# Patient Record
Sex: Female | Born: 1970 | Race: White | Hispanic: No | Marital: Single | Smoking: Current every day smoker
Health system: Southern US, Community
[De-identification: ages and names within clinical notes are randomized; demographics above are authoritative.]

## PROBLEM LIST (undated history)

## (undated) DIAGNOSIS — G459 Transient cerebral ischemic attack, unspecified: Secondary | ICD-10-CM

## (undated) DIAGNOSIS — F319 Bipolar disorder, unspecified: Secondary | ICD-10-CM

## (undated) DIAGNOSIS — I8290 Acute embolism and thrombosis of unspecified vein: Secondary | ICD-10-CM

## (undated) DIAGNOSIS — I82C19 Acute embolism and thrombosis of unspecified internal jugular vein: Secondary | ICD-10-CM

## (undated) HISTORY — PX: OTHER SURGICAL HISTORY: SHX169

## (undated) HISTORY — DX: Acute embolism and thrombosis of unspecified internal jugular vein: I82.C19

---

## 2005-12-30 ENCOUNTER — Emergency Department: Payer: Self-pay | Admitting: Emergency Medicine

## 2007-01-25 ENCOUNTER — Emergency Department: Payer: Self-pay | Admitting: Emergency Medicine

## 2008-09-12 ENCOUNTER — Emergency Department: Payer: Self-pay | Admitting: Emergency Medicine

## 2009-02-04 ENCOUNTER — Emergency Department: Payer: Self-pay | Admitting: Unknown Physician Specialty

## 2009-11-22 ENCOUNTER — Ambulatory Visit: Payer: Self-pay | Admitting: Obstetrics and Gynecology

## 2009-12-07 ENCOUNTER — Ambulatory Visit: Payer: Self-pay | Admitting: Obstetrics and Gynecology

## 2009-12-07 ENCOUNTER — Encounter (INDEPENDENT_AMBULATORY_CARE_PROVIDER_SITE_OTHER): Payer: Self-pay | Admitting: *Deleted

## 2009-12-07 LAB — CONVERTED CEMR LAB
Hemoglobin: 12 g/dL (ref 12.0–15.0)
MCHC: 32.9 g/dL (ref 30.0–36.0)
MCV: 95.3 fL (ref 78.0–100.0)
RBC: 3.83 M/uL — ABNORMAL LOW (ref 3.87–5.11)

## 2009-12-13 ENCOUNTER — Ambulatory Visit (HOSPITAL_COMMUNITY)
Admission: RE | Admit: 2009-12-13 | Discharge: 2009-12-13 | Payer: Self-pay | Source: Home / Self Care | Admitting: Family Medicine

## 2009-12-27 ENCOUNTER — Ambulatory Visit: Payer: Self-pay | Admitting: Obstetrics and Gynecology

## 2010-03-14 ENCOUNTER — Ambulatory Visit: Payer: Self-pay

## 2012-05-20 ENCOUNTER — Emergency Department: Payer: Self-pay | Admitting: Unknown Physician Specialty

## 2012-07-29 ENCOUNTER — Ambulatory Visit: Payer: Self-pay

## 2012-08-05 ENCOUNTER — Ambulatory Visit: Payer: Self-pay

## 2012-08-05 ENCOUNTER — Ambulatory Visit: Payer: Self-pay | Admitting: Family Medicine

## 2012-10-05 ENCOUNTER — Emergency Department: Payer: Self-pay | Admitting: Emergency Medicine

## 2013-07-28 ENCOUNTER — Emergency Department: Payer: Self-pay | Admitting: Emergency Medicine

## 2013-07-28 LAB — CBC
HCT: 36.8 % (ref 35.0–47.0)
HGB: 12.3 g/dL (ref 12.0–16.0)
MCH: 31.8 pg (ref 26.0–34.0)
MCHC: 33.3 g/dL (ref 32.0–36.0)
MCV: 95 fL (ref 80–100)
PLATELETS: 263 10*3/uL (ref 150–440)
RBC: 3.86 10*6/uL (ref 3.80–5.20)
RDW: 14.2 % (ref 11.5–14.5)
WBC: 5.8 10*3/uL (ref 3.6–11.0)

## 2013-07-28 LAB — BASIC METABOLIC PANEL
ANION GAP: 5 — AB (ref 7–16)
BUN: 15 mg/dL (ref 7–18)
CALCIUM: 8.9 mg/dL (ref 8.5–10.1)
CO2: 25 mmol/L (ref 21–32)
Chloride: 107 mmol/L (ref 98–107)
Creatinine: 0.91 mg/dL (ref 0.60–1.30)
GLUCOSE: 96 mg/dL (ref 65–99)
OSMOLALITY: 275 (ref 275–301)
POTASSIUM: 3.8 mmol/L (ref 3.5–5.1)
Sodium: 137 mmol/L (ref 136–145)

## 2013-07-28 LAB — TROPONIN I

## 2014-09-06 ENCOUNTER — Emergency Department
Admission: EM | Admit: 2014-09-06 | Discharge: 2014-09-06 | Disposition: A | Discharge status: Home / Self Care | Payer: Self-pay | Attending: Emergency Medicine | Admitting: Emergency Medicine

## 2014-09-06 DIAGNOSIS — F1994 Other psychoactive substance use, unspecified with psychoactive substance-induced mood disorder: Secondary | ICD-10-CM

## 2014-09-06 DIAGNOSIS — F329 Major depressive disorder, single episode, unspecified: Secondary | ICD-10-CM | POA: Insufficient documentation

## 2014-09-06 DIAGNOSIS — F1410 Cocaine abuse, uncomplicated: Secondary | ICD-10-CM | POA: Insufficient documentation

## 2014-09-06 DIAGNOSIS — Y9289 Other specified places as the place of occurrence of the external cause: Secondary | ICD-10-CM | POA: Insufficient documentation

## 2014-09-06 DIAGNOSIS — Z720 Tobacco use: Secondary | ICD-10-CM | POA: Insufficient documentation

## 2014-09-06 DIAGNOSIS — Z7289 Other problems related to lifestyle: Secondary | ICD-10-CM

## 2014-09-06 DIAGNOSIS — F1010 Alcohol abuse, uncomplicated: Secondary | ICD-10-CM

## 2014-09-06 DIAGNOSIS — Y9389 Activity, other specified: Secondary | ICD-10-CM | POA: Insufficient documentation

## 2014-09-06 DIAGNOSIS — F10920 Alcohol use, unspecified with intoxication, uncomplicated: Secondary | ICD-10-CM

## 2014-09-06 DIAGNOSIS — Z3202 Encounter for pregnancy test, result negative: Secondary | ICD-10-CM | POA: Insufficient documentation

## 2014-09-06 DIAGNOSIS — F10120 Alcohol abuse with intoxication, uncomplicated: Secondary | ICD-10-CM | POA: Insufficient documentation

## 2014-09-06 DIAGNOSIS — F341 Dysthymic disorder: Secondary | ICD-10-CM

## 2014-09-06 DIAGNOSIS — Y998 Other external cause status: Secondary | ICD-10-CM | POA: Insufficient documentation

## 2014-09-06 DIAGNOSIS — S61512A Laceration without foreign body of left wrist, initial encounter: Secondary | ICD-10-CM | POA: Insufficient documentation

## 2014-09-06 DIAGNOSIS — F32A Depression, unspecified: Secondary | ICD-10-CM

## 2014-09-06 HISTORY — DX: Bipolar disorder, unspecified: F31.9

## 2014-09-06 LAB — CBC
HEMATOCRIT: 40.1 % (ref 35.0–47.0)
Hemoglobin: 13.4 g/dL (ref 12.0–16.0)
MCH: 31.5 pg (ref 26.0–34.0)
MCHC: 33.4 g/dL (ref 32.0–36.0)
MCV: 94.3 fL (ref 80.0–100.0)
Platelets: 210 10*3/uL (ref 150–440)
RBC: 4.25 MIL/uL (ref 3.80–5.20)
RDW: 13.9 % (ref 11.5–14.5)
WBC: 6.1 10*3/uL (ref 3.6–11.0)

## 2014-09-06 LAB — COMPREHENSIVE METABOLIC PANEL
ALBUMIN: 4.3 g/dL (ref 3.5–5.0)
ALT: 17 U/L (ref 14–54)
ANION GAP: 9 (ref 5–15)
AST: 23 U/L (ref 15–41)
Alkaline Phosphatase: 54 U/L (ref 38–126)
BUN: 10 mg/dL (ref 6–20)
CALCIUM: 8.9 mg/dL (ref 8.9–10.3)
CO2: 24 mmol/L (ref 22–32)
CREATININE: 0.82 mg/dL (ref 0.44–1.00)
Chloride: 106 mmol/L (ref 101–111)
GLUCOSE: 90 mg/dL (ref 65–99)
POTASSIUM: 3.5 mmol/L (ref 3.5–5.1)
Sodium: 139 mmol/L (ref 135–145)
Total Bilirubin: 0.2 mg/dL — ABNORMAL LOW (ref 0.3–1.2)
Total Protein: 7.5 g/dL (ref 6.5–8.1)

## 2014-09-06 LAB — URINE DRUG SCREEN, QUALITATIVE (ARMC ONLY)
Amphetamines, Ur Screen: NOT DETECTED
Barbiturates, Ur Screen: NOT DETECTED
Benzodiazepine, Ur Scrn: NOT DETECTED
Cannabinoid 50 Ng, Ur ~~LOC~~: NOT DETECTED
Cocaine Metabolite,Ur ~~LOC~~: POSITIVE — AB
MDMA (Ecstasy)Ur Screen: NOT DETECTED
METHADONE SCREEN, URINE: NOT DETECTED
Opiate, Ur Screen: NOT DETECTED
PHENCYCLIDINE (PCP) UR S: NOT DETECTED
TRICYCLIC, UR SCREEN: NOT DETECTED

## 2014-09-06 LAB — ETHANOL: Alcohol, Ethyl (B): 213 mg/dL — ABNORMAL HIGH (ref <5)

## 2014-09-06 LAB — ACETAMINOPHEN LEVEL

## 2014-09-06 LAB — POCT PREGNANCY, URINE: Preg Test, Ur: NEGATIVE

## 2014-09-06 LAB — SALICYLATE LEVEL: Salicylate Lvl: <4 mg/dL (ref 2.8–30.0)

## 2014-09-06 NOTE — ED Notes (Signed)
Patient given instructions about following up with [facility] and given info sheet with hours and phone numbers as well as crisis phone numbers.  Patient verbalized that she has no intention to hurt herself at this time or in the near future.  Patient reports willingness to follow-up and receive counseling regarding depression and alcohol abuse.

## 2014-09-06 NOTE — ED Provider Notes (Signed)
[facility] Emergency Department Provider Note  ____________________________________________  Time seen: Approximately 138 AM  I have reviewed the triage vital signs and the nursing notes.   HISTORY  Chief Complaint Alcohol Intoxication  The patient is noncooperative with the history.  HPI Nicole Macdonald is a 44 y.o. female who was brought in by EMS for possible suicide attempt. The patient was found with a razor blade and multiple superficial lacerations to her left wrist. The patient was drinking alcohol this evening. She was found by her boyfriend who was given a bring her in but the patient ran into the bathroom and the police was called. The patient will not speak and is not cooperative at this time.   Past Medical History  Diagnosis Date  . Bipolar 1 disorder   . Manic depression     There are no active problems to display for this patient.   Past Surgical History  Procedure Laterality Date  . Denies      Current Outpatient Rx  Name  Route  Sig  Dispense  Refill  . clonazePAM (KLONOPIN) 2 MG tablet   Oral   Take 2 mg by mouth QID.         Marland Kitchen FLUoxetine (PROZAC) 20 MG capsule   Oral   Take 20 mg by mouth daily.           Allergies Review of patient's allergies indicates no known allergies.  No family history on file.  Social History History  Substance Use Topics  . Smoking status: Current Every Day Smoker  . Smokeless tobacco: Not on file  . Alcohol Use: Yes    Review of Systems Skin: Lacerations to left wrist   Patient not cooperative and refuses to answer questions at this time..  ____________________________________________   PHYSICAL EXAM:  VITAL SIGNS: ED Triage Vitals  Enc Vitals Group     BP 09/06/14 0150 109/85 mmHg     Pulse Rate 09/06/14 0150 88     Resp 09/06/14 0150 20     Temp 09/06/14 0150 97.3 F (36.3 C)     Temp Source 09/06/14 0150 Oral     SpO2 09/06/14 0150 99 %     Weight 09/06/14  0150 115 lb (52.164 kg)     Height 09/06/14 0150  (1.6 m)     Head Cir --      Peak Flow --      Pain Score --      Pain Loc --      Pain Edu? --      Excl. in GC? --    Constitutional: Alert . Well appearing and in no acute distress. Eyes: Conjunctivae are normal. PERRL. EOMI. Head: Atraumatic. Nose: No congestion/rhinnorhea. Mouth/Throat: Mucous membranes are moist.  Oropharynx non-erythematous. Cardiovascular: Normal rate, regular rhythm. Grossly normal heart sounds.  Good peripheral circulation. Respiratory: Normal respiratory effort.  No retractions. Lungs CTAB. Gastrointestinal: Soft and nontender. No distention. Positive bowel sounds Musculoskeletal: No lower extremity tenderness nor edema.   Neurologic:  No gross focal neurologic deficits are appreciated.  Skin:  Multiple Superficial lacerations to left wrist with some mild bleeding Psychiatric: Patient with downcast eyes not speaking and refusing to participate in history and physical exam.  ____________________________________________   LABS (all labs ordered are listed, but only abnormal results are displayed)  Labs Reviewed  URINE DRUG SCREEN, QUALITATIVE ([facility] ONLY) - Abnormal; Notable for the following:    Cocaine Metabolite,Ur Heard POSITIVE (*)  All other components within normal limits  COMPREHENSIVE METABOLIC PANEL  ETHANOL  SALICYLATE LEVEL  ACETAMINOPHEN LEVEL  CBC  POC URINE PREG, ED  POCT PREGNANCY, URINE   ____________________________________________  EKG  None ____________________________________________  RADIOLOGY  None ____________________________________________   PROCEDURES  Procedure(s) performed: None  Critical Care performed: No  ____________________________________________   INITIAL IMPRESSION / ASSESSMENT AND PLAN / ED COURSE  Pertinent labs & imaging results that were available during my care of the patient were reviewed by me and considered in my medical decision  making (see chart for details).  This is a 44 year old female who was brought in by ambulance after being found with a razor in her hand by her boyfriend. The patient will not answer questions as to whether she is suicidal but she was brought in under involuntary commitment by EMS and the police.  The patient will be evaluated by psych. ____________________________________________   FINAL CLINICAL IMPRESSION(S) / ED DIAGNOSES  Final diagnoses:  None      Rebecka Apley, MD 09/06/14 [phone]

## 2014-09-06 NOTE — BH Assessment (Signed)
Assessment Note  Nicole Macdonald is an 44 y.o. female.  "I had a breakdown". She reports, "I am on Prozac, they gave a different brand. I'm not supposed to drink with my medication."  "I had about 5 beers. I also had a fight with my best friend and I had a bad reaction. The medication makes me mean". She denied being depressed at this time, though she states she and her friend are having an argument.  She denied having auditory or visual hallucinations.  She denied suicidal and homicidal ideation or intent.  She reports drinking 5-6 beers about 2 times a week.  She is reported as slicing her wrists and had cuts on her wrists, but patient declined to talk about the cuts.   Axis I: Bipolar, mixed Axis II: Deferred Axis III:  Past Medical History  Diagnosis Date  . Bipolar 1 disorder   . Manic depression    Axis IV: problems related to legal system/crime and problems with primary support group Axis V: 11-20 some danger of hurting self or others possible OR occasionally fails to maintain minimal personal hygiene OR gross impairment in communication  Past Medical History:  Past Medical History  Diagnosis Date  . Bipolar 1 disorder   . Manic depression     Past Surgical History  Procedure Laterality Date  . Denies      Family History: No family history on file.  Social History:  reports that she has been smoking.  She does not have any smokeless tobacco history on file. She reports that she drinks alcohol. Her drug history is not on file.  Additional Social History:  Alcohol / Drug Use History of alcohol / drug use?: Yes Negative Consequences of Use:  (None reported) Withdrawal Symptoms:  (None reported) Substance #1 Name of Substance 1: Alcohol 1 - Age of First Use: 12 1 - Amount (size/oz): 5-6 beers  1 - Frequency:  2 times a week 1 - Last Use / Amount: 09/05/2014  CIWA: CIWA-Ar BP: 109/85 mmHg Pulse Rate: 88 COWS:    Allergies: No Known Allergies  Home Medications:   (Not in a hospital admission)  OB/GYN Status:  No LMP recorded.  General Assessment Data Location of Assessment: [facility] ED TTS Assessment: In system Is this a Tele or Face-to-Face Assessment?: Face-to-Face Is this an Initial Assessment or a Re-assessment for this encounter?: Initial Assessment Marital status: Single Maiden name: Ulbricht Is patient pregnant?: No Pregnancy Status: No Living Arrangements: Non-relatives/Friends (fiance) Can pt return to current living arrangement?: Yes Is patient capable of signing voluntary admission?: Yes Referral Source: MD Insurance type: Self pay  Medical Screening Exam [facility] ONLY) Medical Exam completed: Yes  Crisis Care Plan Living Arrangements: Non-relatives/Friends (fiance) Name of Psychiatrist: None Name of Therapist: None  Education Status Is patient currently in school?: No Current Grade: N/a Highest grade of school patient has completed:  (some college) Name of school: [employer] person: n/a  Risk to self with the past 6 months Suicidal Ideation: No Has patient been a risk to self within the past 6 months prior to admission? : Yes Suicidal Intent: Yes-Currently Present (Denies suicidal ideation) Has patient had any suicidal intent within the past 6 months prior to admission? : Yes Is patient at risk for suicide?: Yes Suicidal Plan?: Yes-Currently Present Has patient had any suicidal plan within the past 6 months prior to admission? : Yes Specify Current Suicidal Plan: cutting wrists Access to Means: Yes Specify Access to Suicidal Means:  Access to sharp objects What has been your use of drugs/alcohol within the last 12 months?: 5-6 beers twice a week Previous Attempts/Gestures: No (denied) How many times?: 0 Other Self Harm Risks: None reported Triggers for Past Attempts: Unknown Intentional Self Injurious Behavior: Cutting Family Suicide History: No Recent stressful life event(s): Other (Comment) (Medication  changes) Persecutory voices/beliefs?: No Depression: No Depression Symptoms:  (None reported) Substance abuse history and/or treatment for substance abuse?: No Suicide prevention information given to non-admitted patients: Not applicable (Not at this time-to be seen by psychiatrist)  Risk to Others within the past 6 months Homicidal Ideation: No Does patient have any lifetime risk of violence toward others beyond the six months prior to admission? : No Thoughts of Harm to Others: No Current Homicidal Intent: No Current Homicidal Plan: No Access to Homicidal Means: No Identified Victim: None reported History of harm to others?: No Assessment of Violence: On admission Violent Behavior Description: None reported Does patient have access to weapons?: No Criminal Charges Pending?: No Does patient have a court date: No Is patient on probation?: Yes (DUI)  Psychosis Hallucinations: None noted Delusions: None noted  Mental Status Report Appearance/Hygiene: Disheveled, In scrubs Eye Contact: Poor Motor Activity: Unremarkable Speech: Tangential Level of Consciousness: Alert Mood: Euthymic Affect: Flat Anxiety Level: None Thought Processes: Flight of Ideas Judgement: Unable to Assess Orientation: Person, Place, Situation Obsessive Compulsive Thoughts/Behaviors: None  Cognitive Functioning Concentration: Fair Appetite: Good  ADLScreening ([facility] Assessment Services) Patient's cognitive ability adequate to safely complete daily activities?: Yes Patient able to express need for assistance with ADLs?: Yes Independently performs ADLs?: Yes (appropriate for developmental age)  Prior Inpatient Therapy Prior Inpatient Therapy: No  Prior Outpatient Therapy Prior Outpatient Therapy: No Does patient have an ACCT team?: No Does patient have Intensive In-House Services?  : No Does patient have [facility] services? : No Does patient have P4CC services?: No  ADL Screening (condition at  time of admission) Patient's cognitive ability adequate to safely complete daily activities?: Yes Patient able to express need for assistance with ADLs?: Yes Independently performs ADLs?: Yes (appropriate for developmental age)       Abuse/Neglect Assessment (Assessment to be complete while patient is alone) Physical Abuse: Denies Verbal Abuse: Denies Sexual Abuse: Denies Exploitation of patient/patient's resources: Denies Self-Neglect: Denies Values / Beliefs Cultural Requests During Hospitalization: None Spiritual Requests During Hospitalization: None   Advance Directives (For Healthcare) Does patient have an advance directive?: No Would patient like information on creating an advanced directive?: No - patient declined information    Additional Information CIRT Risk: No Does patient have medical clearance?: Yes     Disposition:  Disposition Initial Assessment Completed for this Encounter: Yes Disposition of Patient: Referred to (To be seen by the psychiatrist)  On Site Evaluation by:   Reviewed with Physician:    Justice Deeds 09/06/2014 5:43 AM

## 2014-09-06 NOTE — ED Notes (Signed)

## 2014-09-06 NOTE — ED Notes (Signed)

## 2014-09-06 NOTE — ED Notes (Signed)

## 2014-09-06 NOTE — ED Notes (Signed)
BEHAVIORAL HEALTH ROUNDING Patient sleeping: YES Patient alert and oriented:Sleeping Behavior appropriate: Sleeping Describe behavior: No inappropriate or unacceptable behaviors noted at this time.  Nutrition and fluids offered: Sleeping Toileting and hygiene offered: Sleeping Sitter present: Behavioral tech rounding every 15 minutes on patient to ensure safety.  Law enforcement present: yes Patent examiner agency: [employer]

## 2014-09-06 NOTE — Progress Notes (Signed)
LCSW met with patient to find out why and how she arrived in our ER and if she is suicidal. 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 She reported she did drink alcohol and do some cocaine but stressed this is not an everyday thing and does not feel this is problematic for her. She does feel that her medications ( prozac and clonapin) need to be reviewed. She stated she has a lot of racing thoughts recently ( like brain changing channels all the time). Patient is seen by Dr Robina Ade in [place] and her next appointment is in 2 weeks. She reports she takes her medication as prescribed. She has 2 grown children who live away from home and she lives with her exhusband/ boyfriend of 25 years. They reconnected and  Has been together as a couple again for 5 years and she feels supported and loved. She reported she is not suicidal or homicidal, her boyfriend panicked when she locked herself in bathroom then passed out. He called police and she came here

## 2014-09-06 NOTE — ED Provider Notes (Addendum)
-----------------------------------------   8:06 AM on 09/06/2014 -----------------------------------------   Blood pressure 101/70, pulse 88, temperature 98.3 F (36.8 C), temperature source Oral, resp. rate 20, height  (1.6 m), weight 115 lb (52.164 kg), SpO2 98 %.  The patient had no acute events since last update.  Calm and cooperative at this time.  Disposition is pending per Psychiatry/Behavioral Medicine team recommendations.     Arnaldo Natal, MD 09/06/14 [phone] Patient cleared seen and cleared by psychiatry discharge with follow-up with her regular psychiatrist  Arnaldo Natal, MD 09/06/14 1145

## 2014-09-06 NOTE — Consult Note (Signed)
[facility] Consult   Reason for Consult:  Consult for 44 year old woman with a history of chronic depression and alcohol abuse who came in after cutting her wrists Referring Physician:  Cinda Quest Patient Identification: Nicole Macdonald MRN:  [id number] Principal Diagnosis: Substance induced mood disorder Diagnosis:   Patient Active Problem List   Diagnosis Date Noted  . Alcohol abuse [F10.10] 09/06/2014  . Substance induced mood disorder [F19.94] 09/06/2014    Total Time spent with patient: 1 hour  Subjective:   Nicole Macdonald is a 44 y.o. female patient admitted with "I had too much to drink" also "that new brand of medicine doesn't work".  HPI:  Information from the patient and the chart. Patient was brought in after cutting her wrist while intoxicated. Patient denies that she was seriously trying to kill her self. She said that she had too much to drink yesterday which was unusual for her.she also had an argument with her close friend and was feeling very stressed out. She estimates that she drank about 12 beers yesterday. Usually doesn't drink that much. The patient also states that she thinks that her current Prozac is "not working". She insists that she has been switched to a different brand of Prozac and that it doesn't feel like it's treating her depression as well as it used to. Her mood has been a little bit more labile. She's been sleeping poorly and had poor appetite. No hallucinations no psychotic symptoms. She admits also that she used cocaine 1 time yesterday.  Past psychiatric history: Patient reports 1 prior suicide attempt as a teenager and none since then. No history of inpatient psychiatric treatment. She isn't patient of Dr.Kaur in [place] and sees her regularly. Previously had felt like Prozac and Klonopin were helping but recently they seem to be less helpful than they were. She does take clonazepam 2 mg 4 times a day also though she says that she  takes less of it when she is drinking.  Substance abuse history: Intermittent alcohol abuse. She minimizes the effect of it. Doesn't really see it as being as much of active problem. No history of DTs or seizures.  Medical history: No significant ongoing medical problems  Social history: Patient lives with her fianc. Recently has had some trouble maintaining her work. Has felt more stressed out and had more fights with people who were close to her.  Family history: Positive for mood disorder and substance abuse HPI Elements:   Quality:  anxiety depression and mood lability. Severity:  severe enough to cut her wrists superficially. Timing:  when intoxicated yesterday. Duration:  transient and resolving. Context:  alcohol abuse.  Past Medical History:  Past Medical History  Diagnosis Date  . Bipolar 1 disorder   . Manic depression     Past Surgical History  Procedure Laterality Date  . Denies     Family History: No family history on file. Social History:  History  Alcohol Use  . Yes     History  Drug Use Not on file    History   Social History  . Marital Status: Single    Spouse Name: N/A  . Number of Children: N/A  . Years of Education: N/A   Social History Main Topics  . Smoking status: Current Every Day Smoker  . Smokeless tobacco: Not on file  . Alcohol Use: Yes  . Drug Use: Not on file  . Sexual Activity: Not on file   Other Topics Concern  .  None   Social History Narrative  . None   Additional Social History:    History of alcohol / drug use?: Yes Negative Consequences of Use:  (None reported) Withdrawal Symptoms:  (None reported) Name of Substance 1: Alcohol 1 - Age of First Use: 12 1 - Amount (size/oz): 5-6 beers  1 - Frequency:  2 times a week 1 - Last Use / Amount: 09/05/2014                   Allergies:  No Known Allergies  Labs:  Results for orders placed or performed during the hospital encounter of 09/06/14 (from the past 48  hour(s))  Comprehensive metabolic panel     Status: Abnormal   Collection Time: 09/06/14  2:04 AM  Result Value Ref Range   Sodium 139 135 - 145 mmol/L   Potassium 3.5 3.5 - 5.1 mmol/L   Chloride 106 101 - 111 mmol/L   CO2 24 22 - 32 mmol/L   Glucose, Bld 90 65 - 99 mg/dL   BUN 10 6 - 20 mg/dL   Creatinine, Ser 0.82 0.44 - 1.00 mg/dL   Calcium 8.9 8.9 - 10.3 mg/dL   Total Protein 7.5 6.5 - 8.1 g/dL   Albumin 4.3 3.5 - 5.0 g/dL   AST 23 15 - 41 U/L   ALT 17 14 - 54 U/L   Alkaline Phosphatase 54 38 - 126 U/L   Total Bilirubin 0.2 (L) 0.3 - 1.2 mg/dL   GFR calc non Af Amer >60 >60 mL/min   GFR calc Af Amer >60 >60 mL/min    Comment: (NOTE) The eGFR has been calculated using the CKD EPI equation. This calculation has not been validated in all clinical situations. eGFR's persistently <60 mL/min signify possible Chronic Kidney Disease.    Anion gap 9 5 - 15  Ethanol (ETOH)     Status: Abnormal   Collection Time: 09/06/14  2:04 AM  Result Value Ref Range   Alcohol, Ethyl (B) 213 (H) <5 mg/dL    Comment:        LOWEST DETECTABLE LIMIT FOR SERUM ALCOHOL IS 5 mg/dL FOR MEDICAL PURPOSES ONLY   Salicylate level     Status: None   Collection Time: 09/06/14  2:04 AM  Result Value Ref Range   Salicylate Lvl <6.3 2.8 - 30.0 mg/dL  Acetaminophen level     Status: Abnormal   Collection Time: 09/06/14  2:04 AM  Result Value Ref Range   Acetaminophen (Tylenol), Serum <10 (L) 10 - 30 ug/mL    Comment:        THERAPEUTIC CONCENTRATIONS VARY SIGNIFICANTLY. A RANGE OF 10-30 ug/mL MAY BE AN EFFECTIVE CONCENTRATION FOR MANY PATIENTS. HOWEVER, SOME ARE BEST TREATED AT CONCENTRATIONS OUTSIDE THIS RANGE. ACETAMINOPHEN CONCENTRATIONS >150 ug/mL AT 4 HOURS AFTER INGESTION AND >50 ug/mL AT 12 HOURS AFTER INGESTION ARE OFTEN ASSOCIATED WITH TOXIC REACTIONS.   CBC     Status: None   Collection Time: 09/06/14  2:04 AM  Result Value Ref Range   WBC 6.1 3.6 - 11.0 K/uL   RBC 4.25 3.80 -  5.20 MIL/uL   Hemoglobin 13.4 12.0 - 16.0 g/dL   HCT 40.1 35.0 - 47.0 %   MCV 94.3 80.0 - 100.0 fL   MCH 31.5 26.0 - 34.0 pg   MCHC 33.4 32.0 - 36.0 g/dL   RDW 13.9 11.5 - 14.5 %   Platelets 210 150 - 440 K/uL  Urine Drug Screen, Qualitative ([place] only)  Status: Abnormal   Collection Time: 09/06/14  2:52 AM  Result Value Ref Range   Tricyclic, Ur Screen NONE DETECTED NONE DETECTED   Amphetamines, Ur Screen NONE DETECTED NONE DETECTED   MDMA (Ecstasy)Ur Screen NONE DETECTED NONE DETECTED   Cocaine Metabolite,Ur [place] POSITIVE (A) NONE DETECTED   Opiate, Ur Screen NONE DETECTED NONE DETECTED   Phencyclidine (PCP) Ur S NONE DETECTED NONE DETECTED   Cannabinoid 50 Ng, Ur [place] NONE DETECTED NONE DETECTED   Barbiturates, Ur Screen NONE DETECTED NONE DETECTED   Benzodiazepine, Ur Scrn NONE DETECTED NONE DETECTED   Methadone Scn, Ur NONE DETECTED NONE DETECTED    Comment: (NOTE) 034  Tricyclics, urine               Cutoff 1000 ng/mL 200  Amphetamines, urine             Cutoff 1000 ng/mL 300  MDMA (Ecstasy), urine           Cutoff 500 ng/mL 400  Cocaine Metabolite, urine       Cutoff 300 ng/mL 500  Opiate, urine                   Cutoff 300 ng/mL 600  Phencyclidine (PCP), urine      Cutoff 25 ng/mL 700  Cannabinoid, urine              Cutoff 50 ng/mL 800  Barbiturates, urine             Cutoff 200 ng/mL 900  Benzodiazepine, urine           Cutoff 200 ng/mL 1000 Methadone, urine                Cutoff 300 ng/mL 1100 1200 The urine drug screen provides only a preliminary, unconfirmed 1300 analytical test result and should not be used for non-medical 1400 purposes. Clinical consideration and professional judgment should 1500 be applied to any positive drug screen result due to possible 1600 interfering substances. A more specific alternate chemical method 1700 must be used in order to obtain a confirmed analytical result.  1800 Gas chromato graphy / mass spectrometry (GC/MS) is the  preferred 1900 confirmatory method.   Pregnancy, urine POC     Status: None   Collection Time: 09/06/14  2:56 AM  Result Value Ref Range   Preg Test, Ur NEGATIVE NEGATIVE    Comment:        THE SENSITIVITY OF THIS METHODOLOGY IS >24 mIU/mL     Vitals: Blood pressure 113/73, pulse 88, temperature 98.3 F (36.8 C), temperature source Oral, resp. rate 20, height _0  (1.6 m), weight 52.164 kg (115 lb), SpO2 98 %.  Risk to Self: Suicidal Ideation: No Suicidal Intent: Yes-Currently Present (Denies suicidal ideation) Is patient at risk for suicide?: Yes Suicidal Plan?: Yes-Currently Present Specify Current Suicidal Plan: cutting wrists Access to Means: Yes Specify Access to Suicidal Means: Access to sharp objects What has been your use of drugs/alcohol within the last 12 months?: 5-6 beers twice a week How many times?: 0 Other Self Harm Risks: None reported Triggers for Past Attempts: Unknown Intentional Self Injurious Behavior: Cutting Risk to Others: Homicidal Ideation: No Thoughts of Harm to Others: No Current Homicidal Intent: No Current Homicidal Plan: No Access to Homicidal Means: No Identified Victim: None reported History of harm to others?: No Assessment of Violence: On admission Violent Behavior Description: None reported Does patient have access to weapons?: No Criminal Charges Pending?: No  Does patient have a court date: No Prior Inpatient Therapy: Prior Inpatient Therapy: No Prior Outpatient Therapy: Prior Outpatient Therapy: No Does patient have an ACCT team?: No Does patient have Intensive In-House Services?  : No Does patient have [facility] services? : No Does patient have [facility] services?: No  No current facility-administered medications for this encounter.   Current Outpatient Prescriptions  Medication Sig Dispense Refill  . clonazePAM (KLONOPIN) 2 MG tablet Take 2 mg by mouth QID.    Marland Kitchen FLUoxetine (PROZAC) 20 MG capsule Take 20 mg by mouth daily.       Musculoskeletal: Strength & Muscle Tone: within normal limits Gait & Station: normal Patient leans: N/A  Psychiatric Specialty Exam: Physical Exam  Constitutional: She appears well-developed and well-nourished.  HENT:  Head: Normocephalic and atraumatic.  Eyes: Conjunctivae are normal. Pupils are equal, round, and reactive to light.  Neck: Normal range of motion.  Cardiovascular: Normal heart sounds.   Respiratory: Effort normal.  GI: Soft.  Musculoskeletal: Normal range of motion.  Neurological: She is alert.  Skin: Skin is warm and dry.     Psychiatric: Her speech is normal and behavior is normal. Judgment and thought content normal. Her mood appears anxious. Cognition and memory are normal.    Review of Systems  Constitutional: Negative.   HENT: Negative.   Eyes: Negative.   Respiratory: Negative.   Cardiovascular: Negative.   Gastrointestinal: Negative.   Musculoskeletal: Negative.   Skin: Negative.   Neurological: Negative.   Psychiatric/Behavioral: Positive for depression, memory loss and substance abuse. Negative for suicidal ideas and hallucinations. The patient is nervous/anxious and has insomnia.     Blood pressure 113/73, pulse 88, temperature 98.3 F (36.8 C), temperature source Oral, resp. rate 20, height _0  (1.6 m), weight 52.164 kg (115 lb), SpO2 98 %.Body mass index is 20.38 kg/(m^2).  General Appearance: Disheveled  Eye Contact::  Good  Speech:  Normal Rate  Volume:  Normal  Mood:  Anxious  Affect:  Congruent  Thought Process:  Coherent  Orientation:  Full (Time, Place, and Person)  Thought Content:  Negative  Suicidal Thoughts:  No  Homicidal Thoughts:  No  Memory:  Immediate;   Good Recent;   Fair Remote;   Fair  Judgement:  Fair  Insight:  Fair  Psychomotor Activity:  Decreased  Concentration:  Fair  Recall:  [employer] of Knowledge:Fair  Language: Good  Akathisia:  No  Handed:  Right  AIMS (if indicated):     Assets:   Communication Skills Desire for Improvement Housing Physical Health Social Support  ADL's:  Intact  Cognition: WNL  Sleep:      Medical Decision Making: Review of Psycho-Social Stressors (1), Established Problem, Worsening (2), Review or order medicine tests (1), Review of Medication Regimen & Side Effects (2) and Review of New Medication or Change in Dosage (2)  Treatment Plan Summary: Plan patient has now sobered up and vital signs are stabilized. No tremors. No sign of oncoming seizures. She is denying any suicidal ideation and appears to be lucid and calm. He gives a history of chronic depression with some recent worsening of stress. She is able to report multiple positive things in her life. Her risk cutting does not seem to have been intended to kill her self. I don't think the patient requires inpatient hospitalization. Plan to discontinue the involuntary commitment. She can be discharged from the emergency room. She is to follow-up with her outpatient psychiatrist. No change to medicine.  Counseling about alcohol and cocaine abuse completed.  Plan:  Patient does not meet criteria for psychiatric inpatient admission. Supportive therapy provided about ongoing stressors. Discussed crisis plan, support from social network, calling 911, coming to the Emergency Department, and calling Suicide Hotline. Disposition: no new prescriptions. Patient can be discharged. Follow-up with her outpatient psychiatrist and continue current medicine and discontinue abuse of alcohol and cocaine.  Antionne Enrique 09/06/2014 9:43 PM

## 2014-09-06 NOTE — ED Notes (Signed)
BEHAVIORAL HEALTH ROUNDING Patient sleeping: Yes.   Patient alert and oriented: yes Behavior appropriate: Yes.  ; If no, describe:  Nutrition and fluids offered: yes Toileting and hygiene offered: Yes  Sitter present: yes Law enforcement present: Yes ODS Engineer, materials

## 2014-09-06 NOTE — ED Notes (Signed)
BEHAVIORAL HEALTH ROUNDING Patient sleeping: No. Patient alert and oriented: yes Behavior appropriate: Yes.  ; If no, describe:  Nutrition and fluids offered: Yes  Toileting and hygiene offered: Yes  Sitter present: yes Law enforcement present: Yes ODS security officer 

## 2014-09-06 NOTE — ED Notes (Signed)
BEHAVIORAL HEALTH ROUNDING Patient sleeping: Yes.   Patient alert and oriented: yes Behavior appropriate: Yes.  ; If no, describe:  Nutrition and fluids offered: Yes  Toileting and hygiene offered: Yes  Sitter present: yes Law enforcement present: Yes ODS security officer 

## 2014-09-06 NOTE — ED Notes (Addendum)
Pt presents to ED d/t ETOH intoxication. Pt was also noted to have several superficial lacerations to the underside of the left wrist (bleeding stopped/controlled at this time). Pt is A&O, tearful, and refusing to answer questions at this time. Pt has been dressed out in maroon scrubs.

## 2014-09-06 NOTE — ED Notes (Signed)

## 2014-10-03 ENCOUNTER — Encounter: Payer: Self-pay | Admitting: Emergency Medicine

## 2014-10-03 ENCOUNTER — Emergency Department
Admission: EM | Admit: 2014-10-03 | Discharge: 2014-10-03 | Disposition: A | Discharge status: Home / Self Care | Payer: Self-pay | Attending: Emergency Medicine | Admitting: Emergency Medicine

## 2014-10-03 DIAGNOSIS — Z79899 Other long term (current) drug therapy: Secondary | ICD-10-CM | POA: Insufficient documentation

## 2014-10-03 DIAGNOSIS — H61001 Unspecified perichondritis of right external ear: Secondary | ICD-10-CM

## 2014-10-03 DIAGNOSIS — Z720 Tobacco use: Secondary | ICD-10-CM | POA: Insufficient documentation

## 2014-10-03 MED ORDER — LIDOCAINE HCL (PF) 1 % IJ SOLN
5.0000 mL | Freq: Once | INTRAMUSCULAR | Status: DC
  Filled 2014-10-03: qty 5

## 2014-10-03 MED ORDER — CIPROFLOXACIN HCL 500 MG PO TABS: 500.0000 mg | ORAL_TABLET | Freq: Two times a day (BID) | ORAL | Status: AC

## 2014-10-03 MED ORDER — LIDOCAINE HCL (PF) 1 % IJ SOLN
INTRAMUSCULAR | Status: AC
  Filled 2014-10-03: qty 5

## 2014-10-03 MED ORDER — CIPROFLOXACIN HCL 500 MG PO TABS
500.0000 mg | ORAL_TABLET | Freq: Once | ORAL | Status: AC
  Administered 2014-10-03: 500 mg via ORAL
  Filled 2014-10-03: qty 1

## 2014-10-03 NOTE — ED Notes (Signed)
States she noticed pain and swelling to right ear a few days ago  Was able to expel some dluid from ear   And then swelling returned with increased pain behind ear and jaw line

## 2014-10-03 NOTE — ED Notes (Signed)
C/o pain and swelling with abcess to right ear x 1 week, also having pain to right side of face, denies any drainage

## 2014-10-03 NOTE — ED Provider Notes (Signed)
[facility] Emergency Department Provider Note ____________________________________________  Time seen: 1120  I have reviewed the triage vital signs and the nursing notes.  HISTORY  Chief Complaint  Facial Swelling and Otalgia  HPI Nicole Macdonald is a 44 y.o. female reports to the ED for evaluation of swelling to the right earlobe for the last week. She describes initially there was a small papule to the inside of the pinna, which seemed to be getting larger. Three days ago she took it upon herself to stick a sewing needle in the top of the pinna to relieve some of the pressure. She didadmit to a moderate amount of bloody/purulent discharge. Within a day the ear began to recover lack and swelling began. She is here today with swelling, firmness, and suspected abscess to the right earlobe.   Past Medical History  Diagnosis Date  . Bipolar 1 disorder   . Manic depression     Patient Active Problem List   Diagnosis Date Noted  . Alcohol abuse 09/06/2014  . Substance induced mood disorder 09/06/2014  . Dysthymia 09/06/2014    Past Surgical History  Procedure Laterality Date  . Denies      Current Outpatient Rx  Name  Route  Sig  Dispense  Refill  . ciprofloxacin (CIPRO) 500 MG tablet   Oral   Take 1 tablet (500 mg total) by mouth 2 (two) times daily.   20 tablet   0   . clonazePAM (KLONOPIN) 2 MG tablet   Oral   Take 2 mg by mouth QID.         Marland Kitchen FLUoxetine (PROZAC) 20 MG capsule   Oral   Take 20 mg by mouth daily.          Allergies Review of patient's allergies indicates no known allergies.  No family history on file.  Social History Social History  Substance Use Topics  . Smoking status: Current Every Day Smoker    Types: Cigarettes  . Smokeless tobacco: None  . Alcohol Use: Yes   Review of Systems  Constitutional: Negative for fever. Eyes: Negative for visual changes. ENT: Negative for sore throat. Right ear pinna with  swelling as above Cardiovascular: Negative for chest pain. Respiratory: Negative for shortness of breath. Gastrointestinal: Negative for abdominal pain, vomiting and diarrhea. Genitourinary: Negative for dysuria. Musculoskeletal: Negative for back pain. Skin: Negative for rash. Neurological: Negative for headaches, focal weakness or numbness. ____________________________________________  PHYSICAL EXAM:  VITAL SIGNS: ED Triage Vitals  Enc Vitals Group     BP 10/03/14 1041 121/79 mmHg     Pulse Rate 10/03/14 1041 85     Resp 10/03/14 1041 18     Temp 10/03/14 1041 98.7 F (37.1 C)     Temp Source 10/03/14 1041 Oral     SpO2 10/03/14 1041 98 %     Weight 10/03/14 1041 140 lb (63.504 kg)     Height 10/03/14 1041 5\' 3"  (1.6 m)     Head Cir --      Peak Flow --      Pain Score 10/03/14 1041 10     Pain Loc --      Pain Edu? --      Excl. in GC? --    Constitutional: Alert and oriented. Well appearing and in no distress. Eyes: Conjunctivae are normal. PERRL. Normal extraocular movements. Ears: Right pinna with focal cystic deformity proximally. TMs clear.    Head: Normocephalic and atraumatic.   Nose: No  congestion/rhinnorhea.   Mouth/Throat: Mucous membranes are moist.   Neck: Supple. No thyromegaly. Hematological/Lymphatic/Immunilogical: No cervical or auricular lymphadenopathy. Cardiovascular: Normal rate, regular rhythm.  Respiratory: Normal respiratory effort. No wheezes/rales/rhonchi. Gastrointestinal: Soft and nontender. No distention. Musculoskeletal: Nontender with normal range of motion in all extremities.  Neurologic:  Normal gait without ataxia. Normal speech and language. No gross focal neurologic deficits are appreciated. Skin:  Skin is warm, dry and intact. No rash noted. Psychiatric: Mood and affect are normal. Patient exhibits appropriate insight and judgment. ____________________________________________  NEEDLE ASPIRATION  Performed by:  Lissa Hoard Consent: Verbal consent obtained. Risks and benefits: risks, benefits and alternatives were discussed Type: cyst/abscess  Body area: right pinna  Anesthesia: local infiltration  Aspiration was made with a 18G needle  Local anesthetic: lidocaine 1% w/o epinephrine  Anesthetic total: 3 ml  Complexity: simple  Drainage: serosanguinous   Drainage amount: 6 ml  Ear dressed with 2x2 gauze and nose clip for pressure  Patient advised to remove after 1 hour  Patient tolerance: Patient tolerated the procedure well with no immediate complications. ____________________________________________  INITIAL IMPRESSION / ASSESSMENT AND PLAN / ED COURSE  Right ear with acute perichondritis formation due to local self-inflicted trauma. Prescription for Cipro #20 provided. Follow-up with Dr. Jenne Campus for further care. ____________________________________________  FINAL CLINICAL IMPRESSION(S) / ED DIAGNOSES  Final diagnoses:  Perichondritis of auricle, right     Lissa Hoard, PA-C 10/03/14 1538  Sharman Cheek, MD 10/04/14 [phone]

## 2014-10-03 NOTE — Discharge Instructions (Signed)
You are being treated for an infection and inflammation of the pinna (earlobe). Take the antibiotic as directed, until completely gone. Keep the wound clean, dry, and covered with antibiotic ointment. Return as needed for wound check.

## 2014-10-17 ENCOUNTER — Encounter: Payer: Self-pay | Admitting: Intensive Care

## 2014-10-17 ENCOUNTER — Emergency Department: Payer: Self-pay

## 2014-10-17 ENCOUNTER — Emergency Department
Admission: EM | Admit: 2014-10-17 | Discharge: 2014-10-17 | Disposition: A | Discharge status: Home / Self Care | Payer: Self-pay | Attending: Emergency Medicine | Admitting: Emergency Medicine

## 2014-10-17 DIAGNOSIS — R221 Localized swelling, mass and lump, neck: Secondary | ICD-10-CM | POA: Insufficient documentation

## 2014-10-17 DIAGNOSIS — Z79899 Other long term (current) drug therapy: Secondary | ICD-10-CM | POA: Insufficient documentation

## 2014-10-17 DIAGNOSIS — H61001 Unspecified perichondritis of right external ear: Secondary | ICD-10-CM | POA: Insufficient documentation

## 2014-10-17 DIAGNOSIS — Z720 Tobacco use: Secondary | ICD-10-CM | POA: Insufficient documentation

## 2014-10-17 LAB — CBC WITH DIFFERENTIAL/PLATELET
BASOS ABS: 0 10*3/uL (ref 0–0.1)
BASOS PCT: 1 %
Eosinophils Absolute: 0.2 10*3/uL (ref 0–0.7)
Eosinophils Relative: 3 %
HEMATOCRIT: 37.5 % (ref 35.0–47.0)
HEMOGLOBIN: 12.4 g/dL (ref 12.0–16.0)
Lymphocytes Relative: 30 %
Lymphs Abs: 1.4 10*3/uL (ref 1.0–3.6)
MCH: 31.5 pg (ref 26.0–34.0)
MCHC: 33 g/dL (ref 32.0–36.0)
MCV: 95.7 fL (ref 80.0–100.0)
MONO ABS: 0.3 10*3/uL (ref 0.2–0.9)
Monocytes Relative: 6 %
NEUTROS ABS: 2.7 10*3/uL (ref 1.4–6.5)
NEUTROS PCT: 60 %
Platelets: 192 10*3/uL (ref 150–440)
RBC: 3.92 MIL/uL (ref 3.80–5.20)
RDW: 13.6 % (ref 11.5–14.5)
WBC: 4.6 10*3/uL (ref 3.6–11.0)

## 2014-10-17 MED ORDER — LEVOFLOXACIN 750 MG PO TABS: 750.0000 mg | ORAL_TABLET | Freq: Every day | ORAL | Status: AC

## 2014-10-17 MED ORDER — LIDOCAINE-EPINEPHRINE (PF) 1 %-1:200000 IJ SOLN
30.0000 mL | Freq: Once | INTRAMUSCULAR | Status: AC
  Administered 2014-10-17: 30 mL
  Filled 2014-10-17: qty 30

## 2014-10-17 NOTE — ED Provider Notes (Signed)
[facility] Emergency Department Provider Note ____________________________________________  Time seen: Approximately 4:49 PM  I have reviewed the triage vital signs and the nursing notes.   HISTORY  Chief Complaint Facial Swelling   HPI Nicole Macdonald is a 44 y.o. female who presents to the emergency department for evaluation of right outer ear swelling and a nodule on the right side of the neck. She was here on 10/04/2014 for the same. At that time, she had recently poked a papule with a sewing needle which resulted in swelling and firmness. She denies other trauma to the ear. She finished the Cipro prescription. Two days ago the swelling began again. Today she noticed a firm nodule on the right side of the neck.  Past Medical History  Diagnosis Date  . Bipolar 1 disorder   . Manic depression     Patient Active Problem List   Diagnosis Date Noted  . Alcohol abuse 09/06/2014  . Substance induced mood disorder 09/06/2014  . Dysthymia 09/06/2014    Past Surgical History  Procedure Laterality Date  . Denies      Current Outpatient Rx  Name  Route  Sig  Dispense  Refill  . clonazePAM (KLONOPIN) 2 MG tablet   Oral   Take 2 mg by mouth QID.         Marland Kitchen FLUoxetine (PROZAC) 20 MG capsule   Oral   Take 20 mg by mouth daily.         Marland Kitchen levofloxacin (LEVAQUIN) 750 MG tablet   Oral   Take 1 tablet (750 mg total) by mouth daily.   7 tablet   0     Allergies Review of patient's allergies indicates no known allergies.  History reviewed. No pertinent family history.  Social History Social History  Substance Use Topics  . Smoking status: Current Every Day Smoker    Types: Cigarettes  . Smokeless tobacco: None  . Alcohol Use: Yes    Review of Systems   Constitutional: No fever/chills Eyes: No visual changes. ENT: No congestion or rhinorrhea Cardiovascular: Denies chest pain. Respiratory: Denies shortness of  breath. Gastrointestinal: No abdominal pain.  No nausea, no vomiting.  No diarrhea.  No constipation. Genitourinary: Negative for dysuria. Musculoskeletal: Negative for back pain. Skin: Right outer ear swelling and painful Neurological: Negative for headaches, focal weakness or numbness.  10-point ROS otherwise negative.  ____________________________________________   PHYSICAL EXAM:  VITAL SIGNS: ED Triage Vitals  Enc Vitals Group     BP 10/17/14 1630 93/64 mmHg     Pulse Rate 10/17/14 1630 60     Resp 10/17/14 1630 10     Temp 10/17/14 1630 98 F (36.7 C)     Temp Source 10/17/14 1630 Oral     SpO2 10/17/14 1630 98 %     Weight 10/17/14 1630 140 lb (63.504 kg)     Height 10/17/14 1630  (1.6 m)     Head Cir --      Peak Flow --      Pain Score 10/17/14 1631 10     Pain Loc --      Pain Edu? --      Excl. in GC? --     Constitutional: Alert and oriented. Well appearing and in no acute distress. Eyes: Conjunctivae are normal. PERRL. EOMI. Head: Atraumatic. Nose: No congestion/rhinnorhea. Mouth/Throat: Mucous membranes are moist.  Oropharynx non-erythematous. No oral lesions. Neck: No stridor. Cardiovascular: Normal rate, regular rhythm.  Good peripheral circulation.  Respiratory: Normal respiratory effort.  No retractions. Lungs CTAB. Gastrointestinal: Soft and nontender. No distention. No abdominal bruits.  Musculoskeletal: No lower extremity tenderness nor edema.  No joint effusions. Lymphatic: Firm nodule anterior cervical on right side. Neurologic:  Normal speech and language. No gross focal neurologic deficits are appreciated. Speech is normal. No gait instability. Skin:  Right auricle with large swollen, firm cystlike structure with fluctuance; TM clear Negative for petechiae.  Psychiatric: Mood and affect are normal. Speech and behavior are normal.  ____________________________________________   LABS (all labs ordered are listed, but only abnormal results  are displayed)  Labs Reviewed  CBC WITH DIFFERENTIAL/PLATELET   ____________________________________________  EKG   ____________________________________________  RADIOLOGY   ____________________________________________   PROCEDURES  Procedure(s) performed:  INCISION AND DRAINAGE Performed by: Kem Boroughs Consent: Verbal consent obtained. Risks and benefits: risks, benefits and alternatives were discussed Type: abscess  Body area: right auricle  Anesthesia: field block  Aspirated with 18 gauge needle.  Local anesthetic: lidocaine 1% with epinephrine  Anesthetic total: 6 ml  Drainage: serosanguaneous  Drainage amount: 5.68ml  Patient tolerance: Patient tolerated the procedure well with no immediate complications.    ____________________________________________   INITIAL IMPRESSION / ASSESSMENT AND PLAN / ED COURSE  Pertinent labs & imaging results that were available during my care of the patient were reviewed by me and considered in my medical decision making (see chart for details).  Patient was strongly advised to follow-up with Dr. Jenne Campus. She was advised to call first thing in the morning to schedule an appointment. She will take Levaquin for 7 days. She was advised to return to the emergency department for symptoms that change or worsen if she is unable schedule a plan with the specialist. ____________________________________________   FINAL CLINICAL IMPRESSION(S) / ED DIAGNOSES  Final diagnoses:  Nodule of neck  Perichondritis of auricle, right       Chinita Pester, FNP 10/17/14 1959  Minna Antis, MD 10/19/14 2056

## 2014-10-17 NOTE — Discharge Instructions (Signed)
Please call Dr. Mikey Bussing office tomorrow. It is very important that you see a ENT specialist. Take the antibiotic until finished. Return to the ER for symptoms that change or worsen if you are unable to schedule an appointment.

## 2014-10-17 NOTE — ED Notes (Signed)
Patient has hematoma on R upper ear X 1 week. Patient was seen here two weeks ago for same issue. Ear was drained and told to come back if swelling occurred again

## 2014-12-20 ENCOUNTER — Encounter: Payer: Self-pay | Admitting: Emergency Medicine

## 2014-12-20 ENCOUNTER — Emergency Department
Admission: EM | Admit: 2014-12-20 | Discharge: 2014-12-20 | Disposition: A | Discharge status: Home / Self Care | Payer: Self-pay | Attending: Student | Admitting: Student

## 2014-12-20 DIAGNOSIS — H61001 Unspecified perichondritis of right external ear: Secondary | ICD-10-CM | POA: Insufficient documentation

## 2014-12-20 DIAGNOSIS — Z79899 Other long term (current) drug therapy: Secondary | ICD-10-CM | POA: Insufficient documentation

## 2014-12-20 DIAGNOSIS — F17210 Nicotine dependence, cigarettes, uncomplicated: Secondary | ICD-10-CM | POA: Insufficient documentation

## 2014-12-20 MED ORDER — LIDOCAINE HCL (PF) 1 % IJ SOLN
5.0000 mL | Freq: Once | INTRAMUSCULAR | Status: DC
  Filled 2014-12-20: qty 5

## 2014-12-20 MED ORDER — TRAMADOL HCL 50 MG PO TABS: 50.0000 mg | ORAL_TABLET | Freq: Four times a day (QID) | ORAL | Status: AC | PRN

## 2014-12-20 MED ORDER — LEVOFLOXACIN 750 MG PO TABS: 750.0000 mg | ORAL_TABLET | Freq: Every day | ORAL | Status: AC

## 2014-12-20 NOTE — Discharge Instructions (Signed)
YOU MUST CALL ENT TODAY FOR A FOLLOW UP APPOINTMENT DO NOT ATTEMPT TO DRAIN THIS AT HOME APPLY WARM COMPRESSES TO THE AREA ALTERNATE MOTRIN AND ULTRAM AS NEEDED FOR PAIN

## 2014-12-20 NOTE — ED Provider Notes (Signed)
CSN: [id number]     Arrival date & time 12/20/14  1255 History   First MD Initiated Contact with Patient 12/20/14 1331     Chief Complaint  Patient presents with  . Otalgia    HPI Comments: 44 year old female presents today complaining of swelling and pain to her right ear. She has been seen here twice for the same and was diagnosed with perichondritis, advised to follow up with ENT, Dr. Jenne Campus. She has failed to do this both times. Last visits were 10/03/14 and 10/17/14. She attempted to drain the swelling on her own and it got worse. No fevers, sweats or chills. Pain is radiating into her jaw.  The history is provided by the patient.    Past Medical History  Diagnosis Date  . Bipolar 1 disorder (HCC)   . Manic depression (HCC)    Past Surgical History  Procedure Laterality Date  . Denies     No family history on file. Social History  Substance Use Topics  . Smoking status: Current Every Day Smoker    Types: Cigarettes  . Smokeless tobacco: None  . Alcohol Use: Yes   OB History    No data available     Review of Systems  Constitutional: Negative for fever and chills.  HENT: Positive for ear pain.   Skin: Positive for wound.  All other systems reviewed and are negative.     Allergies  Review of patient's allergies indicates no known allergies.  Home Medications   Prior to Admission medications   Medication Sig Start Date End Date Taking? Authorizing Provider  clonazePAM (KLONOPIN) 2 MG tablet Take 2 mg by mouth QID.    Historical Provider, MD  FLUoxetine (PROZAC) 20 MG capsule Take 20 mg by mouth daily.    Historical Provider, MD  levofloxacin (LEVAQUIN) 750 MG tablet Take 1 tablet (750 mg total) by mouth daily. 12/20/14 01/09/15  Luvenia Redden, PA-C  traMADol (ULTRAM) 50 MG tablet Take 1 tablet (50 mg total) by mouth every 6 (six) hours as needed. 12/20/14 12/20/15  Wilber Oliphant V, PA-C   BP 116/63 mmHg  Pulse 81  Temp(Src) 98.5 F (36.9 C) (Oral)  Resp 18   Ht  (1.6 m)  Wt 130 lb (58.968 kg)  BMI 23.03 kg/m2  SpO2 98% Physical Exam  Constitutional: Vital signs are normal. She appears well-developed and well-nourished. She is active.  Non-toxic appearance. She does not have a sickly appearance. She does not appear ill.  HENT:  Head: Normocephalic and atraumatic.  Ears:  Mouth/Throat: Uvula is midline, oropharynx is clear and moist and mucous membranes are normal.  Eyes: Conjunctivae and EOM are normal. Pupils are equal, round, and reactive to light.  Neck: Normal range of motion. Neck supple.  Lymphadenopathy:    She has no cervical adenopathy.  Neurological: She is alert.  Nursing note and vitals reviewed.   ED Course  .Marland KitchenIncision and Drainage Date/Time: 12/20/2014 2:19 PM Performed by: Luvenia Redden Authorized by: Luvenia Redden Consent: Verbal consent obtained. Risks and benefits: risks, benefits and alternatives were discussed Consent given by: patient Patient understanding: patient states understanding of the procedure being performed Patient consent: the patient's understanding of the procedure matches consent given Procedure consent: procedure consent matches procedure scheduled Relevant documents: relevant documents present and verified Patient identity confirmed: verbally with patient Time out: Immediately prior to procedure a "time out" was called to verify the correct patient, procedure, equipment, support staff and site/side marked as required.  Type: hematoma Body area: head Location details: right external ear Anesthesia: local infiltration Local anesthetic: lidocaine 1% without epinephrine Anesthetic total: 3 ml Patient sedated: no Needle gauge: 18 Complexity: simple Drainage: bloody Drainage amount: scant Wound treatment: wound left open Packing material: 1/4 in gauze Patient tolerance: Patient tolerated the procedure well with no immediate complications   (including critical care time) Labs  Review Labs Reviewed - No data to display  Imaging Review No results found. I have personally reviewed and evaluated these images and lab results as part of my medical decision-making.   EKG Interpretation None      MDM  Aspiration of hematomas versus infection was unsuccessful. Explained to patient it is imperative that she see otolaryntology, because this would likely require operative treatment that we cannot provide her in the emergency room. Also strongly discouraged her from performing I&Ds at home on herself. Levaquin 750mg  daily for 10 days. Referral provided to [facility] for further care. Follow up if acutely worsening prior to ENT appointment  Final diagnoses:  Perichondritis of auricle, right        Wilber OliphantEmma Weavil V, PA-C 12/20/14 1422  Gayla DossEryka A Gayle, MD 12/21/14 1322

## 2014-12-20 NOTE — ED Notes (Signed)
Developed pain and swelling to right ear   Swelling noted to ear lobe and radiates into neck

## 2014-12-20 NOTE — ED Notes (Signed)
AAOx3.  Skin warm and dry.  NAD 

## 2014-12-27 ENCOUNTER — Telehealth: Payer: Self-pay | Admitting: Emergency Medicine

## 2014-12-27 NOTE — ED Notes (Signed)
Patient called to ask about what medicine was put in ear during er visit.  She says she has a positive uds for cocaine and wanted to know if the ear numbing med would cause that.  i told her that i was not sure if the uds would be affected by the lidocaine--i asked if she had listed all meds for the people doing the uds.    She wrote down the lidocaine so she can inform them.

## 2015-01-09 ENCOUNTER — Ambulatory Visit: Payer: Self-pay

## 2015-03-22 ENCOUNTER — Emergency Department: Payer: Self-pay

## 2015-03-22 ENCOUNTER — Emergency Department
Admission: EM | Admit: 2015-03-22 | Discharge: 2015-03-22 | Disposition: A | Discharge status: Home / Self Care | Payer: Self-pay | Attending: Emergency Medicine | Admitting: Emergency Medicine

## 2015-03-22 ENCOUNTER — Encounter: Payer: Self-pay | Admitting: *Deleted

## 2015-03-22 DIAGNOSIS — R2231 Localized swelling, mass and lump, right upper limb: Secondary | ICD-10-CM | POA: Insufficient documentation

## 2015-03-22 DIAGNOSIS — R0602 Shortness of breath: Secondary | ICD-10-CM | POA: Insufficient documentation

## 2015-03-22 DIAGNOSIS — M7989 Other specified soft tissue disorders: Secondary | ICD-10-CM

## 2015-03-22 DIAGNOSIS — F17210 Nicotine dependence, cigarettes, uncomplicated: Secondary | ICD-10-CM | POA: Insufficient documentation

## 2015-03-22 DIAGNOSIS — Z79899 Other long term (current) drug therapy: Secondary | ICD-10-CM | POA: Insufficient documentation

## 2015-03-22 DIAGNOSIS — R079 Chest pain, unspecified: Secondary | ICD-10-CM

## 2015-03-22 LAB — CBC
HCT: 37.9 % (ref 35.0–47.0)
HEMOGLOBIN: 12.6 g/dL (ref 12.0–16.0)
MCH: 31.5 pg (ref 26.0–34.0)
MCHC: 33.3 g/dL (ref 32.0–36.0)
MCV: 94.4 fL (ref 80.0–100.0)
PLATELETS: 179 10*3/uL (ref 150–440)
RBC: 4.01 MIL/uL (ref 3.80–5.20)
RDW: 13.6 % (ref 11.5–14.5)
WBC: 4.2 10*3/uL (ref 3.6–11.0)

## 2015-03-22 LAB — FIBRIN DERIVATIVES D-DIMER (ARMC ONLY): FIBRIN DERIVATIVES D-DIMER (ARMC): 713 — AB (ref 0–499)

## 2015-03-22 LAB — BASIC METABOLIC PANEL
ANION GAP: 8 (ref 5–15)
BUN: 9 mg/dL (ref 6–20)
CALCIUM: 9.4 mg/dL (ref 8.9–10.3)
CO2: 27 mmol/L (ref 22–32)
CREATININE: 0.72 mg/dL (ref 0.44–1.00)
Chloride: 106 mmol/L (ref 101–111)
Glucose, Bld: 105 mg/dL — ABNORMAL HIGH (ref 65–99)
Potassium: 3.9 mmol/L (ref 3.5–5.1)
Sodium: 141 mmol/L (ref 135–145)

## 2015-03-22 LAB — TROPONIN I: Troponin I: <0.03 ng/mL (ref <0.031)

## 2015-03-22 MED ORDER — OXYCODONE-ACETAMINOPHEN 5-325 MG PO TABS
1.0000 | ORAL_TABLET | Freq: Once | ORAL | Status: AC
  Administered 2015-03-22: 1 via ORAL
  Filled 2015-03-22: qty 1

## 2015-03-22 MED ORDER — CARISOPRODOL 350 MG PO TABS: 350.0000 mg | ORAL_TABLET | Freq: Three times a day (TID) | ORAL | Status: DC | PRN

## 2015-03-22 NOTE — ED Notes (Signed)
States she has not felt well for the past few days, states chest pain and that it is hard to catch her breathe today, also states some right hand swelling, pt states 6/10 pain

## 2015-03-22 NOTE — ED Provider Notes (Addendum)
[facility] Emergency Department Provider Note  ____________________________________________  Time seen: Approximately 4:30 PM  I have reviewed the triage vital signs and the nursing notes.   HISTORY  Chief Complaint Chest Pain and Shortness of Breath    HPI Nicole Macdonald is a 45 y.o. female with a history of bipolar disorder and substance abuse who is presenting today with shortness of breath over the past 2 days as well as chest pain that started this morning. She says that she had a runny nose and difficulty breathing over the past 2 days which has since improved. She thinks it is related to allergies. However, she began having a 6 out of 10 chest pain early this morning which is nonradiating and central on the chest. She describes it as a squeezing pain that is constant. She says it is not worsened with deep breathing but there is soreness with movement. She says that she also has some swelling to her right hand especially to the index as well as middle fingers. She has pain to the dorsum of the index finger when she flexes it. She denies any injury. She is right-hand dominant. 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   Past Medical History  Diagnosis Date  . Bipolar 1 disorder (HCC)   . Manic depression (HCC)     Patient Active Problem List   Diagnosis Date Noted  . Alcohol abuse 09/06/2014  . Substance induced mood disorder (HCC) 09/06/2014  . Dysthymia 09/06/2014    Past Surgical History  Procedure Laterality Date  . Denies      Current Outpatient Rx  Name  Route  Sig  Dispense  Refill  . clonazePAM (KLONOPIN) 2 MG tablet   Oral   Take 2 mg by mouth 4 (four) times daily.          Marland Kitchen FLUoxetine (PROZAC) 20 MG capsule   Oral   Take 20 mg by mouth daily.         . traMADol (ULTRAM) 50 MG tablet   Oral   Take 1 tablet (50 mg total) by mouth every 6 (six) hours as  needed. Patient not taking: Reported on 03/22/2015   20 tablet   0     Allergies Review of patient's allergies indicates no known allergies.  History reviewed. No pertinent family history.  Social History Social History  Substance Use Topics  . Smoking status: Current Every Day Smoker    Types: Cigarettes  . Smokeless tobacco: None  . Alcohol Use: Yes    Review of Systems Constitutional: No fever/chills Eyes: No visual changes. ENT: No sore throat. Cardiovascular: As above Respiratory: As above. Gastrointestinal: No abdominal pain.   no vomiting.  No diarrhea.  No constipation. Genitourinary: Negative for dysuria. Musculoskeletal: Negative for back pain. Skin: Negative for rash. Neurological: Negative for headaches, focal weakness or numbness.  10-point ROS otherwise negative.  ____________________________________________   PHYSICAL EXAM:  VITAL SIGNS: ED Triage Vitals  Enc Vitals Group     BP 03/22/15 1342 112/69 mmHg     Pulse Rate 03/22/15 1342 77     Resp 03/22/15 1342 18     Temp 03/22/15 1342 98.1 F (36.7 C)     Temp Source 03/22/15 1342 Oral     SpO2 03/22/15 1342 98 %     Weight 03/22/15 1342 135 lb (61.236 kg)     Height 03/22/15 1342 5'  3" (1.6 m)     Head Cir --      Peak Flow --      Pain Score 03/22/15 1342 6     Pain Loc --      Pain Edu? --      Excl. in GC? --     Constitutional: Alert and oriented. Well appearing and in no acute distress. Eyes: Conjunctivae are normal. PERRL. EOMI. Head: Atraumatic. Nose: No congestion/rhinnorhea. Mouth/Throat: Mucous membranes are moist.  Oropharynx non-erythematous. Neck: No stridor.   Cardiovascular: Normal rate, regular rhythm. Grossly normal heart sounds.  Good peripheral circulation with equal and bilateral dorsalis pedis as well as radial pulses. Chest pain is reproducible palpation. Respiratory: Normal respiratory effort.  No retractions. Lungs CTAB. Gastrointestinal: Soft and nontender. No  distention. No abdominal bruits. No CVA tenderness. Musculoskeletal: No lower extremity tenderness nor edema.  No joint effusions. Right index finger with mild concentric swelling which is not tense. Similar exam to the right middle finger. Not pitting edema. No sausagelike digit. No tenderness along the flexor sheath. No deformity. Neurologic:  Normal speech and language. No gross focal neurologic deficits are appreciated. No gait instability. Skin:  Skin is warm, dry and intact. No rash noted. Psychiatric: Mood and affect are normal. Speech and behavior are normal.  ____________________________________________   LABS (all labs ordered are listed, but only abnormal results are displayed)  Labs Reviewed  BASIC METABOLIC PANEL - Abnormal; Notable for the following:    Glucose, Bld 105 (*)    All other components within normal limits  FIBRIN DERIVATIVES D-DIMER ([facility] ONLY) - Abnormal; Notable for the following:    Fibrin derivatives D-dimer [facility]) 713 (*)    All other components within normal limits  CBC  TROPONIN I  TROPONIN I   ____________________________________________  EKG  ED ECG REPORT I, Arelia Longest, the attending physician, personally viewed and interpreted this ECG.   Date: 03/22/2015  EKG Time: 1340  Rate: 80  Rhythm: normal sinus rhythm  Axis: Normal axis  Intervals:none  ST&T Change: No ST segment elevation or depression. No abnormal T-wave inversion.  ____________________________________________  RADIOLOGY  Normal chest x-ray. ____________________________________________   PROCEDURES   ____________________________________________   INITIAL IMPRESSION / ASSESSMENT AND PLAN / ED COURSE  Pertinent labs & imaging results that were available during my care of the patient were reviewed by me and considered in my medical decision making (see chart for details).  PERC negative but slightly higher suspicion for DVT in the right upper extremity  because of the swelling. We'll send d-dimer. Denies any family history of cardiac disease. Heart score of 3 which puts the patient into the low risk category.    ----------------------------------------- 7:24 PM on 03/22/2015 -----------------------------------------  Patient with slightly positive d-dimer. We'll ultrasound her RIGHT UPPER EXTREMITY. SAYS HER CHEST PAIN IS RELIEVED AFTER PERCOCET.  ----------------------------------------- 7:51 PM on 03/22/2015 -----------------------------------------  Patient updated about negative DVT ultrasound the right upper extremity. She says that her chest is feeling tight at this time but does not hurt to breathe. I offered her a CAT scan of the chest but she would like to try anti-inflammatory medications at home and see if the pain improves. She is not having any shortness of breath at this time. Her vital signs and the normal throughout her stay. She has no edema to the right forearm or arm. It is only to the fingers. She also has some signs of arthritis in the hand. I do have  a low suspicion at this time for DVT and PE. The chest pain was also reproducible palpation. It is possible this is all musculoskeletal related. She knows to return at any time if she would like a CAT scan. She is aware that we've not completely rule out a blood clot at this time. I spent a her and her accompanying party. She'll be discharged home. ____________________________________________   FINAL CLINICAL IMPRESSION(S) / ED DIAGNOSES  Final diagnoses:  Swelling of right upper extremity   chest pain.  Myrna Blazer, MD 03/22/15 1953  Pt PERC negative, but pursued the ddimer bc of the extremity/finger swelling.   Myrna Blazer, MD 03/22/15 [phone]

## 2016-04-26 ENCOUNTER — Emergency Department
Admission: EM | Admit: 2016-04-26 | Discharge: 2016-04-26 | Disposition: A | Discharge status: Home / Self Care | Payer: Self-pay | Attending: Emergency Medicine | Admitting: Emergency Medicine

## 2016-04-26 DIAGNOSIS — F17210 Nicotine dependence, cigarettes, uncomplicated: Secondary | ICD-10-CM | POA: Insufficient documentation

## 2016-04-26 DIAGNOSIS — J0101 Acute recurrent maxillary sinusitis: Secondary | ICD-10-CM | POA: Insufficient documentation

## 2016-04-26 DIAGNOSIS — Z79899 Other long term (current) drug therapy: Secondary | ICD-10-CM | POA: Insufficient documentation

## 2016-04-26 MED ORDER — BENZONATATE 100 MG PO CAPS: 100.0000 mg | ORAL_CAPSULE | Freq: Three times a day (TID) | ORAL | 0 refills | Status: DC | PRN

## 2016-04-26 MED ORDER — AZITHROMYCIN 250 MG PO TABS: ORAL_TABLET | ORAL | 0 refills | Status: DC

## 2016-04-26 MED ORDER — FLUTICASONE PROPIONATE 50 MCG/ACT NA SUSP: 2.0000 | Freq: Every day | NASAL | 0 refills | Status: DC

## 2016-04-26 NOTE — Discharge Instructions (Signed)
Begin taking Zithromax today along with Flonase nasal spray and Tessalon as needed for cough. You may take Tylenol as needed for sinus pain or headache. Increase fluids. Decrease smoking if possible. [facility] or Dr. Andee PolesVaught  if any continued problems with your sinuses.

## 2016-04-26 NOTE — ED Triage Notes (Addendum)
Pt states while she was at work she was having a sore throat and coughed up a few chunks of blood. Pt states she feels like her throat is sore and has had an increased cough over the past week. Denies any fever or chills.

## 2016-04-26 NOTE — ED Notes (Signed)
See triage note  States she has had a sore throat for a couple of days   But noticed some blood when she coughed this am   Afebrile on arrival

## 2016-04-26 NOTE — ED Provider Notes (Signed)
[facility] Emergency Department Provider Note   ____________________________________________   First MD Initiated Contact with Patient 04/26/16 1306     (approximate)  I have reviewed the triage vital signs and the nursing notes.   HISTORY  Chief Complaint Cough and Sore Throat    HPI Nicole Macdonald is a 46 y.o. female is here complaining of cough. Patient states that she feels that her throat is sore has gotten worse over the last 2 days. Patient has had a cough for over one week. Patient denies any fever or chills. She has had a history of sinus infections. Patient coughed while at work and saw some blood in the sputum and was told to go to the emergency room. She has not taken any over-the-counter medications for her cough. She denies any fever, chills, nausea or vomiting. Patient has been a smoker for over 30 years.   Past Medical History:  Diagnosis Date  . Bipolar 1 disorder (HCC)   . Manic depression (HCC)     Patient Active Problem List   Diagnosis Date Noted  . Alcohol abuse 09/06/2014  . Substance induced mood disorder (HCC) 09/06/2014  . Dysthymia 09/06/2014    Past Surgical History:  Procedure Laterality Date  . denies      Prior to Admission medications   Medication Sig Start Date End Date Taking? Authorizing Provider  amphetamine-dextroamphetamine (ADDERALL) 20 MG tablet Take 20 mg by mouth daily.   Yes Historical Provider, MD  azithromycin (ZITHROMAX Z-PAK) 250 MG tablet Take 2 tablets (500 mg) on  Day 1,  followed by 1 tablet (250 mg) once daily on Days 2 through 5. 04/26/16   Tommi Rumpshonda L Summers, PA-C  benzonatate (TESSALON PERLES) 100 MG capsule Take 1 capsule (100 mg total) by mouth 3 (three) times daily as needed for cough. 04/26/16 04/26/17  Tommi Rumpshonda L Summers, PA-C  clonazePAM (KLONOPIN) 2 MG tablet Take 2 mg by mouth 4 (four) times daily.     Historical Provider, MD  FLUoxetine (PROZAC) 20 MG capsule Take 20 mg by mouth  daily.    Historical Provider, MD  fluticasone (FLONASE) 50 MCG/ACT nasal spray Place 2 sprays into both nostrils daily. 04/26/16 04/26/17  Tommi Rumpshonda L Summers, PA-C    Allergies Patient has no known allergies.  No family history on file.  Social History Social History  Substance Use Topics  . Smoking status: Current Every Day Smoker    Types: Cigarettes  . Smokeless tobacco: Not on file  . Alcohol use Yes    Review of Systems Constitutional: No fever/chills Eyes: No visual changes. ENT: Positive sore throat. Cardiovascular: Denies chest pain. Respiratory: Denies shortness of breath. Positive cough. Gastrointestinal: No abdominal pain.  No nausea, no vomiting.   Musculoskeletal: Negative for back pain. Skin: Negative for rash. Neurological: Negative for headaches, focal weakness or numbness.  10-point ROS otherwise negative.  ____________________________________________   PHYSICAL EXAM:  VITAL SIGNS: ED Triage Vitals  Enc Vitals Group     BP 04/26/16 1205 (!) 141/71     Pulse Rate 04/26/16 1205 88     Resp 04/26/16 1205 18     Temp 04/26/16 1205 98.1 F (36.7 C)     Temp src --      SpO2 04/26/16 1205 98 %     Weight 04/26/16 1205 140 lb (63.5 kg)     Height 04/26/16 1205 5\' 3"  (1.6 m)     Head Circumference --      Peak  Flow --      Pain Score 04/26/16 1209 5     Pain Loc --      Pain Edu? --      Excl. in GC? --     Constitutional: Alert and oriented. Well appearing and in no acute distress. Eyes: Conjunctivae are normal. PERRL. EOMI. Head: Atraumatic. Nose: Minimal congestion/rhinnorhea.  EACs are clear bilaterally. TMs are dull but no erythema or injection is noted. Moderate tenderness on percussion of the left maxillary sinus. Mouth/Throat: Mucous membranes are moist.  Oropharynx non-erythematous. Neck: No stridor.   Hematological/Lymphatic/Immunilogical: No cervical lymphadenopathy. Cardiovascular: Normal rate, regular rhythm. Grossly normal heart  sounds.  Good peripheral circulation. Respiratory: Normal respiratory effort.  No retractions. Lungs CTAB. Gastrointestinal: Soft and nontender. No distention. Musculoskeletal: No lower extremity tenderness nor edema.  No joint effusions. Neurologic:  Normal speech and language. No gross focal neurologic deficits are appreciated. No gait instability. Skin:  Skin is warm, dry and intact. No rash noted. Psychiatric: Mood and affect are normal. Speech and behavior are normal.  ____________________________________________   LABS (all labs ordered are listed, but only abnormal results are displayed)  Labs Reviewed - No data to display  PROCEDURES  Procedure(s) performed: None  Procedures  Critical Care performed: No  ____________________________________________   INITIAL IMPRESSION / ASSESSMENT AND PLAN / ED COURSE  Pertinent labs & imaging results that were available during my care of the patient were reviewed by me and considered in my medical decision making (see chart for details).  Discussed patient pulling the history of smoking. Patient is to follow-up with [facility]  clinic or Dr. Andee Poles at [facility]NT if any continued problems. She is placed on Zithromax for the next 5 days along with Tessalon Perles as needed for cough and Flonase nasal spray 2 sprays each meters once a day. She also is encouraged to drink lots of fluids. She is encouraged to decrease smoking if possible as she is smoking 1 pack cigarettes per day. Patient is follow-up with she continues to have  hemoptysis      ____________________________________________   FINAL CLINICAL IMPRESSION(S) / ED DIAGNOSES  Final diagnoses:  Acute recurrent maxillary sinusitis      NEW MEDICATIONS STARTED DURING THIS VISIT:  Discharge Medication List as of 04/26/2016  1:19 PM    START taking these medications   Details  azithromycin (ZITHROMAX Z-PAK) 250 MG tablet Take 2 tablets (500 mg) on  Day 1,  followed by 1  tablet (250 mg) once daily on Days 2 through 5., Print    benzonatate (TESSALON PERLES) 100 MG capsule Take 1 capsule (100 mg total) by mouth 3 (three) times daily as needed for cough., Starting Fri 04/26/2016, Until Sat 04/26/2017, Print    fluticasone (FLONASE) 50 MCG/ACT nasal spray Place 2 sprays into both nostrils daily., Starting Fri 04/26/2016, Until Sat 04/26/2017, Print         Note:  This document was prepared using Dragon voice recognition software and may include unintentional dictation errors.    Tommi Rumps, PA-C 04/26/16 1353    Myrna Blazer, MD 04/26/16 [phone]

## 2017-02-14 ENCOUNTER — Emergency Department: Payer: Self-pay

## 2017-02-14 ENCOUNTER — Other Ambulatory Visit: Payer: Self-pay

## 2017-02-14 ENCOUNTER — Encounter: Payer: Self-pay | Admitting: Emergency Medicine

## 2017-02-14 ENCOUNTER — Emergency Department
Admission: EM | Admit: 2017-02-14 | Discharge: 2017-02-14 | Disposition: A | Discharge status: Home / Self Care | Payer: Self-pay | Attending: Emergency Medicine | Admitting: Emergency Medicine

## 2017-02-14 DIAGNOSIS — J209 Acute bronchitis, unspecified: Secondary | ICD-10-CM | POA: Insufficient documentation

## 2017-02-14 DIAGNOSIS — F17210 Nicotine dependence, cigarettes, uncomplicated: Secondary | ICD-10-CM | POA: Insufficient documentation

## 2017-02-14 DIAGNOSIS — R55 Syncope and collapse: Secondary | ICD-10-CM

## 2017-02-14 DIAGNOSIS — R1031 Right lower quadrant pain: Secondary | ICD-10-CM | POA: Insufficient documentation

## 2017-02-14 DIAGNOSIS — F1410 Cocaine abuse, uncomplicated: Secondary | ICD-10-CM | POA: Insufficient documentation

## 2017-02-14 DIAGNOSIS — J40 Bronchitis, not specified as acute or chronic: Secondary | ICD-10-CM

## 2017-02-14 DIAGNOSIS — Z79899 Other long term (current) drug therapy: Secondary | ICD-10-CM | POA: Insufficient documentation

## 2017-02-14 LAB — URINALYSIS, COMPLETE (UACMP) WITH MICROSCOPIC
Bacteria, UA: NONE SEEN
Bilirubin Urine: NEGATIVE
Glucose, UA: NEGATIVE mg/dL
Hgb urine dipstick: NEGATIVE
Ketones, ur: NEGATIVE mg/dL
Leukocytes, UA: NEGATIVE
Nitrite: NEGATIVE
PH: 7 (ref 5.0–8.0)
Protein, ur: NEGATIVE mg/dL
SPECIFIC GRAVITY, URINE: 1.021 (ref 1.005–1.030)
Squamous Epithelial / LPF: NONE SEEN
WBC, UA: NONE SEEN WBC/hpf (ref 0–5)

## 2017-02-14 LAB — POCT PREGNANCY, URINE: PREG TEST UR: NEGATIVE

## 2017-02-14 LAB — COMPREHENSIVE METABOLIC PANEL
ALBUMIN: 4.2 g/dL (ref 3.5–5.0)
ALK PHOS: 56 U/L (ref 38–126)
ALT: 13 U/L — AB (ref 14–54)
AST: 22 U/L (ref 15–41)
Anion gap: 8 (ref 5–15)
BUN: 16 mg/dL (ref 6–20)
CALCIUM: 9.1 mg/dL (ref 8.9–10.3)
CHLORIDE: 108 mmol/L (ref 101–111)
CO2: 24 mmol/L (ref 22–32)
CREATININE: 0.78 mg/dL (ref 0.44–1.00)
GFR calc Af Amer: >60 mL/min (ref >60)
GFR calc non Af Amer: >60 mL/min (ref >60)
GLUCOSE: 109 mg/dL — AB (ref 65–99)
Potassium: 4.1 mmol/L (ref 3.5–5.1)
SODIUM: 140 mmol/L (ref 135–145)
Total Bilirubin: 0.4 mg/dL (ref 0.3–1.2)
Total Protein: 7 g/dL (ref 6.5–8.1)

## 2017-02-14 LAB — ETHANOL: Alcohol, Ethyl (B): <10 mg/dL (ref <10)

## 2017-02-14 LAB — URINE DRUG SCREEN, QUALITATIVE (ARMC ONLY)
Amphetamines, Ur Screen: NOT DETECTED
Barbiturates, Ur Screen: NOT DETECTED
Benzodiazepine, Ur Scrn: NOT DETECTED
CANNABINOID 50 NG, UR ~~LOC~~: NOT DETECTED
COCAINE METABOLITE, UR ~~LOC~~: POSITIVE — AB
MDMA (ECSTASY) UR SCREEN: NOT DETECTED
Methadone Scn, Ur: NOT DETECTED
Opiate, Ur Screen: NOT DETECTED
Phencyclidine (PCP) Ur S: NOT DETECTED
TRICYCLIC, UR SCREEN: NOT DETECTED

## 2017-02-14 LAB — LIPASE, BLOOD: Lipase: 28 U/L (ref 11–51)

## 2017-02-14 LAB — CBC
HCT: 39.7 % (ref 35.0–47.0)
Hemoglobin: 13.2 g/dL (ref 12.0–16.0)
MCH: 31.3 pg (ref 26.0–34.0)
MCHC: 33.4 g/dL (ref 32.0–36.0)
MCV: 93.7 fL (ref 80.0–100.0)
PLATELETS: 198 10*3/uL (ref 150–440)
RBC: 4.24 MIL/uL (ref 3.80–5.20)
RDW: 13.9 % (ref 11.5–14.5)
WBC: 4.1 10*3/uL (ref 3.6–11.0)

## 2017-02-14 LAB — HCG, QUANTITATIVE, PREGNANCY: hCG, Beta Chain, Quant, S: 1 m[IU]/mL (ref <5)

## 2017-02-14 LAB — TROPONIN I

## 2017-02-14 MED ORDER — SODIUM CHLORIDE 0.9 % IV BOLUS (SEPSIS)
1000.0000 mL | Freq: Once | INTRAVENOUS | Status: AC
  Administered 2017-02-14: 1000 mL via INTRAVENOUS

## 2017-02-14 MED ORDER — ALBUTEROL SULFATE HFA 108 (90 BASE) MCG/ACT IN AERS: 2.0000 | INHALATION_SPRAY | Freq: Four times a day (QID) | RESPIRATORY_TRACT | 2 refills | Status: DC | PRN

## 2017-02-14 MED ORDER — PREDNISONE 20 MG PO TABS: 60.0000 mg | ORAL_TABLET | Freq: Every day | ORAL | 0 refills | Status: AC

## 2017-02-14 MED ORDER — IPRATROPIUM-ALBUTEROL 0.5-2.5 (3) MG/3ML IN SOLN
3.0000 mL | Freq: Once | RESPIRATORY_TRACT | Status: AC
  Administered 2017-02-14: 3 mL via RESPIRATORY_TRACT
  Filled 2017-02-14: qty 3

## 2017-02-14 NOTE — ED Triage Notes (Addendum)
Pt states she passed out this morning, pt states she had just woken up and getting ready for work.  Pt states she blacked out.  Pt states she didn't feel right when she got up this morning.  Pt states she had tightness in her chest this morning as well. Pt also reports abdominal pain in RUQ that started on Wednesday which she states is a stabbing pain and is intermittent. Pain is minimal at this time.

## 2017-02-14 NOTE — ED Provider Notes (Signed)
[facility] Emergency Department Provider Note  ____________________________________________  Time seen: Approximately 1:10 PM  I have reviewed the triage vital signs and the nursing notes.   HISTORY  Chief Complaint Loss of Consciousness   HPI Nicole Macdonald is a 47 y.o. female the history of bipolar disorder, alcohol abuse who presents for evaluation of syncopal episode. Patient reports that she woke up this morning not feeling well. She made her coffee and went outside of her apartment. When she was walking back inside she felt dizzy like she was going to pass out. She was able to brace herself but had a brief episode of LOC lasting a few seconds. She denies injuring herself. Patient reports 3 days of a productive cough and mild constant shortness of breath. She reports that she feels similar to prior episode of pneumonia she had in the past. No chest pain, no fever or chills. Patient also reports that she has had right-sided abdominal pain that she describes as sharp/stabbing, intermittent, for the last 2 days. Last had pain yesterday. No abdominal pain today. No nausea, vomiting, diarrhea, dysuria, hematuria. Patient denies any prior abdominal surgeries. No vaginal discharge or bleeding. Patient denies chest pain or palpitations.She denies drug use. She reports occasional alcohol use but none in the last 2 days.  Past Medical History:  Diagnosis Date  . Bipolar 1 disorder (HCC)   . Manic depression (HCC)     Patient Active Problem List   Diagnosis Date Noted  . Alcohol abuse 09/06/2014  . Substance induced mood disorder (HCC) 09/06/2014  . Dysthymia 09/06/2014    Past Surgical History:  Procedure Laterality Date  . denies      Prior to Admission medications   Medication Sig Start Date End Date Taking? Authorizing Provider  albuterol (PROVENTIL HFA;VENTOLIN HFA) 108 (90 Base) MCG/ACT inhaler Inhale 2 puffs into the lungs every 6 (six) hours as  needed for wheezing or shortness of breath. 02/14/17   Nita SickleVeronese, [place], MD  amphetamine-dextroamphetamine (ADDERALL) 20 MG tablet Take 20 mg by mouth daily.    [provider]  azithromycin (ZITHROMAX Z-PAK) 250 MG tablet Take 2 tablets (500 mg) on  Day 1,  followed by 1 tablet (250 mg) once daily on Days 2 through 5. Patient not taking: Reported on 02/14/2017 04/26/16   Tommi RumpsSummers, Rhonda L, PA-C  benzonatate (TESSALON PERLES) 100 MG capsule Take 1 capsule (100 mg total) by mouth 3 (three) times daily as needed for cough. Patient not taking: Reported on 02/14/2017 04/26/16 04/26/17  Tommi RumpsSummers, Rhonda L, PA-C  clonazePAM (KLONOPIN) 2 MG tablet Take 2 mg by mouth 4 (four) times daily.     [provider]  FLUoxetine (PROZAC) 20 MG capsule Take 20 mg by mouth daily.    [provider]  fluticasone (FLONASE) 50 MCG/ACT nasal spray Place 2 sprays into both nostrils daily. Patient not taking: Reported on 02/14/2017 04/26/16 04/26/17  Tommi RumpsSummers, Rhonda L, PA-C  predniSONE (DELTASONE) 20 MG tablet Take 3 tablets (60 mg total) by mouth daily for 4 days. 02/14/17 02/18/17  Nita SickleVeronese, [place], MD    Allergies Patient has no known allergies.  History reviewed. No pertinent family history.  Social History Social History   Tobacco Use  . Smoking status: Current Every Day Smoker    Packs/day: 0.50    Types: Cigarettes  . Smokeless tobacco: Never Used  Substance Use Topics  . Alcohol use: Yes  . Drug use: Not on file    Review of  Systems  Constitutional: Negative for fever. + syncope Eyes: Negative for visual changes. ENT: Negative for sore throat. Neck: No neck pain  Cardiovascular: Negative for chest pain. Respiratory: + shortness of breath and cough Gastrointestinal: + R sided abdominal pain. No vomiting or diarrhea. Genitourinary: Negative for dysuria. Musculoskeletal: Negative for back pain. Skin: Negative for rash. Neurological: Negative for headaches, weakness or  numbness. Psych: No SI or HI  ____________________________________________   PHYSICAL EXAM:  VITAL SIGNS: ED Triage Vitals  Enc Vitals Group     BP 02/14/17 1045 (!) 125/94     Pulse Rate 02/14/17 1045 100     Resp 02/14/17 1045 16     Temp 02/14/17 1045 98.1 F (36.7 C)     Temp Source 02/14/17 1045 Oral     SpO2 02/14/17 1045 100 %     Weight 02/14/17 1046 140 lb (63.5 kg)     Height 02/14/17 1046 5\' 3"  (1.6 m)     Head Circumference --      Peak Flow --      Pain Score 02/14/17 1050 5     Pain Loc --      Pain Edu? --      Excl. in GC? --     Constitutional: Alert and oriented. Well appearing and in no apparent distress. HEENT:      Head: Normocephalic and atraumatic.         Eyes: Conjunctivae are normal. Sclera is non-icteric.       Mouth/Throat: Mucous membranes are dry.       Neck: Supple with no signs of meningismus. No C-spine tenderness Cardiovascular: Regular rate and rhythm. No murmurs, gallops, or rubs. 2+ symmetrical distal pulses are present in all extremities. No JVD. Respiratory: Normal respiratory effort. Lungs are clear to auscultation bilaterally with diminished air movement. No wheezes, crackles, or rhonchi.  Gastrointestinal: Soft, non tender, and non distended with positive bowel sounds. No rebound or guarding. Musculoskeletal: Nontender with normal range of motion in all extremities. No edema, cyanosis, or erythema of extremities. Neurologic: Normal speech and language. Face is symmetric. Moving all extremities. No gross focal neurologic deficits are appreciated. Skin: Skin is warm, dry and intact. No rash noted. Psychiatric: Mood and affect are normal. Speech and behavior are normal.  ____________________________________________   LABS (all labs ordered are listed, but only abnormal results are displayed)  Labs Reviewed  URINALYSIS, COMPLETE (UACMP) WITH MICROSCOPIC - Abnormal; Notable for the following components:      Result Value   Color,  Urine YELLOW (*)    APPearance TURBID (*)    All other components within normal limits  COMPREHENSIVE METABOLIC PANEL - Abnormal; Notable for the following components:   Glucose, Bld 109 (*)    ALT 13 (*)    All other components within normal limits  URINE DRUG SCREEN, QUALITATIVE ([facility] ONLY) - Abnormal; Notable for the following components:   Cocaine Metabolite,Ur [place] POSITIVE (*)    All other components within normal limits  CBC  LIPASE, BLOOD  HCG, QUANTITATIVE, PREGNANCY  ETHANOL  TROPONIN I  POCT PREGNANCY, URINE   ____________________________________________  EKG  ED ECG REPORT I, Nita Sickle, the attending physician, personally viewed and interpreted this ECG.  Normal sinus rhythm, normal intervals, RAD, no STE or depressions, no evidence of HOCM, AV block, delta wave, ARVD, prolonged QTc, WPW, or Brugada.   ____________________________________________  RADIOLOGY  CXR: negative  ____________________________________________   PROCEDURES  Procedure(s) performed: None Procedures Critical Care performed:  None ____________________________________________   INITIAL IMPRESSION / ASSESSMENT AND PLAN / ED COURSE  47 y.o. female the history of bipolar disorder, alcohol abuse who presents for evaluation of several medical complaints.  # cough and SOB: Patient smokes 0.5ppd and has diminished air movement bilaterally. Will give duoneb. Check CXR to rule out PNA  # syncope: Patient looks dry on exam. Syncope preceded by lightheadedness, no injuries. Will check orthostatics, will give IVF. EKG and no evidence of ischemia or dysrhythmia. We'll check hCG to rule out pregnancy. We'll check labs to rule out dehydration.  # abdominal pain: Patient reports 2 days of intermittent right-sided abdominal pain which has now resolved. No pain at this time. Abdomen is soft with no tenderness throughout. We'll check CMP and lipase, pregnancy test, and urinalysis.     _________________________ 2:42 PM on 02/14/2017 -----------------------------------------  Chest x-ray no evidence of pneumonia, patient remains afebrile with normal white count. Most likely bronchitis for which she will be treated with steroids and albuterol. CMP, urinalysis, hCG, and lipase all within normal limits. Patient remains with no abdominal pain and no tenderness on exam. Orthostatic vital signs were negative. Patient reports feeling markedly improved after fluids. Her tox screen is positive for cocaine. Discussed return precautions with patient. I'm referring patient to [facility] clinic for outpatient follow up. Patient stable for discharge at this time and outpatient follow up.   As part of my medical decision making, I reviewed the following data within the electronic MEDICAL RECORD NUMBER Notes from prior ED visits and [place] Controlled Substance Database    Pertinent labs & imaging results that were available during my care of the patient were reviewed by me and considered in my medical decision making (see chart for details).    ____________________________________________   FINAL CLINICAL IMPRESSION(S) / ED DIAGNOSES  Final diagnoses:  Bronchitis  Syncope, unspecified syncope type  Cocaine abuse (HCC)      NEW MEDICATIONS STARTED DURING THIS VISIT:  ED Discharge Orders        Ordered    albuterol (PROVENTIL HFA;VENTOLIN HFA) 108 (90 Base) MCG/ACT inhaler  Every 6 hours PRN     02/14/17 1441    predniSONE (DELTASONE) 20 MG tablet  Daily     02/14/17 1441       Note:  This document was prepared using Dragon voice recognition software and may include unintentional dictation errors.    Nita Sickle, MD 02/14/17 [phone]

## 2017-02-14 NOTE — ED Notes (Signed)
Patient transported to X-ray 

## 2017-02-14 NOTE — ED Notes (Signed)
Pt alert and oriented X4, active, cooperative, pt in NAD. RR even and unlabored, color WNL.  Pt informed to return if any life threatening symptoms occur.  Discharge and followup instructions reviewed.  

## 2017-02-14 NOTE — ED Notes (Signed)
Pt awoke today and "did not feel right". Pt states she stood up and had syncopal episode. Witnessed episode, pt states she caught herself and did not fall. Pt states she felt similar episode while at work. Coworker brought her to ER. Pt has not had syncopal episodes in past.

## 2017-04-18 ENCOUNTER — Inpatient Hospital Stay
Admission: EM | Admit: 2017-04-18 | Discharge: 2017-04-20 | DRG: 482 | Disposition: A | Discharge status: Home Health | Payer: Self-pay | Attending: Internal Medicine | Admitting: Internal Medicine

## 2017-04-18 ENCOUNTER — Encounter: Payer: Self-pay | Admitting: *Deleted

## 2017-04-18 ENCOUNTER — Other Ambulatory Visit: Payer: Self-pay

## 2017-04-18 ENCOUNTER — Emergency Department: Payer: Self-pay

## 2017-04-18 DIAGNOSIS — E669 Obesity, unspecified: Secondary | ICD-10-CM | POA: Diagnosis present

## 2017-04-18 DIAGNOSIS — R45851 Suicidal ideations: Secondary | ICD-10-CM

## 2017-04-18 DIAGNOSIS — S72009A Fracture of unspecified part of neck of unspecified femur, initial encounter for closed fracture: Secondary | ICD-10-CM | POA: Diagnosis present

## 2017-04-18 DIAGNOSIS — S61411A Laceration without foreign body of right hand, initial encounter: Secondary | ICD-10-CM | POA: Diagnosis present

## 2017-04-18 DIAGNOSIS — S72002A Fracture of unspecified part of neck of left femur, initial encounter for closed fracture: Secondary | ICD-10-CM

## 2017-04-18 DIAGNOSIS — Z6826 Body mass index (BMI) 26.0-26.9, adult: Secondary | ICD-10-CM

## 2017-04-18 DIAGNOSIS — S72012A Unspecified intracapsular fracture of left femur, initial encounter for closed fracture: Principal | ICD-10-CM | POA: Diagnosis present

## 2017-04-18 DIAGNOSIS — Z8781 Personal history of (healed) traumatic fracture: Secondary | ICD-10-CM

## 2017-04-18 DIAGNOSIS — W010XXA Fall on same level from slipping, tripping and stumbling without subsequent striking against object, initial encounter: Secondary | ICD-10-CM | POA: Diagnosis present

## 2017-04-18 DIAGNOSIS — F319 Bipolar disorder, unspecified: Secondary | ICD-10-CM | POA: Diagnosis present

## 2017-04-18 DIAGNOSIS — F1410 Cocaine abuse, uncomplicated: Secondary | ICD-10-CM | POA: Diagnosis present

## 2017-04-18 DIAGNOSIS — F1210 Cannabis abuse, uncomplicated: Secondary | ICD-10-CM | POA: Diagnosis present

## 2017-04-18 DIAGNOSIS — Y92511 Restaurant or cafe as the place of occurrence of the external cause: Secondary | ICD-10-CM

## 2017-04-18 DIAGNOSIS — F17210 Nicotine dependence, cigarettes, uncomplicated: Secondary | ICD-10-CM | POA: Diagnosis present

## 2017-04-18 DIAGNOSIS — Y990 Civilian activity done for income or pay: Secondary | ICD-10-CM

## 2017-04-18 DIAGNOSIS — F1010 Alcohol abuse, uncomplicated: Secondary | ICD-10-CM | POA: Diagnosis present

## 2017-04-18 LAB — COMPREHENSIVE METABOLIC PANEL
ALT: 16 U/L (ref 14–54)
AST: 24 U/L (ref 15–41)
Albumin: 4 g/dL (ref 3.5–5.0)
Alkaline Phosphatase: 52 U/L (ref 38–126)
Anion gap: 7 (ref 5–15)
BILIRUBIN TOTAL: 0.4 mg/dL (ref 0.3–1.2)
BUN: 16 mg/dL (ref 6–20)
CHLORIDE: 107 mmol/L (ref 101–111)
CO2: 26 mmol/L (ref 22–32)
CREATININE: 0.78 mg/dL (ref 0.44–1.00)
Calcium: 8.9 mg/dL (ref 8.9–10.3)
Glucose, Bld: 84 mg/dL (ref 65–99)
Potassium: 3.5 mmol/L (ref 3.5–5.1)
Sodium: 140 mmol/L (ref 135–145)
TOTAL PROTEIN: 6.8 g/dL (ref 6.5–8.1)

## 2017-04-18 LAB — CBC WITH DIFFERENTIAL/PLATELET
BASOS ABS: 0.1 10*3/uL (ref 0–0.1)
BASOS PCT: 1 %
EOS PCT: 3 %
Eosinophils Absolute: 0.2 10*3/uL (ref 0–0.7)
HCT: 34.9 % — ABNORMAL LOW (ref 35.0–47.0)
Hemoglobin: 11.9 g/dL — ABNORMAL LOW (ref 12.0–16.0)
LYMPHS ABS: 1.4 10*3/uL (ref 1.0–3.6)
Lymphocytes Relative: 27 %
MCH: 31.4 pg (ref 26.0–34.0)
MCHC: 34 g/dL (ref 32.0–36.0)
MCV: 92.4 fL (ref 80.0–100.0)
Monocytes Absolute: 0.4 10*3/uL (ref 0.2–0.9)
Monocytes Relative: 8 %
NEUTROS PCT: 61 %
Neutro Abs: 3.2 10*3/uL (ref 1.4–6.5)
PLATELETS: 198 10*3/uL (ref 150–440)
RBC: 3.77 MIL/uL — AB (ref 3.80–5.20)
RDW: 13.6 % (ref 11.5–14.5)
WBC: 5.3 10*3/uL (ref 3.6–11.0)

## 2017-04-18 LAB — PROTIME-INR
INR: 0.82
PROTHROMBIN TIME: 11.2 s — AB (ref 11.4–15.2)

## 2017-04-18 LAB — APTT: APTT: 26 s (ref 24–36)

## 2017-04-18 MED ORDER — ONDANSETRON HCL 4 MG/2ML IJ SOLN
4.0000 mg | Freq: Once | INTRAMUSCULAR | Status: AC
  Administered 2017-04-18: 4 mg via INTRAVENOUS

## 2017-04-18 MED ORDER — ONDANSETRON HCL 4 MG/2ML IJ SOLN
INTRAMUSCULAR | Status: AC
  Administered 2017-04-18: 4 mg via INTRAVENOUS
  Filled 2017-04-18: qty 2

## 2017-04-18 MED ORDER — HYDROCODONE-ACETAMINOPHEN 5-325 MG PO TABS
1.0000 | ORAL_TABLET | Freq: Four times a day (QID) | ORAL | Status: DC | PRN
  Administered 2017-04-19: 2 via ORAL
  Filled 2017-04-18: qty 2

## 2017-04-18 MED ORDER — FLUOXETINE HCL 20 MG PO CAPS
20.0000 mg | ORAL_CAPSULE | Freq: Every day | ORAL | Status: DC
  Administered 2017-04-20: 20 mg via ORAL
  Filled 2017-04-18 (×2): qty 1

## 2017-04-18 MED ORDER — MORPHINE SULFATE (PF) 2 MG/ML IV SOLN: 0.5000 mg | INTRAVENOUS | Status: DC | PRN

## 2017-04-18 MED ORDER — LIDOCAINE-EPINEPHRINE-TETRACAINE (LET) SOLUTION
NASAL | Status: AC
  Administered 2017-04-18: 3 mL via TOPICAL
  Filled 2017-04-18: qty 3

## 2017-04-18 MED ORDER — LIDOCAINE-EPINEPHRINE-TETRACAINE (LET) SOLUTION
3.0000 mL | Freq: Once | NASAL | Status: AC
  Administered 2017-04-18: 3 mL via TOPICAL
  Filled 2017-04-18: qty 3

## 2017-04-18 MED ORDER — MORPHINE SULFATE (PF) 4 MG/ML IV SOLN
4.0000 mg | Freq: Once | INTRAVENOUS | Status: AC
  Administered 2017-04-18: 4 mg via INTRAVENOUS

## 2017-04-18 MED ORDER — AMPHETAMINE-DEXTROAMPHETAMINE 5 MG PO TABS
20.0000 mg | ORAL_TABLET | Freq: Every day | ORAL | Status: DC
  Administered 2017-04-20: 20 mg via ORAL
  Filled 2017-04-18: qty 4

## 2017-04-18 MED ORDER — CLONAZEPAM 0.5 MG PO TABS
2.0000 mg | ORAL_TABLET | Freq: Four times a day (QID) | ORAL | Status: DC
  Administered 2017-04-19 – 2017-04-20 (×4): 2 mg via ORAL
  Filled 2017-04-18 (×5): qty 4

## 2017-04-18 MED ORDER — ALBUTEROL SULFATE (2.5 MG/3ML) 0.083% IN NEBU: 2.5000 mg | INHALATION_SOLUTION | Freq: Four times a day (QID) | RESPIRATORY_TRACT | Status: DC | PRN

## 2017-04-18 MED ORDER — CEFAZOLIN SODIUM-DEXTROSE 2-4 GM/100ML-% IV SOLN
2.0000 g | INTRAVENOUS | Status: DC
  Filled 2017-04-18: qty 100

## 2017-04-18 MED ORDER — MORPHINE SULFATE (PF) 4 MG/ML IV SOLN
INTRAVENOUS | Status: AC
  Administered 2017-04-18: 4 mg via INTRAVENOUS
  Filled 2017-04-18: qty 1

## 2017-04-18 NOTE — ED Notes (Signed)
Noel RN, aware of bed assigned  

## 2017-04-18 NOTE — ED Triage Notes (Signed)
Pt says the floor at work was grease coated and she slipped and fell onto the left side. She was carrying a plate that broke. She hit head her head on the wall.  She c/o pain in the left hip area, no n/v or LOC, lac to the right hand (bleeding controlled). Took 400 ibuprofen PTA.

## 2017-04-18 NOTE — ED Notes (Signed)
Dr. Juliette AlcideMelinda in room, closed laceration to rt. Hand with surgical glue.

## 2017-04-18 NOTE — H&P (Signed)
[facility] [place] at [facility]   PATIENT NAME: Nicole Macdonald    MR#:  [id number]  DATE OF BIRTH:  05-Aug-1970  DATE OF ADMISSION:  04/18/2017  PRIMARY CARE PHYSICIAN: Patient, No Pcp Per   REQUESTING/REFERRING PHYSICIAN:   CHIEF COMPLAINT:   Chief Complaint  Patient presents with  . Fall    HISTORY OF PRESENT ILLNESS: Nicole Macdonald  is a 47 y.o. female with a known history of pulmonary disorder, tobacco, cocaine, marijuana and alcohol abuse. Patient was brought to emergency room status post mechanical fall at work.  Patient says she accidentally slipped and fell on her left side.  She complains of severe left hip pain with range of motion of left hip joint.  She took 400 mg Motrin right after she fell and this helped somewhat with her pain.  She denies loss of consciousness but she did bump her head during the fall.  No nausea, vomiting, no fever/chills. Blood test done in emergency room including CBC and BMP are essentially within normal limits.  The urine drug test screen is positive for cocaine, opiates, marijuana and benzodiazepines. Left hip x-ray, reviewed by myself, confirms left subcapital femoral neck fracture with impaction of the superolateral aspect of the femoral neck on femoral head.  Patient is otherwise, physically active and able to walk more than 5 blocks without any chest pain.  Telemetry shows normal sinus rhythm without any ST-T changes. Patient is admitted for further treatment.  PAST MEDICAL HISTORY:   Past Medical History:  Diagnosis Date  . Bipolar 1 disorder (HCC)   . Manic depression (HCC)     PAST SURGICAL HISTORY:  Past Surgical History:  Procedure Laterality Date  . denies      SOCIAL HISTORY:  Social History   Tobacco Use  . Smoking status: Current Every Day Smoker    Packs/day: 0.50    Types: Cigarettes  . Smokeless tobacco: Never Used  Substance Use Topics  . Alcohol use: Yes    FAMILY HISTORY: No  family history on file.  DRUG ALLERGIES: No Known Allergies  REVIEW OF SYSTEMS:   CONSTITUTIONAL: No fever, fatigue or weakness.  EYES: No blurred or double vision.  EARS, NOSE, AND THROAT: No tinnitus or ear pain.  RESPIRATORY: No cough, shortness of breath, wheezing or hemoptysis.  CARDIOVASCULAR: No chest pain, orthopnea, edema.  GASTROINTESTINAL: No nausea, vomiting, diarrhea or abdominal pain.  GENITOURINARY: No dysuria, hematuria.  ENDOCRINE: No polyuria, nocturia,  HEMATOLOGY: No bleeding SKIN: No rash or lesion. MUSCULOSKELETAL: Positive for left hip pain NEUROLOGIC: No focal weakness.  PSYCHIATRY: Positive history for bipolar disorder.  MEDICATIONS AT HOME:  Prior to Admission medications   Medication Sig Start Date End Date Taking? Authorizing Provider  albuterol (PROVENTIL HFA;VENTOLIN HFA) 108 (90 Base) MCG/ACT inhaler Inhale 2 puffs into the lungs every 6 (six) hours as needed for wheezing or shortness of breath. 02/14/17  Yes Veronese, [place], MD  amphetamine-dextroamphetamine (ADDERALL) 20 MG tablet Take 20 mg by mouth daily.   Yes [provider]  clonazePAM (KLONOPIN) 2 MG tablet Take 2 mg by mouth 4 (four) times daily.    Yes [provider]  FLUoxetine (PROZAC) 20 MG capsule Take 20 mg by mouth daily.   Yes [provider]  ibuprofen (ADVIL,MOTRIN) 600 MG tablet Take 600 mg by mouth. 04/07/17  Yes [provider]  Lidocaine 4 % PTCH Place onto the skin. 04/07/17  Yes [provider]  azithromycin (ZITHROMAX Z-PAK) 250  MG tablet Take 2 tablets (500 mg) on  Day 1,  followed by 1 tablet (250 mg) once daily on Days 2 through 5. Patient not taking: Reported on 02/14/2017 04/26/16   Tommi Rumps, PA-C  benzonatate (TESSALON PERLES) 100 MG capsule Take 1 capsule (100 mg total) by mouth 3 (three) times daily as needed for cough. Patient not taking: Reported on 02/14/2017 04/26/16 04/26/17  Tommi Rumps, PA-C  fluticasone  [facility]) 50 MCG/ACT nasal spray Place 2 sprays into both nostrils daily. Patient not taking: Reported on 02/14/2017 04/26/16 04/26/17  Tommi Rumps, PA-C      PHYSICAL EXAMINATION:   VITAL SIGNS: Blood pressure 117/83, pulse 98, temperature 98.3 F (36.8 C), temperature source Oral, resp. rate 17, height 5\' 3"  (1.6 m), weight 63.5 kg (140 lb), SpO2 93 %.  GENERAL:  47 y.o.-year-old patient lying in the bed, in moderate distress, secondary to left hip pain.  EYES: Pupils equal, round, reactive to light and accommodation. No scleral icterus. Extraocular muscles intact.  HEENT: Head atraumatic, normocephalic. Oropharynx and nasopharynx clear.  NECK:  Supple, no jugular venous distention. No thyroid enlargement, no tenderness.  LUNGS: Reduced breath sounds bilaterally, no wheezing, rales,rhonchi or crepitation. No use of accessory muscles of respiration.  CARDIOVASCULAR: S1, S2 normal. No S3/S4.  ABDOMEN: Soft, nontender, nondistended. Bowel sounds present. No organomegaly or mass.  EXTREMITIES: Left lower extremity is noted to be shortened and externally rotated.  NEUROLOGIC: No focal weakness.  Gait not checked, as patient is not able to ambulate.  PSYCHIATRIC: The patient is alert and oriented x 3.  SKIN: No obvious rash, lesion, or ulcer.   LABORATORY PANEL:   CBC Recent Labs  Lab 04/18/17 2253  WBC 5.3  HGB 11.9*  HCT 34.9*  PLT 198  MCV 92.4  MCH 31.4  MCHC 34.0  RDW 13.6  LYMPHSABS 1.4  MONOABS 0.4  EOSABS 0.2  BASOSABS 0.1   ------------------------------------------------------------------------------------------------------------------  Chemistries  No results for input(s): NA, K, CL, CO2, GLUCOSE, BUN, CREATININE, CALCIUM, MG, AST, ALT, ALKPHOS, BILITOT in the last 168 hours.  Invalid input(s): GFRCGP ------------------------------------------------------------------------------------------------------------------ CrCl cannot be calculated (Patient's most  recent lab result is older than the maximum 21 days allowed.). ------------------------------------------------------------------------------------------------------------------ No results for input(s): TSH, T4TOTAL, T3FREE, THYROIDAB in the last 72 hours.  Invalid input(s): FREET3   Coagulation profile No results for input(s): INR, PROTIME in the last 168 hours. ------------------------------------------------------------------------------------------------------------------- No results for input(s): DDIMER in the last 72 hours. -------------------------------------------------------------------------------------------------------------------  Cardiac Enzymes No results for input(s): CKMB, TROPONINI, MYOGLOBIN in the last 168 hours.  Invalid input(s): CK ------------------------------------------------------------------------------------------------------------------ Invalid input(s): POCBNP  ---------------------------------------------------------------------------------------------------------------  Urinalysis    Component Value Date/Time   COLORURINE YELLOW (A) 02/14/2017 1322   APPEARANCEUR TURBID (A) 02/14/2017 1322   LABSPEC 1.021 02/14/2017 1322   PHURINE 7.0 02/14/2017 1322   GLUCOSEU NEGATIVE 02/14/2017 1322   HGBUR NEGATIVE 02/14/2017 1322   BILIRUBINUR NEGATIVE 02/14/2017 1322   KETONESUR NEGATIVE 02/14/2017 1322   PROTEINUR NEGATIVE 02/14/2017 1322   NITRITE NEGATIVE 02/14/2017 1322   LEUKOCYTESUR NEGATIVE 02/14/2017 1322     RADIOLOGY: Dg Hip Unilat With Pelvis 2-3 Views Left  Result Date: 04/18/2017 CLINICAL DATA:  Patient slipped and fell onto left side and presents with left hip pain. EXAM: DG HIP (WITH OR WITHOUT PELVIS) 2-3V LEFT COMPARISON:  None. FINDINGS: Subcapital left femoral neck impaction fracture with slight valgus angulation of the left proximal femur is noted. No joint dislocation. The bony pelvis appears intact. There  is lower lumbar facet  arthropathy. No soft tissue mass or mineralization. IMPRESSION: Left subcapital femoral neck fracture with impaction of the superolateral aspect of the femoral neck on femoral head. Electronically Signed   By: Tollie Ethavid  Kwon M.D.   On: 04/18/2017 22:38    EKG: Orders placed or performed during the hospital encounter of 04/18/17  . ED EKG  . ED EKG    IMPRESSION AND PLAN:  1.  Left hip fracture, status post mechanical fall.  Orthopedics consulted for possible surgical repair.  Based on her functional capacity and fairly normal EKG, this patient is at acceptable surgical risk.  She should undergo surgical repair for her left hip fracture.  We will continue to monitor patient clinically closely and watch for withdrawal symptoms.  Continue pain management. 2.  Polysubstance abuse.  Will monitor for withdrawal symptoms.  3.  Tobacco abuse.  Smoking cessation was discussed with patient in detail.  Will use nicotine patch during the hospital stay.  4.  Bipolar disorder, currently clinically stable.  Will restart home medications.   All the records are reviewed and case discussed with ED provider. Management plans discussed with the patient, family and they are in agreement.  CODE STATUS: Code Status History    This patient does not have a recorded code status. Please follow your organizational policy for patients in this situation.       TOTAL TIME TAKING CARE OF THIS PATIENT: 40 minutes.    Cammy CopaAngela Elfida Shimada M.D on 04/18/2017 at 11:30 PM  Between 7am to 6pm - Pager - [phone]  After 6pm go to [email or web address] - password [email or web address]  Fabio Ne[facility] [place] [facility]  Office  [phone]  CC: Primary care physician; Patient, No Pcp Per

## 2017-04-18 NOTE — ED Notes (Signed)
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

## 2017-04-18 NOTE — ED Provider Notes (Signed)
[facility] Emergency Department Provider Note   ____________________________________________   First MD Initiated Contact with Patient 04/18/17 2237     (approximate)  I have reviewed the triage vital signs and the nursing notes.   HISTORY  Chief Complaint Fall    HPI Nicole Macdonald is a 47 y.o. female Who slipped at work and fell on her side. She did hit her head. She did not pass out headache now is mild she is not nauseated she is not vomiting she does have a lot of pain in the left hip. She says she took 400 Motrin before she got here in she doesn't have any pain if she doesn't move but it hurts severely if he does move   Past Medical History:  Diagnosis Date  . Bipolar 1 disorder (HCC)   . Manic depression (HCC)     Patient Active Problem List   Diagnosis Date Noted  . Alcohol abuse 09/06/2014  . Substance induced mood disorder (HCC) 09/06/2014  . Dysthymia 09/06/2014    Past Surgical History:  Procedure Laterality Date  . denies      Prior to Admission medications   Medication Sig Start Date End Date Taking? Authorizing Provider  albuterol (PROVENTIL HFA;VENTOLIN HFA) 108 (90 Base) MCG/ACT inhaler Inhale 2 puffs into the lungs every 6 (six) hours as needed for wheezing or shortness of breath. 02/14/17   Nita SickleVeronese, Wagner, MD  amphetamine-dextroamphetamine (ADDERALL) 20 MG tablet Take 20 mg by mouth daily.    [provider]  azithromycin (ZITHROMAX Z-PAK) 250 MG tablet Take 2 tablets (500 mg) on  Day 1,  followed by 1 tablet (250 mg) once daily on Days 2 through 5. Patient not taking: Reported on 02/14/2017 04/26/16   Tommi RumpsSummers, Rhonda L, PA-C  benzonatate (TESSALON PERLES) 100 MG capsule Take 1 capsule (100 mg total) by mouth 3 (three) times daily as needed for cough. Patient not taking: Reported on 02/14/2017 04/26/16 04/26/17  Tommi RumpsSummers, Rhonda L, PA-C  clonazePAM (KLONOPIN) 2 MG tablet Take 2 mg by mouth 4 (four) times daily.      [provider]  FLUoxetine (PROZAC) 20 MG capsule Take 20 mg by mouth daily.    [provider]  fluticasone (FLONASE) 50 MCG/ACT nasal spray Place 2 sprays into both nostrils daily. Patient not taking: Reported on 02/14/2017 04/26/16 04/26/17  Tommi RumpsSummers, Rhonda L, PA-C    Allergies Patient has no known allergies.  No family history on file.  Social History Social History   Tobacco Use  . Smoking status: Current Every Day Smoker    Packs/day: 0.50    Types: Cigarettes  . Smokeless tobacco: Never Used  Substance Use Topics  . Alcohol use: Yes  . Drug use: Not on file    Review of Systems  Constitutional: No fever/chills Eyes: No visual changes. ENT: No sore throat. Cardiovascular: Denies chest pain. Respiratory: Denies shortness of breath. Gastrointestinal: No abdominal pain.  No nausea, no vomiting.  No diarrhea.  No constipation. Genitourinary: Negative for dysuria. Musculoskeletal: Negative for back pain. Skin: Negative for rash. Neurological: Negative for headaches, focal weakness   ____________________________________________   PHYSICAL EXAM:  VITAL SIGNS: ED Triage Vitals [04/18/17 2211]  Enc Vitals Group     BP 117/83     Pulse Rate 98     Resp 17     Temp 98.3 F (36.8 C)     Temp Source Oral     SpO2 93 %  Weight 140 lb (63.5 kg)     Height 5\' 3"  (1.6 m)     Head Circumference      Peak Flow      Pain Score 10     Pain Loc      Pain Edu?      Excl. in GC?     Constitutional: Alert and oriented. Well appearing and in no acute distress. Eyes: Conjunctivae are normal. PER. EOMI. Head: Atraumatic. Nose: No congestion/rhinnorhea. Mouth/Throat: Mucous membranes are moist.  Oropharynx non-erythematous. Neck: No stridor Cardiovascular: Normal rate, regular rhythm. Grossly normal heart sounds.  Good peripheral circulation. Respiratory: Normal respiratory effort.  No retractions. Lungs CTAB. Gastrointestinal: Soft and nontender.  No distention. No abdominal bruits. No CVA tenderness. Musculoskeletal: her left hip is tender Neurologic:  Normal speech and language. No gross focal neurologic deficits are appreciated. Skin:  Skin is warm, dry and intact. No rash noted. Psychiatric: Mood and affect are normal. Speech and behavior are normal.  ____________________________________________   LABS (all labs ordered are listed, but only abnormal results are displayed)  Labs Reviewed - No data to display ____________________________________________  EKG   ____________________________________________  RADIOLOGY  ED MD interpretation:left hip fracture as described by radiology below I reviewed the films  Official radiology report(s): Dg Hip Unilat With Pelvis 2-3 Views Left  Result Date: 04/18/2017 CLINICAL DATA:  Patient slipped and fell onto left side and presents with left hip pain. EXAM: DG HIP (WITH OR WITHOUT PELVIS) 2-3V LEFT COMPARISON:  None. FINDINGS: Subcapital left femoral neck impaction fracture with slight valgus angulation of the left proximal femur is noted. No joint dislocation. The bony pelvis appears intact. There is lower lumbar facet arthropathy. No soft tissue mass or mineralization. IMPRESSION: Left subcapital femoral neck fracture with impaction of the superolateral aspect of the femoral neck on femoral head. Electronically Signed   By: Tollie Eth M.D.   On: 04/18/2017 22:38    ____________________________________________   PROCEDURES  Procedure(s) performed:   Procedures  Critical Care performed:   ____________________________________________   INITIAL IMPRESSION / ASSESSMENT AND PLAN / ED COURSE  and we will admit the patient I discussed the patient with Dr. Margaretann Loveless Will talk to the hospitaist too, lab work is pending. We'll have to monitor the patient and make sure she didn't go into withdrawal.         ____________________________________________   FINAL CLINICAL  IMPRESSION(S) / ED DIAGNOSES  Final diagnoses:  Closed left hip fracture, initial encounter (HCC)  Laceration of right hand without foreign body, initial encounter     ED Discharge Orders    None       Note:  This document was prepared using Dragon voice recognition software and may include unintentional dictation errors.    Arnaldo Natal, MD 04/18/17 2251

## 2017-04-19 ENCOUNTER — Inpatient Hospital Stay: Payer: Self-pay | Admitting: Anesthesiology

## 2017-04-19 ENCOUNTER — Encounter
Admission: EM | Disposition: A | Discharge status: Home Health | Payer: Self-pay | Source: Home / Self Care | Attending: Internal Medicine

## 2017-04-19 ENCOUNTER — Inpatient Hospital Stay: Payer: Self-pay

## 2017-04-19 DIAGNOSIS — Z01810 Encounter for preprocedural cardiovascular examination: Secondary | ICD-10-CM

## 2017-04-19 HISTORY — PX: COMPRESSION HIP SCREW: SHX1386

## 2017-04-19 LAB — URINALYSIS, COMPLETE (UACMP) WITH MICROSCOPIC
BACTERIA UA: NONE SEEN
BILIRUBIN URINE: NEGATIVE
Glucose, UA: NEGATIVE mg/dL
Hgb urine dipstick: NEGATIVE
Ketones, ur: NEGATIVE mg/dL
NITRITE: NEGATIVE
PROTEIN: NEGATIVE mg/dL
Specific Gravity, Urine: 1.026 (ref 1.005–1.030)
pH: 5 (ref 5.0–8.0)

## 2017-04-19 LAB — URINE DRUG SCREEN, QUALITATIVE (ARMC ONLY)
Amphetamines, Ur Screen: NOT DETECTED
BARBITURATES, UR SCREEN: NOT DETECTED
Benzodiazepine, Ur Scrn: POSITIVE — AB
CANNABINOID 50 NG, UR ~~LOC~~: POSITIVE — AB
COCAINE METABOLITE, UR ~~LOC~~: POSITIVE — AB
MDMA (ECSTASY) UR SCREEN: NOT DETECTED
Methadone Scn, Ur: NOT DETECTED
Opiate, Ur Screen: POSITIVE — AB
PHENCYCLIDINE (PCP) UR S: NOT DETECTED
Tricyclic, Ur Screen: NOT DETECTED

## 2017-04-19 LAB — PREGNANCY, URINE: PREG TEST UR: NEGATIVE

## 2017-04-19 LAB — SURGICAL PCR SCREEN
MRSA, PCR: NEGATIVE
Staphylococcus aureus: POSITIVE — AB

## 2017-04-19 LAB — ETHANOL: Alcohol, Ethyl (B): <10 mg/dL (ref <10)

## 2017-04-19 SURGERY — INSERTION, INTRAMEDULLARY ROD, FEMUR
Anesthesia: Choice

## 2017-04-19 SURGERY — INSERTION, INTRAMEDULLARY ROD, FEMUR
Anesthesia: Choice | Laterality: Left

## 2017-04-19 SURGERY — COMPRESSION HIP
Anesthesia: General | Laterality: Left | Wound class: Clean

## 2017-04-19 MED ORDER — DOCUSATE SODIUM 100 MG PO CAPS
100.0000 mg | ORAL_CAPSULE | Freq: Two times a day (BID) | ORAL | Status: DC
  Administered 2017-04-19 – 2017-04-20 (×2): 100 mg via ORAL
  Filled 2017-04-19 (×2): qty 1

## 2017-04-19 MED ORDER — SUGAMMADEX SODIUM 200 MG/2ML IV SOLN
INTRAVENOUS | Status: DC | PRN
  Administered 2017-04-19: 150 mg via INTRAVENOUS

## 2017-04-19 MED ORDER — METOCLOPRAMIDE HCL 5 MG/ML IJ SOLN: 5.0000 mg | Freq: Three times a day (TID) | INTRAMUSCULAR | Status: DC | PRN

## 2017-04-19 MED ORDER — MEPERIDINE HCL 50 MG/ML IJ SOLN: 6.2500 mg | INTRAMUSCULAR | Status: DC | PRN

## 2017-04-19 MED ORDER — NEOMYCIN-POLYMYXIN B GU 40-200000 IR SOLN
Status: DC | PRN
  Administered 2017-04-19: 4 mL

## 2017-04-19 MED ORDER — SODIUM CHLORIDE 0.9 % IV SOLN
INTRAVENOUS | Status: DC
  Administered 2017-04-19 – 2017-04-20 (×2): via INTRAVENOUS

## 2017-04-19 MED ORDER — MORPHINE SULFATE (PF) 2 MG/ML IV SOLN: 0.5000 mg | INTRAVENOUS | Status: DC | PRN

## 2017-04-19 MED ORDER — ACETAMINOPHEN 325 MG PO TABS: 325.0000 mg | ORAL_TABLET | Freq: Four times a day (QID) | ORAL | Status: DC | PRN

## 2017-04-19 MED ORDER — LIDOCAINE HCL (PF) 2 % IJ SOLN
INTRAMUSCULAR | Status: AC
  Filled 2017-04-19: qty 10

## 2017-04-19 MED ORDER — PROPOFOL 10 MG/ML IV BOLUS
INTRAVENOUS | Status: AC
  Filled 2017-04-19: qty 20

## 2017-04-19 MED ORDER — SEVOFLURANE IN SOLN
RESPIRATORY_TRACT | Status: AC
  Filled 2017-04-19: qty 250

## 2017-04-19 MED ORDER — FENTANYL CITRATE (PF) 100 MCG/2ML IJ SOLN
INTRAMUSCULAR | Status: AC
  Filled 2017-04-19: qty 2

## 2017-04-19 MED ORDER — LACTATED RINGERS IV SOLN
INTRAVENOUS | Status: DC | PRN
  Administered 2017-04-19: 11:00:00 via INTRAVENOUS

## 2017-04-19 MED ORDER — DEXAMETHASONE SODIUM PHOSPHATE 10 MG/ML IJ SOLN
INTRAMUSCULAR | Status: AC
  Filled 2017-04-19: qty 1

## 2017-04-19 MED ORDER — PANTOPRAZOLE SODIUM 40 MG PO TBEC
40.0000 mg | DELAYED_RELEASE_TABLET | Freq: Every day | ORAL | Status: DC
  Administered 2017-04-19 – 2017-04-20 (×2): 40 mg via ORAL
  Filled 2017-04-19 (×2): qty 1

## 2017-04-19 MED ORDER — PROMETHAZINE HCL 25 MG/ML IJ SOLN: 6.2500 mg | INTRAMUSCULAR | Status: DC | PRN

## 2017-04-19 MED ORDER — ROCURONIUM BROMIDE 50 MG/5ML IV SOLN
INTRAVENOUS | Status: AC
  Filled 2017-04-19: qty 1

## 2017-04-19 MED ORDER — OXYCODONE HCL 5 MG/5ML PO SOLN: 5.0000 mg | Freq: Once | ORAL | Status: DC | PRN

## 2017-04-19 MED ORDER — INFLUENZA VAC SPLIT QUAD 0.5 ML IM SUSY: 0.5000 mL | PREFILLED_SYRINGE | INTRAMUSCULAR | Status: DC

## 2017-04-19 MED ORDER — CEFAZOLIN SODIUM 1 G IJ SOLR
INTRAMUSCULAR | Status: AC
  Filled 2017-04-19: qty 20

## 2017-04-19 MED ORDER — ONDANSETRON HCL 4 MG/2ML IJ SOLN
INTRAMUSCULAR | Status: AC
  Filled 2017-04-19: qty 2

## 2017-04-19 MED ORDER — HYDROMORPHONE HCL 1 MG/ML IJ SOLN
INTRAMUSCULAR | Status: DC | PRN
  Administered 2017-04-19 (×2): 0.5 mg via INTRAVENOUS

## 2017-04-19 MED ORDER — NEOMYCIN-POLYMYXIN B GU 40-200000 IR SOLN
Status: AC
  Filled 2017-04-19: qty 4

## 2017-04-19 MED ORDER — EPHEDRINE SULFATE 50 MG/ML IJ SOLN
INTRAMUSCULAR | Status: AC
  Filled 2017-04-19: qty 1

## 2017-04-19 MED ORDER — HYDROMORPHONE HCL 1 MG/ML IJ SOLN: 0.2500 mg | INTRAMUSCULAR | Status: DC | PRN

## 2017-04-19 MED ORDER — BISACODYL 10 MG RE SUPP: 10.0000 mg | Freq: Every day | RECTAL | Status: DC | PRN

## 2017-04-19 MED ORDER — MUPIROCIN 2 % EX OINT
1.0000 "application " | TOPICAL_OINTMENT | Freq: Two times a day (BID) | CUTANEOUS | Status: DC
  Administered 2017-04-19: 1 via NASAL
  Filled 2017-04-19: qty 22

## 2017-04-19 MED ORDER — ROCURONIUM BROMIDE 100 MG/10ML IV SOLN
INTRAVENOUS | Status: DC | PRN
  Administered 2017-04-19: 50 mg via INTRAVENOUS

## 2017-04-19 MED ORDER — METOCLOPRAMIDE HCL 10 MG PO TABS: 5.0000 mg | ORAL_TABLET | Freq: Three times a day (TID) | ORAL | Status: DC | PRN

## 2017-04-19 MED ORDER — BUPIVACAINE-EPINEPHRINE (PF) 0.5% -1:200000 IJ SOLN
INTRAMUSCULAR | Status: AC
  Filled 2017-04-19: qty 30

## 2017-04-19 MED ORDER — OXYCODONE HCL 5 MG PO TABS: 5.0000 mg | ORAL_TABLET | Freq: Once | ORAL | Status: DC | PRN

## 2017-04-19 MED ORDER — CEFAZOLIN SODIUM-DEXTROSE 2-4 GM/100ML-% IV SOLN
2.0000 g | Freq: Four times a day (QID) | INTRAVENOUS | Status: AC
Start: 2017-04-19 — End: 2017-04-20
  Administered 2017-04-19 – 2017-04-20 (×3): 2 g via INTRAVENOUS
  Filled 2017-04-19 (×3): qty 100

## 2017-04-19 MED ORDER — ONDANSETRON HCL 4 MG/2ML IJ SOLN: 4.0000 mg | Freq: Four times a day (QID) | INTRAMUSCULAR | Status: DC | PRN

## 2017-04-19 MED ORDER — FLEET ENEMA 7-19 GM/118ML RE ENEM: 1.0000 | ENEMA | Freq: Once | RECTAL | Status: DC | PRN

## 2017-04-19 MED ORDER — LIDOCAINE HCL (CARDIAC) 20 MG/ML IV SOLN
INTRAVENOUS | Status: DC | PRN
  Administered 2017-04-19: 100 mg via INTRAVENOUS

## 2017-04-19 MED ORDER — ONDANSETRON HCL 4 MG/2ML IJ SOLN
INTRAMUSCULAR | Status: DC | PRN
  Administered 2017-04-19: 4 mg via INTRAVENOUS

## 2017-04-19 MED ORDER — HYDROCODONE-ACETAMINOPHEN 7.5-325 MG PO TABS: 1.0000 | ORAL_TABLET | ORAL | Status: DC | PRN

## 2017-04-19 MED ORDER — BUPIVACAINE-EPINEPHRINE (PF) 0.5% -1:200000 IJ SOLN
INTRAMUSCULAR | Status: DC | PRN
  Administered 2017-04-19: 20 mL via PERINEURAL

## 2017-04-19 MED ORDER — MAGNESIUM HYDROXIDE 400 MG/5ML PO SUSP
30.0000 mL | Freq: Every day | ORAL | Status: DC | PRN
  Administered 2017-04-20: 30 mL via ORAL
  Filled 2017-04-19: qty 30

## 2017-04-19 MED ORDER — DEXAMETHASONE SODIUM PHOSPHATE 10 MG/ML IJ SOLN
INTRAMUSCULAR | Status: DC | PRN
  Administered 2017-04-19: 10 mg via INTRAVENOUS

## 2017-04-19 MED ORDER — KETOROLAC TROMETHAMINE 15 MG/ML IJ SOLN
15.0000 mg | Freq: Four times a day (QID) | INTRAMUSCULAR | Status: AC
  Administered 2017-04-19 – 2017-04-20 (×4): 15 mg via INTRAVENOUS
  Filled 2017-04-19 (×5): qty 1

## 2017-04-19 MED ORDER — DIPHENHYDRAMINE HCL 12.5 MG/5ML PO ELIX: 12.5000 mg | ORAL_SOLUTION | ORAL | Status: DC | PRN

## 2017-04-19 MED ORDER — FENTANYL CITRATE (PF) 100 MCG/2ML IJ SOLN
25.0000 ug | INTRAMUSCULAR | Status: DC | PRN
  Administered 2017-04-19 (×3): 50 ug via INTRAVENOUS

## 2017-04-19 MED ORDER — HYDROCODONE-ACETAMINOPHEN 5-325 MG PO TABS
1.0000 | ORAL_TABLET | ORAL | Status: DC | PRN
Start: 2017-04-19 — End: 2017-04-20
  Administered 2017-04-20: 1 via ORAL
  Filled 2017-04-19: qty 1

## 2017-04-19 MED ORDER — ENOXAPARIN SODIUM 40 MG/0.4ML ~~LOC~~ SOLN
40.0000 mg | SUBCUTANEOUS | Status: DC
  Administered 2017-04-20: 40 mg via SUBCUTANEOUS
  Filled 2017-04-19: qty 0.4

## 2017-04-19 MED ORDER — HYDROMORPHONE HCL 1 MG/ML IJ SOLN
INTRAMUSCULAR | Status: AC
Start: 2017-04-19 — End: 2017-04-19
  Filled 2017-04-19: qty 1

## 2017-04-19 MED ORDER — CEFAZOLIN SODIUM-DEXTROSE 2-3 GM-%(50ML) IV SOLR
INTRAVENOUS | Status: DC | PRN
  Administered 2017-04-19: 2 g via INTRAVENOUS

## 2017-04-19 MED ORDER — PHENYLEPHRINE HCL 10 MG/ML IJ SOLN
INTRAMUSCULAR | Status: DC | PRN
  Administered 2017-04-19 (×3): 200 ug via INTRAVENOUS

## 2017-04-19 MED ORDER — FENTANYL CITRATE (PF) 100 MCG/2ML IJ SOLN
INTRAMUSCULAR | Status: DC | PRN
  Administered 2017-04-19: 100 ug via INTRAVENOUS

## 2017-04-19 MED ORDER — NICOTINE 21 MG/24HR TD PT24
21.0000 mg | MEDICATED_PATCH | Freq: Every day | TRANSDERMAL | Status: DC
  Administered 2017-04-19 – 2017-04-20 (×2): 21 mg via TRANSDERMAL
  Filled 2017-04-19 (×2): qty 1

## 2017-04-19 MED ORDER — ONDANSETRON HCL 4 MG PO TABS: 4.0000 mg | ORAL_TABLET | Freq: Four times a day (QID) | ORAL | Status: DC | PRN

## 2017-04-19 MED ORDER — PROPOFOL 10 MG/ML IV BOLUS
INTRAVENOUS | Status: DC | PRN
  Administered 2017-04-19: 140 mg via INTRAVENOUS

## 2017-04-19 SURGICAL SUPPLY — 30 items
BIT DRILL CANN LRG QC 5X300 (BIT) ×3 IMPLANT
BNDG COHESIVE 4X5 TAN STRL (GAUZE/BANDAGES/DRESSINGS) ×3 IMPLANT
BNDG COHESIVE 6X5 TAN STRL LF (GAUZE/BANDAGES/DRESSINGS) ×3 IMPLANT
CANISTER SUCT 1200ML W/VALVE (MISCELLANEOUS) ×3 IMPLANT
CHLORAPREP W/TINT 26ML (MISCELLANEOUS) ×6 IMPLANT
DRSG OPSITE POSTOP 3X4 (GAUZE/BANDAGES/DRESSINGS) ×3 IMPLANT
ELECT REM PT RETURN 9FT ADLT (ELECTROSURGICAL) ×3
ELECTRODE REM PT RTRN 9FT ADLT (ELECTROSURGICAL) ×1 IMPLANT
GLOVE BIO SURGEON STRL SZ8 (GLOVE) ×6 IMPLANT
GLOVE INDICATOR 8.0 STRL GRN (GLOVE) ×3 IMPLANT
GOWN STRL REUS W/ TWL LRG LVL3 (GOWN DISPOSABLE) ×1 IMPLANT
GOWN STRL REUS W/ TWL XL LVL3 (GOWN DISPOSABLE) ×1 IMPLANT
GOWN STRL REUS W/TWL LRG LVL3 (GOWN DISPOSABLE) ×2
GOWN STRL REUS W/TWL XL LVL3 (GOWN DISPOSABLE) ×2
GUIDEWIRE THREADED 2.8 (WIRE) ×9 IMPLANT
NEEDLE FILTER BLUNT 18X 1/2SAF (NEEDLE) ×2
NEEDLE FILTER BLUNT 18X1 1/2 (NEEDLE) ×1 IMPLANT
NEEDLE HYPO 22GX1.5 SAFETY (NEEDLE) ×3 IMPLANT
PACK HIP COMPR (MISCELLANEOUS) ×3 IMPLANT
SCREW CANN 6.5X75MM (Screw) ×6 IMPLANT
SCREW CANN 6.5X80MM (Screw) ×3 IMPLANT
STAPLER SKIN PROX 35W (STAPLE) ×3 IMPLANT
STRAP SAFETY 5IN WIDE (MISCELLANEOUS) ×3 IMPLANT
SUT PROLENE 2 0 FS (SUTURE) IMPLANT
SUT VIC AB 0 CT1 36 (SUTURE) ×3 IMPLANT
SUT VIC AB 0 SH 27 (SUTURE) IMPLANT
SUT VIC AB 2-0 CT1 27 (SUTURE) ×2
SUT VIC AB 2-0 CT1 TAPERPNT 27 (SUTURE) ×1 IMPLANT
SYR 20CC LL (SYRINGE) ×3 IMPLANT
SYR 5ML LL (SYRINGE) ×3 IMPLANT

## 2017-04-19 SURGICAL SUPPLY — 32 items
BNDG COHESIVE 4X5 TAN STRL (GAUZE/BANDAGES/DRESSINGS) ×3 IMPLANT
BNDG COHESIVE 6X5 TAN STRL LF (GAUZE/BANDAGES/DRESSINGS) ×3 IMPLANT
CANISTER SUCT 1200ML W/VALVE (MISCELLANEOUS) ×3 IMPLANT
CHLORAPREP W/TINT 26ML (MISCELLANEOUS) ×6 IMPLANT
DRAPE C-ARMOR (DRAPES) ×3 IMPLANT
DRAPE SHEET LG 3/4 BI-LAMINATE (DRAPES) ×3 IMPLANT
DRSG OPSITE POSTOP 3X4 (GAUZE/BANDAGES/DRESSINGS) ×9 IMPLANT
DRSG OPSITE POSTOP 4X6 (GAUZE/BANDAGES/DRESSINGS) ×3 IMPLANT
ELECT CAUTERY BLADE 6.4 (BLADE) ×3 IMPLANT
ELECT REM PT RETURN 9FT ADLT (ELECTROSURGICAL) ×3
ELECTRODE REM PT RTRN 9FT ADLT (ELECTROSURGICAL) ×1 IMPLANT
GAUZE SPONGE 4X4 12PLY STRL (GAUZE/BANDAGES/DRESSINGS) ×3 IMPLANT
GLOVE BIO SURGEON STRL SZ8 (GLOVE) ×6 IMPLANT
GLOVE INDICATOR 8.0 STRL GRN (GLOVE) ×3 IMPLANT
GOWN STRL REUS W/ TWL LRG LVL3 (GOWN DISPOSABLE) ×1 IMPLANT
GOWN STRL REUS W/ TWL XL LVL3 (GOWN DISPOSABLE) ×1 IMPLANT
GOWN STRL REUS W/TWL LRG LVL3 (GOWN DISPOSABLE) ×2
GOWN STRL REUS W/TWL XL LVL3 (GOWN DISPOSABLE) ×2
MAT BLUE FLOOR 46X72 FLO (MISCELLANEOUS) ×3 IMPLANT
NEEDLE FILTER BLUNT 18X 1/2SAF (NEEDLE) ×2
NEEDLE FILTER BLUNT 18X1 1/2 (NEEDLE) ×1 IMPLANT
NEEDLE HYPO 22GX1.5 SAFETY (NEEDLE) ×3 IMPLANT
NS IRRIG 500ML POUR BTL (IV SOLUTION) ×3 IMPLANT
PACK HIP COMPR (MISCELLANEOUS) ×3 IMPLANT
STAPLER SKIN PROX 35W (STAPLE) ×3 IMPLANT
STRAP SAFETY 5IN WIDE (MISCELLANEOUS) ×3 IMPLANT
SUT VIC AB 0 CT1 36 (SUTURE) ×3 IMPLANT
SUT VIC AB 1 CT1 36 (SUTURE) ×3 IMPLANT
SUT VIC AB 2-0 CT1 (SUTURE) ×6 IMPLANT
SYR 10ML LL (SYRINGE) ×3 IMPLANT
SYR 30ML LL (SYRINGE) ×3 IMPLANT
TAPE MICROFOAM 4IN (TAPE) ×3 IMPLANT

## 2017-04-19 NOTE — Progress Notes (Signed)
15 minute call to floor. 

## 2017-04-19 NOTE — Anesthesia Post-op Follow-up Note (Signed)
Anesthesia QCDR form completed.        

## 2017-04-19 NOTE — Consult Note (Signed)
ORTHOPAEDIC CONSULTATION  REQUESTING PHYSICIAN: Adrian Saran, MD  Chief Complaint:   Left hip pain.   History of Present Illness: Nicole Macdonald is a 47 y.o. 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  The patient was in her usual state of health when she slipped and fell on a wet/greasy floor while at work last night, landing on her left hip.  She was brought to the emergency room where x-rays demonstrated a valgus impacted left femoral neck fracture.  The patient has been admitted to the hospitalist service in anticipation of definitive management of her injury.  The patient denies any associated injuries.  She did not strike her head or lose consciousness.  She also denies any lightheadedness, dizziness, chest pain, shortness of breath, or other symptoms which may have precipitated her fall.  Past Medical History:  Diagnosis Date  . Bipolar 1 disorder (HCC)   . Manic depression (HCC)    Past Surgical History:  Procedure Laterality Date  . denies     Social History   Socioeconomic History  . Marital status: Single    Spouse name: None  . Number of children: None  . Years of education: None  . Highest education level: None  Social Needs  . Financial resource strain: None  . Food insecurity - worry: None  . Food insecurity - inability: None  . Transportation needs - medical: None  . Transportation needs - non-medical: None  Occupational History  . None  Tobacco Use  . Smoking status: Current Every Day Smoker    Packs/day: 0.50    Types: Cigarettes  . Smokeless tobacco: Never Used  Substance and Sexual Activity  . Alcohol use: Yes  . Drug use: None  . Sexual activity: None  Other Topics Concern  . None  Social History Narrative  . None   History reviewed. No pertinent family history. No Known Allergies Prior to Admission medications   Medication  Sig Start Date End Date Taking? Authorizing Provider  albuterol (PROVENTIL HFA;VENTOLIN HFA) 108 (90 Base) MCG/ACT inhaler Inhale 2 puffs into the lungs every 6 (six) hours as needed for wheezing or shortness of breath. 02/14/17  Yes Veronese, [place], MD  amphetamine-dextroamphetamine (ADDERALL) 20 MG tablet Take 20 mg by mouth daily.   Yes [provider]  clonazePAM (KLONOPIN) 2 MG tablet Take 2 mg by mouth 4 (four) times daily.    Yes [provider]  FLUoxetine (PROZAC) 20 MG capsule Take 20 mg by mouth daily.   Yes [provider]  ibuprofen (ADVIL,MOTRIN) 600 MG tablet Take 600 mg by mouth. 04/07/17  Yes [provider]  Lidocaine 4 % PTCH Place onto the skin. 04/07/17  Yes [provider]  azithromycin (ZITHROMAX Z-PAK) 250 MG tablet Take 2 tablets (500 mg) on  Day 1,  followed by 1 tablet (250 mg) once daily on Days 2 through 5. Patient not taking: Reported on 02/14/2017 04/26/16   Tommi Rumps, PA-C  benzonatate (TESSALON PERLES) 100 MG capsule Take 1 capsule (100 mg total) by mouth 3 (three) times daily as needed for cough. Patient not taking: Reported on 02/14/2017 04/26/16 04/26/17  Tommi Rumps, PA-C  fluticasone [facility]) 50 MCG/ACT nasal spray Place 2 sprays into both nostrils daily. Patient not taking: Reported on 02/14/2017 04/26/16 04/26/17  Tommi Rumps, PA-C   Dg Hip Unilat With Pelvis 2-3 Views Left  Result Date: 04/18/2017 CLINICAL DATA:  Patient slipped  and fell onto left side and presents with left hip pain. EXAM: DG HIP (WITH OR WITHOUT PELVIS) 2-3V LEFT COMPARISON:  None. FINDINGS: Subcapital left femoral neck impaction fracture with slight valgus angulation of the left proximal femur is noted. No joint dislocation. The bony pelvis appears intact. There is lower lumbar facet arthropathy. No soft tissue mass or mineralization. IMPRESSION: Left subcapital femoral neck fracture with impaction of the superolateral aspect of the  femoral neck on femoral head. Electronically Signed   By: Tollie Ethavid  Kwon M.D.   On: 04/18/2017 22:38    Positive ROS: All other systems have been reviewed and were otherwise negative with the exception of those mentioned in the HPI and as above.  Physical Exam: General:  Alert, no acute distress Psychiatric:  Patient is competent for consent with normal mood and affect   Cardiovascular:  No pedal edema Respiratory:  No wheezing, non-labored breathing GI:  Abdomen is soft and non-tender Skin:  No lesions in the area of chief complaint Neurologic:  Sensation intact distally Lymphatic:  No axillary or cervical lymphadenopathy  Orthopedic Exam:  Orthopedic examination is limited to left hip and lower extremity.  The left lower extremity is held in a slightly externally rotated position as compared to the right.  Skin inspection around the left hip is unremarkable.  There is no swelling, erythema, ecchymosis, or abrasions.  She has mild-moderate tenderness to palpation over the anterior and anterolateral aspect of the hip.  She has more significant pain with any attempted active or passive motion of the hip.  She is neurovascularly intact to the left lower extremity and foot.  X-rays:  X-rays of the pelvis and left hip are available for review.  The findings are as described above.  No significant degenerative changes are identified.  Assessment: Valgus impacted left femoral neck fracture.  Plan: Treatment options have been discussed with the patient and her family, who are at the bedside, including both surgical and nonsurgical options.  The patient would like to proceed with surgical intervention to include in situ cannulated screw fixation of the valgus impacted left femoral neck fracture.  This procedure has been discussed in detail, as have the potential risks (including bleeding, infection, nerve and/or blood vessel injury, persistent or recurrent pain, stiffness, malunion and/or nonunion,  avascular necrosis, degenerative joint disease, need for further surgery, blood clots, strokes, heart attacks and/or arrhythmias, etc.) and benefits.  The patient states her understanding and wishes to proceed.  A consent has been signed.  Thank you for asked me to participate in the care of this most pleasant woman.  I will be happy to follow her with you.   Maryagnes AmosJ. Jeffrey Loreal Schuessler, MD  Beeper #:  [phone]  04/19/2017 10:36 AM

## 2017-04-19 NOTE — Transfer of Care (Signed)
Immediate Anesthesia Transfer of Care Note  Patient: Nicole Macdonald  Procedure(s) Performed: Procedure(s): left hip cannulated screw pinning (Left)  Patient Location: PACU  Anesthesia Type:General  Level of Consciousness: sedated  Airway & Oxygen Therapy: Patient Spontanous Breathing and Patient connected to face mask oxygen  Post-op Assessment: Report given to RN and Post -op Vital signs reviewed and stable  Post vital signs: Reviewed and stable  Last Vitals:  Vitals:   04/19/17 0803 04/19/17 1200  BP: (!) 94/54 123/76  Pulse: 63 94  Resp: 16 17  Temp: 36.9 C   SpO2: 97% 99%    Complications: No apparent anesthesia complications

## 2017-04-19 NOTE — Anesthesia Preprocedure Evaluation (Signed)
Anesthesia Evaluation  Patient identified by MRN, date of birth, ID band Patient awake    Reviewed: Allergy & Precautions, NPO status , Patient's Chart, lab work & pertinent test results  History of Anesthesia Complications Negative for: history of anesthetic complications  Airway Mallampati: III  TM Distance: >3 FB Neck ROM: Full    Dental no notable dental hx.    Pulmonary neg sleep apnea, neg COPD, Current Smoker,    breath sounds clear to auscultation- rhonchi (-) wheezing      Cardiovascular Exercise Tolerance: Good (-) hypertension(-) CAD, (-) Past MI, (-) Cardiac Stents and (-) CABG  Rhythm:Regular Rate:Normal - Systolic murmurs and - Diastolic murmurs    Neuro/Psych PSYCHIATRIC DISORDERS Depression Bipolar Disorder negative neurological ROS     GI/Hepatic negative GI ROS, Neg liver ROS,   Endo/Other  negative endocrine ROSneg diabetes  Renal/GU negative Renal ROS     Musculoskeletal negative musculoskeletal ROS (+)   Abdominal (+) - obese,   Peds  Hematology negative hematology ROS (+)   Anesthesia Other Findings Past Medical History: No date: Bipolar 1 disorder (HCC) No date: Manic depression (HCC)   Reproductive/Obstetrics                             Anesthesia Physical Anesthesia Plan  ASA: II  Anesthesia Plan: General   Post-op Pain Management:    Induction: Intravenous  PONV Risk Score and Plan: 1 and Ondansetron, Midazolam and Dexamethasone  Airway Management Planned: Oral ETT  Additional Equipment:   Intra-op Plan:   Post-operative Plan: Extubation in OR  Informed Consent: I have reviewed the patients History and Physical, chart, labs and discussed the procedure including the risks, benefits and alternatives for the proposed anesthesia with the patient or authorized representative who has indicated his/her understanding and acceptance.   Dental advisory  given  Plan Discussed with: CRNA and Anesthesiologist  Anesthesia Plan Comments:         Anesthesia Quick Evaluation

## 2017-04-19 NOTE — Anesthesia Procedure Notes (Signed)
Procedure Name: Intubation Date/Time: 04/19/2017 10:55 AM Performed by: Doreen Salvage, CRNA Pre-anesthesia Checklist: Patient identified, Patient being monitored, Timeout performed, Emergency Drugs available and Suction available Patient Re-evaluated:Patient Re-evaluated prior to induction Oxygen Delivery Method: Circle system utilized Preoxygenation: Pre-oxygenation with 100% oxygen Induction Type: IV induction Ventilation: Mask ventilation without difficulty Laryngoscope Size: Mac and 3 Grade View: Grade I Tube type: Oral Tube size: 7.0 mm Number of attempts: 1 Airway Equipment and Method: Stylet Placement Confirmation: ETT inserted through vocal cords under direct vision,  positive ETCO2 and breath sounds checked- equal and bilateral Secured at: 22 cm Tube secured with: Tape Dental Injury: Teeth and Oropharynx as per pre-operative assessment

## 2017-04-19 NOTE — Anesthesia Postprocedure Evaluation (Signed)
Anesthesia Post Note  Patient: Stann MainlandMichelle L Rodenberg  Procedure(s) Performed: left hip cannulated screw pinning (Left )  Patient location during evaluation: PACU Anesthesia Type: General Level of consciousness: awake and alert and oriented Pain management: pain level controlled Vital Signs Assessment: post-procedure vital signs reviewed and stable Respiratory status: spontaneous breathing, nonlabored ventilation and respiratory function stable Cardiovascular status: blood pressure returned to baseline and stable Postop Assessment: no signs of nausea or vomiting Anesthetic complications: no     Last Vitals:  Vitals:   04/19/17 1236 04/19/17 1258  BP:  102/61  Pulse: 87 80  Resp: (!) 21   Temp:  36.6 C  SpO2: 98% 92%    Last Pain:  Vitals:   04/19/17 1258  TempSrc: Oral  PainSc:                  Mintie Witherington

## 2017-04-19 NOTE — Progress Notes (Signed)
[facility]. Tolerated well.

## 2017-04-19 NOTE — Op Note (Signed)
04/19/2017  12:01 PM  Patient:   Nicole Macdonald  Pre-Op Diagnosis:   Valgus-impacted left femoral neck fracture.  Post-Op Diagnosis:   Same.  Procedure:   In situ cannulated screw fixation of valgus-impacted left femoral neck fracture.  Surgeon:   Maryagnes AmosJ. Jeffrey Poggi, MD  Assistant:   None  Anesthesia:   GET  Findings:   As above.  Complications:   None  EBL:   25 cc  Fluids:   500 cc crystalloid  UOP:   None  TT:   None  Drains:   None  Closure:   Staples  Implants:   Biomet 6.5 mm cannulated screws (16 mm) 3  Brief Clinical Note:   The patient is a 47 year old female who sustained the above-noted injury yesterday evening when she slipped on a greasy floor at work, landing on her left hip. The patient presented to the emergency room where x-rays demonstrated the above-noted findings. The patient has been cleared medically and presents at this time for definitive management of this injury.  Procedure:   The patient was brought into the operating room. After adequate general endotracheal intubation and anesthesia was obtained, the patient was lain in the supine position on the fracture table. The uninjured leg was placed in a flexed and abducted position over the well-leg holder while the operative leg was placed in gentle longitudinal traction with some internal rotation. The adequacy of fracture position was verified fluoroscopically in AP and lateral projections before the lateral aspect of the left hip and thigh were prepped with ChloraPrep solution and draped sterilely. Preoperative antibiotics were administered. An approximately 3-4 cm incision was made over the lateral aspect of the lower part of the greater trochanter as verified fluoroscopically. This incision was carried down through the subcutaneous cutaneous tissues to expose the iliotibial band. This was split the length the incision to expose the lateral aspect of the proximal femur at the level of the inferior  most part of the greater trochanter. Under fluoroscopic guidance, a guide wire was drilled up through the femoral neck along the calcar into the femoral head to rest within 5 mm of subchondral bone. Its position was assessed fluoroscopically in AP and lateral projections and found to be excellent. Two additional guide wires were placed in parallel fashion more proximally, anterior and posterior to the original pin in an inverted triangular configuration. Again the position of these pins was verified fluoroscopically in AP, lateral, and oblique projections and found to be excellent. The length of each of these pins was measured before each pin was overdrilled with the appropriate cannulated drill. Each of these screws was inserted and advanced to within 8-10 mm of subchondral bone. Again the position of each of these screws was assessed fluoroscopically in AP, lateral, and oblique projections, and found to be excellent.  The wound was copiously irrigated with sterile saline solution before the IT band was reapproximated using #0 Vicryl interrupted sutures. The subcutaneous tissues also were closed using 2-0 Vicryl interrupted sutures before the skin was closed using staples. A total of 20 cc of 0.5% Sensorcaine with epinephrine was injected in and around the surgical incision before a sterile occlusive dressing was applied to the wound. The patient was then awakened, transferred back to his hospital bed, and returned to the recovery room in satisfactory condition after tolerating the procedure well.

## 2017-04-19 NOTE — Clinical Social Work Note (Signed)
CSW received consult for placement. CSW will follow pending PT recommendation.  Argentina PonderKaren Martha Iseah Plouff, MSW, Theresia MajorsLCSWA [phone]

## 2017-04-19 NOTE — Progress Notes (Signed)
[facility] [place] [facility]   PATIENT NAME: Nicole Macdonald    MR#:  [id number]  DATE OF BIRTH:  October 19, 1970  SUBJECTIVE:   Patient had a mechanical fall at work.  REVIEW OF SYSTEMS:    Review of Systems  Constitutional: Negative for fever, chills weight loss HENT: Negative for ear pain, nosebleeds, congestion, facial swelling, rhinorrhea, neck pain, neck stiffness and ear discharge.   Respiratory: Negative for cough, shortness of breath, wheezing  Cardiovascular: Negative for chest pain, palpitations and leg swelling.  Gastrointestinal: Negative for heartburn, abdominal pain, vomiting, diarrhea or consitpation Genitourinary: Negative for dysuria, urgency, frequency, hematuria Musculoskeletal: Negative for back pain or ++hip pain Neurological: Negative for dizziness, seizures, syncope, focal weakness,  numbness and headaches.  Hematological: Does not bruise/bleed easily.  Psychiatric/Behavioral: Negative for hallucinations, confusion, dysphoric mood    Tolerating Diet: npo      DRUG ALLERGIES:  No Known Allergies  VITALS:  Blood pressure (!) 89/50, pulse 60, temperature 97.9 F (36.6 C), temperature source Oral, resp. rate 15, height 5\' 3"  (1.6 m), weight 68 kg (150 lb), SpO2 97 %.  PHYSICAL EXAMINATION:  Constitutional: Appears well-developed and well-nourished. No distress. HENT: Normocephalic. Marland Kitchen. Oropharynx is clear and moist.  Eyes: Conjunctivae and EOM are normal. PERRLA, no scleral icterus.  Neck: Normal ROM. Neck supple. No JVD. No tracheal deviation. CVS: RRR, S1/S2 +, no murmurs, no gallops, no carotid bruit.  Pulmonary: Effort and breath sounds normal, no stridor, rhonchi, wheezes, rales.  Abdominal: Soft. BS +,  no distension, tenderness, rebound or guarding.  Musculoskeletal: Left leg shorter Neuro: Alert. CN 2-12 grossly intact. No focal deficits. Skin: Skin is warm and dry. No rash noted. Psychiatric: Normal mood and affect.       LABORATORY PANEL:   CBC Recent Labs  Lab 04/18/17 2253  WBC 5.3  HGB 11.9*  HCT 34.9*  PLT 198   ------------------------------------------------------------------------------------------------------------------  Chemistries  Recent Labs  Lab 04/18/17 2253  NA 140  K 3.5  CL 107  CO2 26  GLUCOSE 84  BUN 16  CREATININE 0.78  CALCIUM 8.9  AST 24  ALT 16  ALKPHOS 52  BILITOT 0.4   ------------------------------------------------------------------------------------------------------------------  Cardiac Enzymes No results for input(s): TROPONINI in the last 168 hours. ------------------------------------------------------------------------------------------------------------------  RADIOLOGY:  Dg Hip Unilat With Pelvis 2-3 Views Left  Result Date: 04/18/2017 CLINICAL DATA:  Patient slipped and fell onto left side and presents with left hip pain. EXAM: DG HIP (WITH OR WITHOUT PELVIS) 2-3V LEFT COMPARISON:  None. FINDINGS: Subcapital left femoral neck impaction fracture with slight valgus angulation of the left proximal femur is noted. No joint dislocation. The bony pelvis appears intact. There is lower lumbar facet arthropathy. No soft tissue mass or mineralization. IMPRESSION: Left subcapital femoral neck fracture with impaction of the superolateral aspect of the femoral neck on femoral head. Electronically Signed   By: Tollie Ethavid  Kwon M.D.   On: 04/18/2017 22:38     ASSESSMENT AND PLAN:   47 year old female with history of tobacco abuse and bipolar who had a mechanical fall at work.  1.  Left subcapital femoral neck fracture with impaction of the superolateral aspect of the femoral neck on femoral head: Plan for surgery today. Patient low risk for moderate risk procedure may proceed without further cardiac intervention DVT prophylaxis per orthopedic surgery   2.  Bipolar: Continue outpatient medications including Adderall, Klonopin, Prozac  3.  Polysubstance  abuse: Patient encouraged to remain abstinent from illicit drugs  4.Tobacco dependence: Patient is encouraged to quit smoking. Counseling was provided for 4 minutes. Nicotine patch has been ordered      Management plans discussed with the patient and she is in agreement.  CODE STATUS: full  TOTAL TIME TAKING CARE OF THIS PATIENT: 30 minutes.     POSSIBLE D/C monday, DEPENDING ON CLINICAL CONDITION.   Lawrnce Reyez M.D on 04/19/2017 at 7:18 AM  Between 7am to 6pm - Pager - [phone] After 6pm go to [email or web address] - password [email or web address]  [facility]  Office  [phone]  CC: Primary care physician; Patient, No Pcp Per  Note: This dictation was prepared with Dragon dictation along with smaller phrase technology. Any transcriptional errors that result from this process are unintentional.

## 2017-04-19 NOTE — Progress Notes (Signed)
Pt alert and oriented in room. Family was at bedside but has left. IV infusing NS. Pt on room air. Pt has been sleeping a majority of time back from PACU. No complaints from pt. Honeycomb dressing intact.Teds and foot pumps on. Phone and call bell within reach.

## 2017-04-20 ENCOUNTER — Encounter: Payer: Self-pay | Admitting: Surgery

## 2017-04-20 DIAGNOSIS — R45851 Suicidal ideations: Secondary | ICD-10-CM

## 2017-04-20 LAB — BASIC METABOLIC PANEL
Anion gap: 6 (ref 5–15)
BUN: 14 mg/dL (ref 6–20)
CHLORIDE: 110 mmol/L (ref 101–111)
CO2: 24 mmol/L (ref 22–32)
Calcium: 8.6 mg/dL — ABNORMAL LOW (ref 8.9–10.3)
Creatinine, Ser: 0.73 mg/dL (ref 0.44–1.00)
GFR calc Af Amer: >60 mL/min (ref >60)
GLUCOSE: 135 mg/dL — AB (ref 65–99)
Potassium: 3.9 mmol/L (ref 3.5–5.1)
Sodium: 140 mmol/L (ref 135–145)

## 2017-04-20 LAB — CBC
HCT: 32.2 % — ABNORMAL LOW (ref 35.0–47.0)
Hemoglobin: 10.7 g/dL — ABNORMAL LOW (ref 12.0–16.0)
MCH: 31.3 pg (ref 26.0–34.0)
MCHC: 33.3 g/dL (ref 32.0–36.0)
MCV: 93.9 fL (ref 80.0–100.0)
PLATELETS: 166 10*3/uL (ref 150–440)
RBC: 3.43 MIL/uL — ABNORMAL LOW (ref 3.80–5.20)
RDW: 13.6 % (ref 11.5–14.5)
WBC: 9.1 10*3/uL (ref 3.6–11.0)

## 2017-04-20 MED ORDER — CALCIUM CARBONATE-VITAMIN D 500-200 MG-UNIT PO TABS: 1.0000 | ORAL_TABLET | Freq: Two times a day (BID) | ORAL | 3 refills | Status: DC

## 2017-04-20 MED ORDER — NICOTINE 21 MG/24HR TD PT24: 21.0000 mg | MEDICATED_PATCH | Freq: Every day | TRANSDERMAL | 0 refills | Status: DC

## 2017-04-20 MED ORDER — DOCUSATE SODIUM 100 MG PO CAPS: 100.0000 mg | ORAL_CAPSULE | Freq: Two times a day (BID) | ORAL | 0 refills | Status: DC

## 2017-04-20 MED ORDER — HYDROCODONE-ACETAMINOPHEN 5-325 MG PO TABS: 1.0000 | ORAL_TABLET | ORAL | 0 refills | Status: DC | PRN

## 2017-04-20 MED ORDER — ASPIRIN EC 325 MG PO TBEC: 325.0000 mg | DELAYED_RELEASE_TABLET | Freq: Every day | ORAL | 4 refills | Status: AC

## 2017-04-20 MED ORDER — ENOXAPARIN SODIUM 40 MG/0.4ML ~~LOC~~ SOLN: 40.0000 mg | SUBCUTANEOUS | 0 refills | Status: DC

## 2017-04-20 NOTE — Care Management (Addendum)
Provided patient application for [facility], walker and information on [facility]

## 2017-04-20 NOTE — Progress Notes (Signed)
  Subjective: 1 Day Post-Op Procedure(s) (LRB): left hip cannulated screw pinning (Left) Patient reports pain as moderate.   Patient seen in rounds with Dr. Joice LoftsPoggi. Patient is well, and has had no acute complaints or problems Plan is to go Home after hospital stay. Negative for chest pain and shortness of breath Fever: no Gastrointestinal: Negative for nausea and vomiting  Objective: Vital signs in last 24 hours: Temp:  [97.8 F (36.6 C)-98.7 F (37.1 C)] 98.1 F (36.7 C) (03/17 0450) Pulse Rate:  [62-94] 62 (03/17 0450) Resp:  [10-21] 18 (03/16 1628) BP: (93-123)/(51-77) 115/52 (03/17 0450) SpO2:  [92 %-100 %] 99 % (03/17 0450)  Intake/Output from previous day:  Intake/Output Summary (Last 24 hours) at 04/20/2017 0704 Last data filed at 04/20/2017 0616 Gross per 24 hour  Intake 1990 ml  Output 25 ml  Net 1965 ml    Intake/Output this shift: No intake/output data recorded.  Labs: Recent Labs    04/18/17 2253 04/20/17 0344  HGB 11.9* 10.7*   Recent Labs    04/18/17 2253 04/20/17 0344  WBC 5.3 9.1  RBC 3.77* 3.43*  HCT 34.9* 32.2*  PLT 198 166   Recent Labs    04/18/17 2253 04/20/17 0344  NA 140 140  K 3.5 3.9  CL 107 110  CO2 26 24  BUN 16 14  CREATININE 0.78 0.73  GLUCOSE 84 135*  CALCIUM 8.9 8.6*   Recent Labs    04/18/17 2253  INR 0.82     EXAM General - Patient is Alert and Oriented Extremity - Neurovascular intact Dorsiflexion/Plantar flexion intact Compartment soft Dressing/Incision - clean, dry, no drainage Motor Function - intact, moving foot and toes well on exam.  Ambulated to bedside commode with toe-touch weight-bear.  Past Medical History:  Diagnosis Date  . Bipolar 1 disorder (HCC)   . Manic depression (HCC)     Assessment/Plan: 1 Day Post-Op Procedure(s) (LRB): left hip cannulated screw pinning (Left) Active Problems:   Hip fx (HCC)  Estimated body mass index is 26.57 kg/m as calculated from the following:   Height  as of this encounter: 5\' 3"  (1.6 m).   Weight as of this encounter: 68 kg (150 lb). Advance diet Up with therapy D/C IV fluids  DVT Prophylaxis - Lovenox, Foot Pumps and TED hose Partial weight-Bearing to left leg  Dedra Skeensodd Reynalda Canny, PA-C Orthopaedic Surgery 04/20/2017, 7:04 AM

## 2017-04-20 NOTE — Care Management (Signed)
Informed by attending that patient is to discharge today after evaluated by physical therapy.  Patient does not have insurance.  She sustained the hip fracture at work when she slipped in grease at her place of employment- [employer]. She had surgical repair of the hip fracture 04/20/2014.  She provided the name of her manager- Warehouse managerjason manning who can be reached after 2pm. She claims that her expenses should be covered by workmen's comp. There is no workmen comp coverage information available.  CM ws asked to see if patient would qualify for charity care.  If this patient is under workmen's comp, she would not qualify for charity care with [facility].  There is discussion that she is also to discharge home on Lovenox injections.    Alternatives to lovenox could be xarelto/elquis as patient could receive 30 day coupon if physicians feel this would be reasonable alternative.  [facility] would be able to provide a walker for discharge but it is very possible that patient will have to discharge without any services.  Patient will discharge to the home of her sister at [address] [place]. She will only have to navigate one step to get into the house.

## 2017-04-20 NOTE — Care Management (Signed)
Spoke with patient's manager- Annia FriendlyJason Macdonald.  WC carrier is [place] [employer] [phone].  He will notify agency of claim today

## 2017-04-20 NOTE — Progress Notes (Signed)
DISCHARGE NOTE:  Pt given discharge instructions and prescriptions. Pt verbalized understanding. Pts walker delivered to room and sent with pt. Pt wheeled to car by staff.

## 2017-04-20 NOTE — Discharge Instructions (Signed)
INSTRUCTIONS AFTER Surgery  o Remove items at home which could result in a fall. This includes throw rugs or furniture in walking pathways o ICE to the affected joint every three hours while awake for 30 minutes at a time, for at least the first 3-5 days, and then as needed for pain and swelling.  Continue to use ice for pain and swelling. You may notice swelling that will progress down to the foot and ankle.  This is normal after surgery.  Elevate your leg when you are not up walking on it.   o Continue to use the breathing machine you got in the hospital (incentive spirometer) which will help keep your temperature down.  It is common for your temperature to cycle up and down following surgery, especially at night when you are not up moving around and exerting yourself.  The breathing machine keeps your lungs expanded and your temperature down.   DIET:  As you were doing prior to hospitalization, we recommend a well-balanced diet.  DRESSING / WOUND CARE / SHOWERING  Surgical bandages waterproof.  Able to shower.  Pat the wound dry.  Bandage can remain intact until staples are removed.  ACTIVITY  o Increase activity slowly as tolerated, but follow the weight bearing instructions below.   o No driving for 6 weeks or until further direction given by your physician.  You cannot drive while taking narcotics.  o No lifting or carrying greater than 10 lbs. until further directed by your surgeon. o Avoid periods of inactivity such as sitting longer than an hour when not asleep. This helps prevent blood clots.  o You may return to work once you are authorized by your doctor.     WEIGHT BEARING  Partial weight-bear on the left leg with a walker.   EXERCISES Gentle ambulation and range of motion exercises.  Physical therapy will assist in this.  CONSTIPATION  Constipation is defined medically as fewer than three stools per week and severe constipation as less than one stool per week.  Even if  you have a regular bowel pattern at home, your normal regimen is likely to be disrupted due to multiple reasons following surgery.  Combination of anesthesia, postoperative narcotics, change in appetite and fluid intake all can affect your bowels.   YOU MUST use at least one of the following options; they are listed in order of increasing strength to get the job done.  They are all available over the counter, and you may need to use some, POSSIBLY even all of these options:    Drink plenty of fluids (prune juice may be helpful) and high fiber foods Colace 100 mg by mouth twice a day  Senokot for constipation as directed and as needed Dulcolax (bisacodyl), take with full glass of water  Miralax (polyethylene glycol) once or twice a day as needed.  If you have tried all these things and are unable to have a bowel movement in the first 3-4 days after surgery call either your surgeon or your primary doctor.    If you experience loose stools or diarrhea, hold the medications until you stool forms back up.  If your symptoms do not get better within 1 week or if they get worse, check with your doctor.  If you experience "the worst abdominal pain ever" or develop nausea or vomiting, please contact the office immediately for further recommendations for treatment.   ITCHING:  If you experience itching with your medications, try taking only a single  pain pill, or even half a pain pill at a time.  You can also use Benadryl over the counter for itching or also to help with sleep.   TED HOSE STOCKINGS:  Use stockings on both legs until for at least 2 weeks or as directed by physician office. They may be removed at night for sleeping.  MEDICATIONS:  See your medication summary on the After Visit Summary that nursing will review with you.  You may have some home medications which will be placed on hold until you complete the course of blood thinner medication.  It is important for you to complete the blood  thinner medication as prescribed.  PRECAUTIONS:  If you experience chest pain or shortness of breath - call 911 immediately for transfer to the hospital emergency department.   If you develop a fever greater that 101 F, purulent drainage from wound, increased redness or drainage from wound, foul odor from the wound/dressing, or calf pain - CONTACT YOUR SURGEON.                                                   FOLLOW-UP APPOINTMENTS:  If you do not already have a post-op appointment, please call the office for an appointment to be seen by your surgeon.  Guidelines for how soon to be seen are listed in your After Visit Summary, but are typically between 1-4 weeks after surgery.  OTHER INSTRUCTIONS:     MAKE SURE YOU:   Understand these instructions.   Get help right away if you are not doing well or get worse.    Thank you for letting us be a part of your medical care team.  It is a privilege we respect greatly.  We hope these instructions will help you stay on track for a fast and full recovery!

## 2017-04-20 NOTE — Progress Notes (Signed)
Physical Therapy Evaluation Patient Details Name: KHRISTY KALAN MRN: [id number] DOB: 02/11/1970 Today's Date: 04/20/2017   History of Present Illness  Felissa Blouch  is a 47 y.o. female with a known history of pulmonary disorder, tobacco, cocaine, marijuana and alcohol abuse. Patient was brought to emergency room status post mechanical fall at work.  Patient says she accidentally slipped and fell on her left side.  She complains of severe left hip pain with range of motion of left hip joint.  She took 400 mg Motrin right after she fell and this helped somewhat with her pain.  She denies loss of consciousness but she did bump her head during the fall.  No nausea, vomiting, no fever/chills. Blood test done in emergency room including CBC and BMP are essentially within normal limits.  The urine drug test screen is positive for cocaine, opiates, marijuana and benzodiazepines. Left hip x-ray showed left subcapital femoral neck fracture with impaction of the superolateral aspect of the femoral neck on femoral head. PT is POD#1 s/p L hip pinning.  Clinical Impression  Pt admitted with above diagnosis. Pt currently with functional limitations due to the deficits listed below (see PT Problem List). Pt provided cues for safe hand placement and instruction for PWB status during transfers. Good sequencing noted and good stability in standing. Pt ambulates to rehab gym with therapist. Cues for proper sequencing with walker for PWB on LLE. Denies DOE during ambulation. Good stability with UE support on walker. Gait speed is decreased but [facility] for limited community mobility. Pt able to ascend 2 steps backwards and descend forwards. Education provided for proper sequencing with stairs. Pt demonstrates good safety and stability without concern for entering/exiting home at discharge. Pt is safe to return home at discharge with support from family. She prefers OP PT due to cost. She will need a rolling walker. Pt will  benefit from PT services to address deficits in strength, balance, and mobility in order to return to full function at home.        Follow Up Recommendations Outpatient PT    Equipment Recommendations  Rolling walker with 5" wheels    Recommendations for Other Services       Precautions / Restrictions Precautions Precautions: Fall Restrictions Weight Bearing Restrictions: Yes LLE Weight Bearing: Partial weight bearing      Mobility  Bed Mobility               General bed mobility comments: Pt received transferring from commode and left upright in recliner. Bed mobility not assessed  Transfers Overall transfer level: Needs assistance Equipment used: Rolling walker (2 wheeled) Transfers: Sit to/from Stand Sit to Stand: Min guard         General transfer comment: Pt provided cues for safe hand placement and instruction for PWB status. Good sequencing noted with transfers and good stability in standing  Ambulation/Gait Ambulation/Gait assistance: Min guard Ambulation Distance (Feet): 220 Feet Assistive device: Rolling walker (2 wheeled) Gait Pattern/deviations: Decreased weight shift to left;Step-to pattern   Gait velocity interpretation: <1.8 ft/sec, indicative of risk for recurrent falls General Gait Details: Pt ambulates to rehab gym with therapist. Cues for proper sequencing with walker for PWB on LLE. Denies DOE during ambulation. Good stability with UE support on walker. Gait speed is decreased but [facility] for limited community mobility  Stairs Stairs: Yes Stairs assistance: Min guard Stair Management: With walker;Step to pattern Number of Stairs: 2 General stair comments: Pt able to ascend 2 steps backwards and  descend forwards. Education provided for proper sequencing with stairs. Pt demonstrates good safety and stability   Wheelchair Mobility    Modified Rankin (Stroke Patients Only)       Balance Overall balance assessment: Needs  assistance Sitting-balance support: No upper extremity supported Sitting balance-Leahy Scale: Good     Standing balance support: No upper extremity supported Standing balance-Leahy Scale: Fair Standing balance comment: Pt able to balance with even weight distribution on bilateral LEs                             Pertinent Vitals/Pain Pain Assessment: 0-10 Pain Score: 5  Pain Location: L hip Pain Descriptors / Indicators: Aching Pain Intervention(s): Limited activity within patient's tolerance;Monitored during session;Premedicated before session    Home Living Family/patient expects to be discharged to:: Private residence Living Arrangements: Spouse/significant other;Other relatives(Sister) Available Help at Discharge: Family Type of Home: House Home Access: Stairs to enter Entrance Stairs-Rails: None Entrance Stairs-Number of Steps: 2 Home Layout: One level Home Equipment: None      Prior Function Level of Independence: Independent         Comments: Was previously independent with ADLs/IADLs. No other falls reported. Ambulates without assistive device full community distances. Pt works full time     Higher education careers adviser   Dominant Hand: Right    Extremity/Trunk Assessment   Upper Extremity Assessment Upper Extremity Assessment: Overall WFL for tasks assessed    Lower Extremity Assessment Lower Extremity Assessment: LLE deficits/detail LLE Deficits / Details: Pt with generalized weakness secondary to pain in LLE. No focal weakness. Pt denies LLE numbness/tingling       Communication   Communication: No difficulties  Cognition Arousal/Alertness: Awake/alert Behavior During Therapy: WFL for tasks assessed/performed Overall Cognitive Status: Within Functional Limits for tasks assessed                                        General Comments      Exercises     Assessment/Plan    PT Assessment Patient needs continued PT services  PT  Problem List Decreased strength;Decreased balance;Decreased mobility;Pain       PT Treatment Interventions DME instruction;Gait training;Stair training;Functional mobility training;Therapeutic activities;Therapeutic exercise;Balance training;Neuromuscular re-education;Patient/family education    PT Goals (Current goals can be found in the Care Plan section)  Acute Rehab PT Goals Patient Stated Goal: Return to prior function PT Goal Formulation: With patient Time For Goal Achievement: 05/04/17 Potential to Achieve Goals: Good    Frequency BID   Barriers to discharge        Co-evaluation               AM-PAC PT "6 Clicks" Daily Activity  Outcome Measure Difficulty turning over in bed (including adjusting bedclothes, sheets and blankets)?: A Little Difficulty moving from lying on back to sitting on the side of the bed? : A Little Difficulty sitting down on and standing up from a chair with arms (e.g., wheelchair, bedside commode, etc,.)?: A Little Help needed moving to and from a bed to chair (including a wheelchair)?: A Little Help needed walking in hospital room?: A Little Help needed climbing 3-5 steps with a railing? : A Little 6 Click Score: 18    End of Session Equipment Utilized During Treatment: Gait belt Activity Tolerance: Patient tolerated treatment well Patient left: in chair;with call bell/phone  within reach;with SCD's reapplied Nurse Communication: Mobility status PT Visit Diagnosis: Other abnormalities of gait and mobility (R26.89);Difficulty in walking, not elsewhere classified (R26.2);Pain Pain - Right/Left: Left Pain - part of body: Hip    Time: [id number] PT Time Calculation (min) (ACUTE ONLY): 26 min   Charges:   PT Evaluation $PT Eval Low Complexity: 1 Low     PT G Codes:        Rohan Juenger D Jahlani Lorentz PT, DPT    Chantale Leugers 04/20/2017, 1:02 PM

## 2017-04-20 NOTE — Discharge Summary (Addendum)
[facility]   PATIENT NAME: Nicole Macdonald    MR#:  [id number]  DATE OF BIRTH:  1970/12/12  DATE OF ADMISSION:  04/18/2017 ADMITTING PHYSICIAN: Cammy Copa, MD  DATE OF DISCHARGE: 04/20/2017  PRIMARY CARE PHYSICIAN: Patient, No Pcp Per    ADMISSION DIAGNOSIS:  Laceration of right hand without foreign body, initial encounter [S61.411A] Closed left hip fracture, initial encounter (HCC) [S72.002A]  DISCHARGE DIAGNOSIS:  Active Problems:   Hip fx (HCC)   SECONDARY DIAGNOSIS:   Past Medical History:  Diagnosis Date  . Bipolar 1 disorder (HCC)   . Manic depression [facility])     HOSPITAL COURSE:   47 year old female with history of tobacco abuse and bipolar who had a mechanical fall at work.  1.  Left subcapital femoral neck fracture with impaction of the superolateral aspect of the femoral neck on femoral head: Patient is postoperative day #1 and doing well.  Patient will follow up with orthopedic surgery within 2 weeks.  She will continue ASA 325 mg daily for 6 weeks for DVT prophylaxis for 2 weeks.    2.  Bipolar: Continue outpatient medications including Adderall, Klonopin, Prozac  3.  Polysubstance abuse: Patient encouraged to remain abstinent from illicit drugs  4.Tobacco dependence: Patient is encouraged to quit smoking. Counseling was provided for 4 minutes. She will be discharged with nicotine patch     DISCHARGE CONDITIONS AND DIET:   Stable for discharge on regular diet  CONSULTS OBTAINED:  Treatment Team:  Christena Flake, MD  DRUG ALLERGIES:  No Known Allergies  DISCHARGE MEDICATIONS:   Allergies as of 04/20/2017   No Known Allergies     Medication List    STOP taking these medications   ibuprofen 600 MG tablet Commonly known as:  ADVIL,MOTRIN     TAKE these medications   albuterol 108 (90 Base) MCG/ACT inhaler Commonly known as:  PROVENTIL HFA;VENTOLIN HFA Inhale 2 puffs into the lungs every 6  (six) hours as needed for wheezing or shortness of breath.   amphetamine-dextroamphetamine 20 MG tablet Commonly known as:  ADDERALL Take 20 mg by mouth daily.   aspirin EC 325 MG tablet Take 1 tablet (325 mg total) by mouth daily.   calcium-vitamin D 500-200 MG-UNIT tablet Commonly known as:  OSCAL 500/200 D-3 Take 1 tablet by mouth 2 (two) times daily.   clonazePAM 2 MG tablet Commonly known as:  KLONOPIN Take 2 mg by mouth 4 (four) times daily.   docusate sodium 100 MG capsule Commonly known as:  COLACE Take 1 capsule (100 mg total) by mouth 2 (two) times daily.   FLUoxetine 20 MG capsule Commonly known as:  PROZAC Take 20 mg by mouth daily.   HYDROcodone-acetaminophen 5-325 MG tablet Commonly known as:  NORCO/VICODIN Take 1-2 tablets by mouth every 4 (four) hours as needed for moderate pain (pain score 4-6).   Lidocaine 4 % Ptch Place onto the skin.   nicotine 21 mg/24hr patch Commonly known as:  NICODERM CQ - dosed in mg/24 hours Place 1 patch (21 mg total) onto the skin daily.            Durable Medical Equipment  (From admission, onward)        Start     Ordered   04/20/17 0847  For home use only DME Dan Humphreys  [facility])  Once    Question:  Patient needs a walker to treat with the following condition  Answer:  Hip fracture (HCC)  04/20/17 0847        Today   CHIEF COMPLAINT:   Patient doing very well this morning.  Ambulated to the bathroom without pain   VITAL SIGNS:  Blood pressure 104/89, pulse 72, temperature 98.3 F (36.8 C), temperature source Oral, resp. rate 18, height 5\' 3"  (1.6 m), weight 68 kg (150 lb), SpO2 98 %.   REVIEW OF SYSTEMS:  Review of Systems  Constitutional: Negative.  Negative for chills, fever and malaise/fatigue.  HENT: Negative.  Negative for ear discharge, ear pain, hearing loss, nosebleeds and sore throat.   Eyes: Negative.  Negative for blurred vision and pain.  Respiratory: Negative.  Negative for cough,  hemoptysis, shortness of breath and wheezing.   Cardiovascular: Negative.  Negative for chest pain, palpitations and leg swelling.  Gastrointestinal: Negative.  Negative for abdominal pain, blood in stool, diarrhea, nausea and vomiting.  Genitourinary: Negative.  Negative for dysuria.  Musculoskeletal: Negative.  Negative for back pain.  Skin: Negative.   Neurological: Negative for dizziness, tremors, speech change, focal weakness, seizures and headaches.  Endo/Heme/Allergies: Negative.  Does not bruise/bleed easily.  Psychiatric/Behavioral: Negative.  Negative for depression, hallucinations and suicidal ideas.     PHYSICAL EXAMINATION:  GENERAL:  47 y.o.-year-old patient lying in the bed with no acute distress.  NECK:  Supple, no jugular venous distention. No thyroid enlargement, no tenderness.  LUNGS: Normal breath sounds bilaterally, no wheezing, rales,rhonchi  No use of accessory muscles of respiration.  CARDIOVASCULAR: S1, S2 normal. No murmurs, rubs, or gallops.  ABDOMEN: Soft, non-tender, non-distended. Bowel sounds present. No organomegaly or mass.  EXTREMITIES: No pedal edema, cyanosis, or clubbing.  PSYCHIATRIC: The patient is alert and oriented x 3.  SKIN: No obvious rash, lesion, or ulcer.   DATA REVIEW:   CBC Recent Labs  Lab 04/20/17 0344  WBC 9.1  HGB 10.7*  HCT 32.2*  PLT 166    Chemistries  Recent Labs  Lab 04/18/17 2253 04/20/17 0344  NA 140 140  K 3.5 3.9  CL 107 110  CO2 26 24  GLUCOSE 84 135*  BUN 16 14  CREATININE 0.78 0.73  CALCIUM 8.9 8.6*  AST 24  --   ALT 16  --   ALKPHOS 52  --   BILITOT 0.4  --     Cardiac Enzymes No results for input(s): TROPONINI in the last 168 hours.  Microbiology Results  @MICRORSLT48 @  RADIOLOGY:  Dg Hip Operative Unilat W Or W/o Pelvis Left  Result Date: 04/19/2017 CLINICAL DATA:  Valgus impacted LEFT femoral neck fracture. EXAM: OPERATIVE LEFT HIP (WITH PELVIS IF PERFORMED) 3 VIEWS TECHNIQUE:  Fluoroscopic spot image(s) were submitted for interpretation post-operatively. COMPARISON:  Plain films 04/18/2017. FINDINGS: In situ cannulated screws have been placed across the fracture. Satisfactory position and alignment. IMPRESSION: ORIF LEFT hip fracture as described. Electronically Signed   By: Elsie Stain M.D.   On: 04/19/2017 14:28   Dg Hip Unilat With Pelvis 2-3 Views Left  Result Date: 04/18/2017 CLINICAL DATA:  Patient slipped and fell onto left side and presents with left hip pain. EXAM: DG HIP (WITH OR WITHOUT PELVIS) 2-3V LEFT COMPARISON:  None. FINDINGS: Subcapital left femoral neck impaction fracture with slight valgus angulation of the left proximal femur is noted. No joint dislocation. The bony pelvis appears intact. There is lower lumbar facet arthropathy. No soft tissue mass or mineralization. IMPRESSION: Left subcapital femoral neck fracture with impaction of the superolateral aspect of the femoral neck on femoral head. Electronically  Signed   By: Tollie Ethavid  Kwon M.D.   On: 04/18/2017 22:38      Allergies as of 04/20/2017   No Known Allergies     Medication List    STOP taking these medications   ibuprofen 600 MG tablet Commonly known as:  ADVIL,MOTRIN     TAKE these medications   albuterol 108 (90 Base) MCG/ACT inhaler Commonly known as:  PROVENTIL HFA;VENTOLIN HFA Inhale 2 puffs into the lungs every 6 (six) hours as needed for wheezing or shortness of breath.   amphetamine-dextroamphetamine 20 MG tablet Commonly known as:  ADDERALL Take 20 mg by mouth daily.   aspirin EC 325 MG tablet Take 1 tablet (325 mg total) by mouth daily.   calcium-vitamin D 500-200 MG-UNIT tablet Commonly known as:  OSCAL 500/200 D-3 Take 1 tablet by mouth 2 (two) times daily.   clonazePAM 2 MG tablet Commonly known as:  KLONOPIN Take 2 mg by mouth 4 (four) times daily.   docusate sodium 100 MG capsule Commonly known as:  COLACE Take 1 capsule (100 mg total) by mouth 2 (two)  times daily.   FLUoxetine 20 MG capsule Commonly known as:  PROZAC Take 20 mg by mouth daily.   HYDROcodone-acetaminophen 5-325 MG tablet Commonly known as:  NORCO/VICODIN Take 1-2 tablets by mouth every 4 (four) hours as needed for moderate pain (pain score 4-6).   Lidocaine 4 % Ptch Place onto the skin.   nicotine 21 mg/24hr patch Commonly known as:  NICODERM CQ - dosed in mg/24 hours Place 1 patch (21 mg total) onto the skin daily.            Durable Medical Equipment  (From admission, onward)        Start     Ordered   04/20/17 0847  For home use only DME Dan HumphreysWalker  [facility]  Once    Question:  Patient needs a walker to treat with the following condition  Answer:  Hip fracture [facility](HCC)   04/20/17 0847       Management plans discussed with the patient and she is in agreement. Stable for discharge home   Patient should follow up with ortho  CODE STATUS:     Code Status Orders  (From admission, onward)        Start     Ordered   04/18/17 2356  Full code  Continuous     04/18/17 2355    Code Status History    Date Active Date Inactive Code Status Order ID Comments User Context   This patient has a current code status but no historical code status.      TOTAL TIME TAKING CARE OF THIS PATIENT: 38 minutes.    Note: This dictation was prepared with Dragon dictation along with smaller phrase technology. Any transcriptional errors that result from this process are unintentional.  Jaelin Fackler M.D on 04/20/2017 at 10:22 AM  Between 7am to 6pm - Pager - [phone] After 6pm go to [email or web address] - password [email or web address]  [facility]  Office  [phone]  CC: Primary care physician; Patient, No Pcp Per

## 2017-04-21 ENCOUNTER — Encounter: Payer: Self-pay | Admitting: Surgery

## 2017-04-22 NOTE — Progress Notes (Signed)
Pt given milk of magnesium this morning, Per MD Poggi pt can discharge without having a bowel movement. Pt notified to continue stool softeners at home and notify MD if she hasn't had a bowel in a few days. Pt verbalized understanding.

## 2017-08-27 LAB — HIV ANTIBODY (ROUTINE TESTING W REFLEX): HIV SCREEN 4TH GENERATION: NONREACTIVE

## 2018-11-12 ENCOUNTER — Emergency Department: Payer: Self-pay

## 2018-11-12 ENCOUNTER — Other Ambulatory Visit: Payer: Self-pay

## 2018-11-12 ENCOUNTER — Emergency Department
Admission: EM | Admit: 2018-11-12 | Discharge: 2018-11-13 | Disposition: A | Discharge status: Home / Self Care | Payer: Self-pay | Attending: Emergency Medicine | Admitting: Emergency Medicine

## 2018-11-12 DIAGNOSIS — F1994 Other psychoactive substance use, unspecified with psychoactive substance-induced mood disorder: Secondary | ICD-10-CM | POA: Insufficient documentation

## 2018-11-12 DIAGNOSIS — T405X1A Poisoning by cocaine, accidental (unintentional), initial encounter: Secondary | ICD-10-CM | POA: Insufficient documentation

## 2018-11-12 DIAGNOSIS — F329 Major depressive disorder, single episode, unspecified: Secondary | ICD-10-CM | POA: Insufficient documentation

## 2018-11-12 DIAGNOSIS — S72009A Fracture of unspecified part of neck of unspecified femur, initial encounter for closed fracture: Secondary | ICD-10-CM | POA: Diagnosis present

## 2018-11-12 DIAGNOSIS — F17210 Nicotine dependence, cigarettes, uncomplicated: Secondary | ICD-10-CM | POA: Insufficient documentation

## 2018-11-12 DIAGNOSIS — T50901A Poisoning by unspecified drugs, medicaments and biological substances, accidental (unintentional), initial encounter: Secondary | ICD-10-CM

## 2018-11-12 DIAGNOSIS — Y907 Blood alcohol level of 200-239 mg/100 ml: Secondary | ICD-10-CM | POA: Insufficient documentation

## 2018-11-12 DIAGNOSIS — F341 Dysthymic disorder: Secondary | ICD-10-CM | POA: Diagnosis present

## 2018-11-12 DIAGNOSIS — F10929 Alcohol use, unspecified with intoxication, unspecified: Secondary | ICD-10-CM | POA: Insufficient documentation

## 2018-11-12 DIAGNOSIS — F1410 Cocaine abuse, uncomplicated: Secondary | ICD-10-CM

## 2018-11-12 DIAGNOSIS — F1010 Alcohol abuse, uncomplicated: Secondary | ICD-10-CM | POA: Diagnosis present

## 2018-11-12 DIAGNOSIS — Z20828 Contact with and (suspected) exposure to other viral communicable diseases: Secondary | ICD-10-CM | POA: Insufficient documentation

## 2018-11-12 LAB — COMPREHENSIVE METABOLIC PANEL
ALT: 31 U/L (ref 0–44)
AST: 29 U/L (ref 15–41)
Albumin: 4.1 g/dL (ref 3.5–5.0)
Alkaline Phosphatase: 54 U/L (ref 38–126)
Anion gap: 9 (ref 5–15)
BUN: 11 mg/dL (ref 6–20)
CO2: 24 mmol/L (ref 22–32)
Calcium: 9 mg/dL (ref 8.9–10.3)
Chloride: 111 mmol/L (ref 98–111)
Creatinine, Ser: 0.73 mg/dL (ref 0.44–1.00)
GFR calc Af Amer: >60 mL/min (ref >60)
GFR calc non Af Amer: >60 mL/min (ref >60)
Glucose, Bld: 90 mg/dL (ref 70–99)
Potassium: 3.9 mmol/L (ref 3.5–5.1)
Sodium: 144 mmol/L (ref 135–145)
Total Bilirubin: 0.5 mg/dL (ref 0.3–1.2)
Total Protein: 7.1 g/dL (ref 6.5–8.1)

## 2018-11-12 LAB — URINE DRUG SCREEN, QUALITATIVE (ARMC ONLY)
Amphetamines, Ur Screen: NOT DETECTED
Barbiturates, Ur Screen: NOT DETECTED
Benzodiazepine, Ur Scrn: NOT DETECTED
Cannabinoid 50 Ng, Ur ~~LOC~~: NOT DETECTED
Cocaine Metabolite,Ur ~~LOC~~: NOT DETECTED
MDMA (Ecstasy)Ur Screen: NOT DETECTED
Methadone Scn, Ur: NOT DETECTED
Opiate, Ur Screen: NOT DETECTED
Phencyclidine (PCP) Ur S: NOT DETECTED
Tricyclic, Ur Screen: NOT DETECTED

## 2018-11-12 LAB — TROPONIN I (HIGH SENSITIVITY): Troponin I (High Sensitivity): <2 ng/L (ref <18)

## 2018-11-12 LAB — CBC WITH DIFFERENTIAL/PLATELET
Abs Immature Granulocytes: 0.02 10*3/uL (ref 0.00–0.07)
Basophils Absolute: 0.1 10*3/uL (ref 0.0–0.1)
Basophils Relative: 1 %
Eosinophils Absolute: 0.1 10*3/uL (ref 0.0–0.5)
Eosinophils Relative: 2 %
HCT: 39.3 % (ref 36.0–46.0)
Hemoglobin: 13.1 g/dL (ref 12.0–15.0)
Immature Granulocytes: 0 %
Lymphocytes Relative: 47 %
Lymphs Abs: 2.5 10*3/uL (ref 0.7–4.0)
MCH: 33.3 pg (ref 26.0–34.0)
MCHC: 33.3 g/dL (ref 30.0–36.0)
MCV: 100 fL (ref 80.0–100.0)
Monocytes Absolute: 0.3 10*3/uL (ref 0.1–1.0)
Monocytes Relative: 6 %
Neutro Abs: 2.4 10*3/uL (ref 1.7–7.7)
Neutrophils Relative %: 44 %
Platelets: 237 10*3/uL (ref 150–400)
RBC: 3.93 MIL/uL (ref 3.87–5.11)
RDW: 12.8 % (ref 11.5–15.5)
WBC: 5.4 10*3/uL (ref 4.0–10.5)
nRBC: 0 % (ref 0.0–0.2)

## 2018-11-12 LAB — ETHANOL: Alcohol, Ethyl (B): 215 mg/dL — ABNORMAL HIGH (ref <10)

## 2018-11-12 LAB — SARS CORONAVIRUS 2 BY RT PCR (HOSPITAL ORDER, PERFORMED IN ~~LOC~~ HOSPITAL LAB): SARS Coronavirus 2: NEGATIVE

## 2018-11-12 MED ORDER — SODIUM CHLORIDE 0.9 % IV SOLN
Freq: Once | INTRAVENOUS | Status: AC
  Administered 2018-11-12: 20:00:00 via INTRAVENOUS

## 2018-11-12 MED ORDER — KETOROLAC TROMETHAMINE 30 MG/ML IJ SOLN
30.0000 mg | Freq: Once | INTRAMUSCULAR | Status: AC
  Administered 2018-11-12: 20:00:00 30 mg via INTRAVENOUS
  Filled 2018-11-12: qty 1

## 2018-11-12 MED ORDER — ONDANSETRON HCL 4 MG/2ML IJ SOLN
4.0000 mg | Freq: Once | INTRAMUSCULAR | Status: AC
  Administered 2018-11-12: 20:00:00 4 mg via INTRAVENOUS
  Filled 2018-11-12: qty 2

## 2018-11-12 NOTE — ED Notes (Signed)
Pt gives verbal okay to update sister Patty Sermons at [phone]. This RN attempted to call and update but no answer and voicemail full.

## 2018-11-12 NOTE — ED Notes (Signed)
Pt understands that a urine sample is needed. Pt given new mask and tissues as requested. Xray staff at bedside.

## 2018-11-12 NOTE — ED Provider Notes (Signed)
[facility] Emergency Department Provider Note       Time seen: ----------------------------------------- 7:34 PM on 11/12/2018 -----------------------------------------   I have reviewed the triage vital signs and the nursing notes.  HISTORY   Chief Complaint Drug Overdose    HPI Nicole Macdonald is a 48 y.o. female with a history of bipolar disorder, alcohol abuse, dysthymia who presents to the ED for altered mental status.  Patient reportedly was altered earlier today after using cocaine.  Patient states she did "a bump" of cocaine.  She also has been around numerous family members and friends with COVID-19.  Has a cough and has felt ill for about a week.  Past Medical History:  Diagnosis Date  . Bipolar 1 disorder (HCC)   . Manic depression (HCC)     Patient Active Problem List   Diagnosis Date Noted  . Hip fx (HCC) 04/18/2017  . Alcohol abuse 09/06/2014  . Substance induced mood disorder (HCC) 09/06/2014  . Dysthymia 09/06/2014    Past Surgical History:  Procedure Laterality Date  . COMPRESSION HIP SCREW Left 04/19/2017   Procedure: left hip cannulated screw pinning;  Surgeon: Christena Flake, MD;  Location: [facility] ORS;  Service: Orthopedics;  Laterality: Left;  . denies      Allergies Patient has no known allergies.  Social History Social History   Tobacco Use  . Smoking status: Current Every Day Smoker    Packs/day: 0.50    Types: Cigarettes  . Smokeless tobacco: Never Used  Substance Use Topics  . Alcohol use: Yes  . Drug use: Yes    Types: Cocaine, Marijuana   Review of Systems Constitutional: Negative for fever. Cardiovascular: Negative for chest pain. Respiratory: Positive for shortness of breath and cough Gastrointestinal: Negative for abdominal pain, vomiting and diarrhea. Musculoskeletal: Negative for back pain. Skin: Negative for rash. Neurological: Negative for headaches, positive for weakness  All systems  negative/normal/unremarkable except as stated in the HPI  ____________________________________________   PHYSICAL EXAM:  VITAL SIGNS: ED Triage Vitals  Enc Vitals Group     BP      Pulse      Resp      Temp      Temp src      SpO2      Weight      Height      Head Circumference      Peak Flow      Pain Score      Pain Loc      Pain Edu?      Excl. in GC?    Constitutional: Alert and oriented.  Mildly agitated, mild distress.  Disheveled appearance Eyes: Conjunctivae are normal. Normal extraocular movements. ENT      Head: Normocephalic and atraumatic.      Nose: No congestion/rhinnorhea.      Mouth/Throat: Mucous membranes are moist.      Neck: No stridor. Cardiovascular: Normal rate, regular rhythm. No murmurs, rubs, or gallops. Respiratory: Normal respiratory effort without tachypnea nor retractions. Breath sounds are clear and equal bilaterally. No wheezes/rales/rhonchi. Gastrointestinal: Soft and nontender. Normal bowel sounds Musculoskeletal: Nontender with normal range of motion in extremities. No lower extremity tenderness nor edema. Neurologic:  Normal speech and language. No gross focal neurologic deficits are appreciated.  Skin:  Skin is warm, dry and intact. No rash noted. Psychiatric: Elevated mood ____________________________________________  ED COURSE:  As part of my medical decision making, I reviewed the following data within the electronic medical  record:  History obtained from family if available, nursing notes, old chart and ekg, as well as notes from prior ED visits. Patient presented for substance abuse and possible COVID, we will assess with labs and imaging as indicated at this time.  EKG: Interpreted by me, sinus rhythm with rate of 73 bpm, normal PR interval, normal QRS, normal QT Clinical Course as of Nov 12 2119  Thu Nov 12, 2018  2027 Alcohol, Ethyl (B)(!): 215 [JW]    Clinical Course User Index [JW] Earleen Newport, MD    Procedures  [place] was evaluated in Emergency Department on 11/12/2018 for the symptoms described in the history of present illness. She was evaluated in the context of the global COVID-19 pandemic, which necessitated consideration that the patient might be at risk for infection with the SARS-CoV-2 virus that causes COVID-19. Institutional protocols and algorithms that pertain to the evaluation of patients at risk for COVID-19 are in a state of rapid change based on information released by regulatory bodies including the CDC and federal and state organizations. These policies and algorithms were followed during the patient's care in the ED.  ____________________________________________   LABS (pertinent positives/negatives)  Labs Reviewed  ETHANOL - Abnormal; Notable for the following components:      Result Value   Alcohol, Ethyl (B) 215 (*)    All other components within normal limits  SARS CORONAVIRUS 2 BY RT PCR (HOSPITAL ORDER, [facility])  CBC WITH DIFFERENTIAL/PLATELET  COMPREHENSIVE METABOLIC PANEL  URINE DRUG SCREEN, QUALITATIVE ([facility] ONLY)  TROPONIN I (HIGH SENSITIVITY)    RADIOLOGY Images were viewed by me  Chest x-ray Is unremarkable ____________________________________________   DIFFERENTIAL DIAGNOSIS   Substance abuse, intoxication, dehydration, electrolyte abnormality, COVID-19  FINAL ASSESSMENT AND PLAN  Cocaine abuse, cough, depression, alcohol intoxication   Plan: The patient had presented for recent cocaine use and COVID-19 symptoms. Patient's labs did reveal alcohol intoxication, she admittedly used cocaine tonight. Patient's imaging was unremarkable when she was COVID negative.  Patient is depressed and tearful and seems to be under significant stress.  She is willing to talk to psychiatry about possible treatment options.   Laurence Aly, MD    Note: This note was generated in part or whole with voice  recognition software. Voice recognition is usually quite accurate but there are transcription errors that can and very often do occur. I apologize for any typographical errors that were not detected and corrected.     Earleen Newport, MD 11/12/18 2122

## 2018-11-12 NOTE — ED Notes (Signed)
Pt given cup of iced water with verbal okay from [place].

## 2018-11-12 NOTE — ED Notes (Signed)
EDP Williams at bedside to update pt and sister again.

## 2018-11-12 NOTE — ED Notes (Signed)
Pt's sister Eustaquio Maize to bedside.

## 2018-11-12 NOTE — ED Notes (Signed)
[place] sent to lab

## 2018-11-12 NOTE — ED Triage Notes (Signed)
Pt in via EMS from home when significant other found pt slumped over in bathroom having just used cocaine per pt. Pt given 4mg  Zofran IM and 4mg  Narcan by EMS. Pt now alert and oriented. 150/70 BP, 72HR, NSR with EMS. Likely covid positive as pt has been around friend who is covid positive and have c/o cough and other resp issues for last few days.

## 2018-11-13 DIAGNOSIS — R45851 Suicidal ideations: Secondary | ICD-10-CM

## 2018-11-13 NOTE — Consult Note (Signed)
[facility] Face-to-Face Psychiatry Consult   Reason for Consult: Drug overdose Referring Physician: Dr. Mayford Knife Patient Identification: Nicole Macdonald MRN:  [id number] Principal Diagnosis: Substance induced mood disorder (HCC) Diagnosis:  Principal Problem:   Substance induced mood disorder (HCC) Active Problems:   Alcohol abuse   Dysthymia   Hip fx (HCC)  Total Time spent with patient: 1 hour  Subjective: "I was doing so well.  I had my use for over a week." Nicole Macdonald is a 48 y.o. female patient presented to [facility] ED via EMS from home due to significant other finding the patient slumped over in her bathroom. The patient states she has been drinking and used cocaine.  In route to the ED, the patient was given 4 mg of Zofran IM and 4 mg Narcan by EMS.  Initially believing the patient was thought to be COVID positive due to the patient voicing she had been around a friend who was diagnosed with the illness.  The patient was seen face-to-face by this provider; chart reviewed and consulted with Dr. Mayford Knife on 11/12/2018 due to the patient's care. It was discussed with Dr. Mayford Knife and Dr. Toni Amend that the patient does not meet the criteria to be admitted to the psychiatric inpatient unit. The patient is alert and oriented x4, calm, emotional, cooperative, and mood-congruent with affect on evaluation. The patient does not appear to be responding to internal or external stimuli. Neither is the patient presenting with any delusional thinking. The patient denies auditory or visual hallucinations. The patient denies any suicidal, homicidal, or self-harm ideations. The patient is not presenting with any psychotic or paranoid behaviors. During an encounter with the patient, she was able to answer questions appropriately. Collateral was obtained by the patient's sister Ms. [name] [phone], who is at the bedside and expresses concerns that the patient UDS does not show any drugs in her  system.  The patient voiced she used a minimal amount of cocaine this day. The patient, Ms. Teola Bradley, expressed she is here to support her sister and make sure that she is safe.  She voiced monitoring her by ensuring she has no access to any weapons, alcohol, or drugs.  She states that she will be by her sister's side as she works through getting free of substance use. Plan: The patient is not a safety risk to self or others and does not require psychiatric inpatient admission for stabilization and treatment.   HPI: Per Dr. Mayford Knife; Nicole Macdonald is a 48 y.o. female with a history of bipolar disorder, alcohol abuse, dysthymia who presents to the ED for altered mental status.  Patient reportedly was altered earlier today after using cocaine.  Patient states she did "a bump" of cocaine.  She also has been around numerous family members and friends with COVID-19.  Has a cough and has felt ill for about a week.  Past Psychiatric History:  Bipolar 1 disorder (HCC) Manic depression (HCC)  Risk to Self:  No Risk to Others:  No Prior Inpatient Therapy:  No Prior Outpatient Therapy:  Yes  Past Medical History:  Past Medical History:  Diagnosis Date  . Bipolar 1 disorder (HCC)   . Manic depression (HCC)     Past Surgical History:  Procedure Laterality Date  . COMPRESSION HIP SCREW Left 04/19/2017   Procedure: left hip cannulated screw pinning;  Surgeon: Christena Flake, MD;  Location: [facility] ORS;  Service: Orthopedics;  Laterality: Left;  . denies     Family  History: History reviewed. No pertinent family history. Family Psychiatric  History: Maternal-alcohol abuse Social History:  Social History   Substance and Sexual Activity  Alcohol Use Yes     Social History   Substance and Sexual Activity  Drug Use Yes  . Types: Cocaine, Marijuana    Social History   Socioeconomic History  . Marital status: Single    Spouse name: Not on file  . Number of children: Not on file  . Years of  education: Not on file  . Highest education level: Not on file  Occupational History  . Not on file  Social Needs  . Financial resource strain: Not on file  . Food insecurity    Worry: Not on file    Inability: Not on file  . Transportation needs    Medical: Not on file    Non-medical: Not on file  Tobacco Use  . Smoking status: Current Every Day Smoker    Packs/day: 0.50    Types: Cigarettes  . Smokeless tobacco: Never Used  Substance and Sexual Activity  . Alcohol use: Yes  . Drug use: Yes    Types: Cocaine, Marijuana  . Sexual activity: Not on file  Lifestyle  . Physical activity    Days per week: Not on file    Minutes per session: Not on file  . Stress: Not on file  Relationships  . Social Musician on phone: Not on file    Gets together: Not on file    Attends religious service: Not on file    Active member of club or organization: Not on file    Attends meetings of clubs or organizations: Not on file    Relationship status: Not on file  Other Topics Concern  . Not on file  Social History Narrative  . Not on file   Additional Social History:    Allergies:  No Known Allergies  Labs:  Results for orders placed or performed during the hospital encounter of 11/12/18 (from the past 48 hour(s))  CBC with Differential/Platelet     Status: None   Collection Time: 11/12/18  7:36 PM  Result Value Ref Range   WBC 5.4 4.0 - 10.5 K/uL   RBC 3.93 3.87 - 5.11 MIL/uL   Hemoglobin 13.1 12.0 - 15.0 g/dL   HCT 16.1 09.6 - 04.5 %   MCV 100.0 80.0 - 100.0 fL   MCH 33.3 26.0 - 34.0 pg   MCHC 33.3 30.0 - 36.0 g/dL   RDW 40.9 81.1 - 91.4 %   Platelets 237 150 - 400 K/uL   nRBC 0.0 0.0 - 0.2 %   Neutrophils Relative % 44 %   Neutro Abs 2.4 1.7 - 7.7 K/uL   Lymphocytes Relative 47 %   Lymphs Abs 2.5 0.7 - 4.0 K/uL   Monocytes Relative 6 %   Monocytes Absolute 0.3 0.1 - 1.0 K/uL   Eosinophils Relative 2 %   Eosinophils Absolute 0.1 0.0 - 0.5 K/uL   Basophils  Relative 1 %   Basophils Absolute 0.1 0.0 - 0.1 K/uL   Immature Granulocytes 0 %   Abs Immature Granulocytes 0.02 0.00 - 0.07 K/uL    Comment: Performed at [facility], [address] [place], [place] [address]  Comprehensive metabolic panel     Status: None   Collection Time: 11/12/18  7:36 PM  Result Value Ref Range   Sodium 144 135 - 145 mmol/L   Potassium 3.9 3.5 -  5.1 mmol/L   Chloride 111 98 - 111 mmol/L   CO2 24 22 - 32 mmol/L   Glucose, Bld 90 70 - 99 mg/dL   BUN 11 6 - 20 mg/dL   Creatinine, Ser 7.62 0.44 - 1.00 mg/dL   Calcium 9.0 8.9 - 26.3 mg/dL   Total Protein 7.1 6.5 - 8.1 g/dL   Albumin 4.1 3.5 - 5.0 g/dL   AST 29 15 - 41 U/L   ALT 31 0 - 44 U/L   Alkaline Phosphatase 54 38 - 126 U/L   Total Bilirubin 0.5 0.3 - 1.2 mg/dL   GFR calc non Af Amer >60 >60 mL/min   GFR calc Af Amer >60 >60 mL/min   Anion gap 9 5 - 15    Comment: Performed at [facility], [address] [place], [place] [address]  Ethanol     Status: Abnormal   Collection Time: 11/12/18  7:36 PM  Result Value Ref Range   Alcohol, Ethyl (B) 215 (H) <10 mg/dL    Comment: (NOTE) Lowest detectable limit for serum alcohol is 10 mg/dL. For medical purposes only. Performed at [facility], [address] [place], [place] [address]   Urine Drug Screen, Qualitative [facility] only)     Status: None   Collection Time: 11/12/18  7:36 PM  Result Value Ref Range   Tricyclic, Ur Screen NONE DETECTED NONE DETECTED   Amphetamines, Ur Screen NONE DETECTED NONE DETECTED   MDMA (Ecstasy)Ur Screen NONE DETECTED NONE DETECTED   Cocaine Metabolite,Ur [place] NONE DETECTED NONE DETECTED   Opiate, Ur Screen NONE DETECTED NONE DETECTED   Phencyclidine (PCP) Ur S NONE DETECTED NONE DETECTED   Cannabinoid 50 Ng, Ur [place] NONE DETECTED NONE DETECTED   Barbiturates, Ur Screen NONE DETECTED NONE DETECTED   Benzodiazepine, Ur Scrn NONE DETECTED NONE DETECTED   Methadone Scn, Ur NONE DETECTED NONE  DETECTED    Comment: (NOTE) Tricyclics + metabolites, urine    Cutoff 1000 ng/mL Amphetamines + metabolites, urine  Cutoff 1000 ng/mL MDMA (Ecstasy), urine              Cutoff 500 ng/mL Cocaine Metabolite, urine          Cutoff 300 ng/mL Opiate + metabolites, urine        Cutoff 300 ng/mL Phencyclidine (PCP), urine         Cutoff 25 ng/mL Cannabinoid, urine                 Cutoff 50 ng/mL Barbiturates + metabolites, urine  Cutoff 200 ng/mL Benzodiazepine, urine              Cutoff 200 ng/mL Methadone, urine                   Cutoff 300 ng/mL The urine drug screen provides only a preliminary, unconfirmed analytical test result and should not be used for non-medical purposes. Clinical consideration and professional judgment should be applied to any positive drug screen result due to possible interfering substances. A more specific alternate chemical method must be used in order to obtain a confirmed analytical result. Gas chromatography / mass spectrometry (GC/MS) is the preferred confirmat ory method. Performed at [facility], [address] [place], [place] [address]   SARS Coronavirus 2 by RT PCR (hospital order, performed in [facility] hospital lab) Nasopharyngeal Nasopharyngeal Swab     Status: None   Collection Time: 11/12/18  7:36 PM   Specimen: Nasopharyngeal Swab  Result Value Ref Range   SARS Coronavirus 2 NEGATIVE NEGATIVE    Comment: (NOTE) If result is NEGATIVE SARS-CoV-2 target nucleic acids are NOT DETECTED. The SARS-CoV-2 RNA is generally detectable in upper and lower  respiratory specimens during the acute phase of infection. The lowest  concentration of SARS-CoV-2 viral copies this assay can detect is 250  copies / mL. A negative result does not preclude SARS-CoV-2 infection  and should not be used as the sole basis for treatment or other  patient management decisions.  A negative result may occur with  improper specimen collection / handling,  submission of specimen other  than nasopharyngeal swab, presence of viral mutation(s) within the  areas targeted by this assay, and inadequate number of viral copies  (<250 copies / mL). A negative result must be combined with clinical  observations, patient history, and epidemiological information. If result is POSITIVE SARS-CoV-2 target nucleic acids are DETECTED. The SARS-CoV-2 RNA is generally detectable in upper and lower  respiratory specimens dur ing the acute phase of infection.  Positive  results are indicative of active infection with SARS-CoV-2.  Clinical  correlation with patient history and other diagnostic information is  necessary to determine patient infection status.  Positive results do  not rule out bacterial infection or co-infection with other viruses. If result is PRESUMPTIVE POSTIVE SARS-CoV-2 nucleic acids MAY BE PRESENT.   A presumptive positive result was obtained on the submitted specimen  and confirmed on repeat testing.  While 2019 novel coronavirus  (SARS-CoV-2) nucleic acids may be present in the submitted sample  additional confirmatory testing may be necessary for epidemiological  and / or clinical management purposes  to differentiate between  SARS-CoV-2 and other Sarbecovirus currently known to infect humans.  If clinically indicated additional testing with an alternate test  methodology [phone]) is advised. The SARS-CoV-2 RNA is generally  detectable in upper and lower respiratory sp ecimens during the acute  phase of infection. The expected result is Negative. Fact Sheet for Patients:  StrictlyIdeas.no Fact Sheet for Healthcare Providers: BankingDealers.co.za This test is not yet approved or cleared by the [place] FDA and has been authorized for detection and/or diagnosis of SARS-CoV-2 by FDA under an Emergency Use Authorization (EUA).  This EUA will remain in effect (meaning this test can be  used) for the duration of the COVID-19 declaration under Section 564(b)(1) of the Act, 21 U.S.C. section 360bbb-3(b)(1), unless the authorization is terminated or revoked sooner. Performed at [facility], [place], [place] [address]   Troponin I (High Sensitivity)     Status: None   Collection Time: 11/12/18  7:38 PM  Result Value Ref Range   Troponin I (High Sensitivity) <2 <18 ng/L    Comment: (NOTE) Elevated high sensitivity troponin I (hsTnI) values and significant  changes across serial measurements may suggest ACS but many other  chronic and acute conditions are known to elevate hsTnI results.  Refer to the "Links" section for chest pain algorithms and additional  guidance. Performed at [facility], [address] [place], [place] [address]     No current facility-administered medications for this encounter.    Current Outpatient Medications  Medication Sig Dispense Refill  . albuterol (PROVENTIL HFA;VENTOLIN HFA) 108 (90 Base) MCG/ACT inhaler Inhale 2 puffs into the lungs every 6 (six) hours as needed for wheezing or shortness of breath. 1 Inhaler 2  . amphetamine-dextroamphetamine (ADDERALL) 20 MG tablet Take 20 mg by mouth daily.    Marland Kitchen  calcium-vitamin D (OSCAL 500/200 D-3) 500-200 MG-UNIT tablet Take 1 tablet by mouth 2 (two) times daily. 180 tablet 3  . clonazePAM (KLONOPIN) 2 MG tablet Take 2 mg by mouth 4 (four) times daily.     Marland Kitchen. docusate sodium (COLACE) 100 MG capsule Take 1 capsule (100 mg total) by mouth 2 (two) times daily. 10 capsule 0  . FLUoxetine (PROZAC) 20 MG capsule Take 20 mg by mouth daily.    . Lidocaine 4 % PTCH Place onto the skin.    . nicotine (NICODERM CQ - DOSED IN MG/24 HOURS) 21 mg/24hr patch Place 1 patch (21 mg total) onto the skin daily. 28 patch 0    Musculoskeletal: Strength & Muscle Tone: within normal limits Gait & Station: unsteady Patient leans: N/A  Psychiatric Specialty Exam: Physical Exam   Nursing note and vitals reviewed. Constitutional: She is oriented to person, place, and time. She appears well-developed.  Neck: Normal range of motion. Neck supple.  Cardiovascular: Normal rate.  Musculoskeletal: Normal range of motion.  Neurological: She is alert and oriented to person, place, and time.  Psychiatric: Her behavior is normal. Judgment and thought content normal.    Review of Systems  Psychiatric/Behavioral: Positive for depression and substance abuse. The patient is nervous/anxious.   All other systems reviewed and are negative.   Blood pressure 120/83, pulse 69, temperature 98.3 F (36.8 C), temperature source Oral, resp. rate (!) 24, height 5\' 3"  (1.6 m), weight 72.6 kg, SpO2 98 %.Body mass index is 28.34 kg/m.  General Appearance: Casual  Eye Contact:  Good  Speech:  Clear and Coherent  Volume:  Normal  Mood:  Anxious and Depressed  Affect:  Appropriate, Congruent, Depressed and Tearful  Thought Process:  Coherent  Orientation:  Full (Time, Place, and Person)  Thought Content:  Logical  Suicidal Thoughts:  No  Homicidal Thoughts:  No  Memory:  Immediate;   Good Recent;   Good Remote;   Good  Judgement:  Good  Insight:  Good  Psychomotor Activity:  Normal  Concentration:  Concentration: Good and Attention Span: Good  Recall:  Good  Fund of Knowledge:  Good  Language:  Good  Akathisia:  Negative  Handed:  Right  AIMS (if indicated):     Assets:  Desire for Improvement Resilience Social Support  ADL's:  Intact  Cognition:  WNL  Sleep:        Treatment Plan Summary: Plan Patient does not meet criteria for psychiatric inpatient admission  Disposition: No evidence of imminent risk to self or others at present.   Patient does not meet criteria for psychiatric inpatient admission. Supportive therapy provided about ongoing stressors. Discussed crisis plan, support from social network, calling 911, coming to the Emergency Department, and calling  Suicide Hotline.  Gillermo MurdochJacqueline Gracen Ringwald, NP 11/13/2018 3:15 AM

## 2018-11-13 NOTE — ED Notes (Signed)
Psychiatry to bedside. 

## 2018-11-16 ENCOUNTER — Ambulatory Visit
Admission: RE | Admit: 2018-11-16 | Discharge: 2018-11-16 | Disposition: A | Discharge status: Home / Self Care | Payer: Disability Insurance | Source: Ambulatory Visit | Attending: Internal Medicine | Admitting: Internal Medicine

## 2018-11-16 ENCOUNTER — Other Ambulatory Visit: Payer: Self-pay | Admitting: Internal Medicine

## 2018-11-16 DIAGNOSIS — R29898 Other symptoms and signs involving the musculoskeletal system: Secondary | ICD-10-CM

## 2018-11-16 DIAGNOSIS — M545 Low back pain, unspecified: Secondary | ICD-10-CM

## 2018-11-16 DIAGNOSIS — M25552 Pain in left hip: Secondary | ICD-10-CM

## 2018-11-16 DIAGNOSIS — M25551 Pain in right hip: Secondary | ICD-10-CM | POA: Insufficient documentation

## 2018-11-16 DIAGNOSIS — M25562 Pain in left knee: Secondary | ICD-10-CM | POA: Insufficient documentation

## 2018-11-16 DIAGNOSIS — M25561 Pain in right knee: Secondary | ICD-10-CM

## 2019-11-05 ENCOUNTER — Emergency Department
Admission: EM | Admit: 2019-11-05 | Discharge: 2019-11-05 | Disposition: A | Discharge status: Home / Self Care | Payer: Medicaid Other | Attending: Emergency Medicine | Admitting: Emergency Medicine

## 2019-11-05 ENCOUNTER — Emergency Department: Payer: Medicaid Other

## 2019-11-05 DIAGNOSIS — F10129 Alcohol abuse with intoxication, unspecified: Secondary | ICD-10-CM | POA: Diagnosis present

## 2019-11-05 DIAGNOSIS — F17210 Nicotine dependence, cigarettes, uncomplicated: Secondary | ICD-10-CM | POA: Diagnosis not present

## 2019-11-05 DIAGNOSIS — R0981 Nasal congestion: Secondary | ICD-10-CM | POA: Insufficient documentation

## 2019-11-05 DIAGNOSIS — F10920 Alcohol use, unspecified with intoxication, uncomplicated: Secondary | ICD-10-CM

## 2019-11-05 DIAGNOSIS — Z79899 Other long term (current) drug therapy: Secondary | ICD-10-CM

## 2019-11-05 DIAGNOSIS — F1310 Sedative, hypnotic or anxiolytic abuse, uncomplicated: Secondary | ICD-10-CM | POA: Diagnosis not present

## 2019-11-05 DIAGNOSIS — R4182 Altered mental status, unspecified: Secondary | ICD-10-CM | POA: Insufficient documentation

## 2019-11-05 LAB — URINE DRUG SCREEN, QUALITATIVE (ARMC ONLY)
Amphetamines, Ur Screen: NOT DETECTED
Barbiturates, Ur Screen: NOT DETECTED
Benzodiazepine, Ur Scrn: NOT DETECTED
Cannabinoid 50 Ng, Ur ~~LOC~~: NOT DETECTED
Cocaine Metabolite,Ur ~~LOC~~: POSITIVE — AB
MDMA (Ecstasy)Ur Screen: NOT DETECTED
Methadone Scn, Ur: NOT DETECTED
Opiate, Ur Screen: NOT DETECTED
Phencyclidine (PCP) Ur S: NOT DETECTED
Tricyclic, Ur Screen: NOT DETECTED

## 2019-11-05 LAB — URINALYSIS, COMPLETE (UACMP) WITH MICROSCOPIC
Bacteria, UA: NONE SEEN
Bilirubin Urine: NEGATIVE
Glucose, UA: NEGATIVE mg/dL
Hgb urine dipstick: NEGATIVE
Ketones, ur: NEGATIVE mg/dL
Leukocytes,Ua: NEGATIVE
Nitrite: NEGATIVE
Protein, ur: NEGATIVE mg/dL
Specific Gravity, Urine: 1.004 — ABNORMAL LOW (ref 1.005–1.030)
Squamous Epithelial / HPF: NONE SEEN (ref 0–5)
WBC, UA: NONE SEEN WBC/hpf (ref 0–5)
pH: 6 (ref 5.0–8.0)

## 2019-11-05 LAB — COMPREHENSIVE METABOLIC PANEL
ALT: 29 U/L (ref 0–44)
AST: 30 U/L (ref 15–41)
Albumin: 4.2 g/dL (ref 3.5–5.0)
Alkaline Phosphatase: 57 U/L (ref 38–126)
Anion gap: 11 (ref 5–15)
BUN: 7 mg/dL (ref 6–20)
CO2: 22 mmol/L (ref 22–32)
Calcium: 8.7 mg/dL — ABNORMAL LOW (ref 8.9–10.3)
Chloride: 111 mmol/L (ref 98–111)
Creatinine, Ser: 0.78 mg/dL (ref 0.44–1.00)
GFR calc Af Amer: >60 mL/min (ref >60)
GFR calc non Af Amer: >60 mL/min (ref >60)
Glucose, Bld: 85 mg/dL (ref 70–99)
Potassium: 3.6 mmol/L (ref 3.5–5.1)
Sodium: 144 mmol/L (ref 135–145)
Total Bilirubin: 0.5 mg/dL (ref 0.3–1.2)
Total Protein: 7.1 g/dL (ref 6.5–8.1)

## 2019-11-05 LAB — ETHANOL: Alcohol, Ethyl (B): 181 mg/dL — ABNORMAL HIGH (ref <10)

## 2019-11-05 LAB — CBC WITH DIFFERENTIAL/PLATELET
Abs Immature Granulocytes: 0.02 10*3/uL (ref 0.00–0.07)
Basophils Absolute: 0.1 10*3/uL (ref 0.0–0.1)
Basophils Relative: 1 %
Eosinophils Absolute: 0.2 10*3/uL (ref 0.0–0.5)
Eosinophils Relative: 3 %
HCT: 40.9 % (ref 36.0–46.0)
Hemoglobin: 13.5 g/dL (ref 12.0–15.0)
Immature Granulocytes: 0 %
Lymphocytes Relative: 41 %
Lymphs Abs: 2.6 10*3/uL (ref 0.7–4.0)
MCH: 33.2 pg (ref 26.0–34.0)
MCHC: 33 g/dL (ref 30.0–36.0)
MCV: 100.5 fL — ABNORMAL HIGH (ref 80.0–100.0)
Monocytes Absolute: 0.4 10*3/uL (ref 0.1–1.0)
Monocytes Relative: 6 %
Neutro Abs: 3.1 10*3/uL (ref 1.7–7.7)
Neutrophils Relative %: 49 %
Platelets: 207 10*3/uL (ref 150–400)
RBC: 4.07 MIL/uL (ref 3.87–5.11)
RDW: 13.2 % (ref 11.5–15.5)
WBC: 6.4 10*3/uL (ref 4.0–10.5)
nRBC: 0 % (ref 0.0–0.2)

## 2019-11-05 LAB — SALICYLATE LEVEL: Salicylate Lvl: <7 mg/dL — ABNORMAL LOW (ref 7.0–30.0)

## 2019-11-05 LAB — TROPONIN I (HIGH SENSITIVITY): Troponin I (High Sensitivity): <2 ng/L (ref <18)

## 2019-11-05 LAB — ACETAMINOPHEN LEVEL: Acetaminophen (Tylenol), Serum: <10 ug/mL — ABNORMAL LOW (ref 10–30)

## 2019-11-05 MED ORDER — ALBUTEROL SULFATE HFA 108 (90 BASE) MCG/ACT IN AERS: 2.0000 | INHALATION_SPRAY | Freq: Four times a day (QID) | RESPIRATORY_TRACT | 0 refills | Status: AC | PRN

## 2019-11-05 NOTE — ED Notes (Signed)
As per provider. Swallow screen completed and second troponin is not needed

## 2019-11-05 NOTE — ED Notes (Signed)
Pt up and ambulating to bathroom with no assistance at this time. Pt noted to be steady on feet. Family at bedside.

## 2019-11-05 NOTE — ED Triage Notes (Signed)
Pt BIB EMS for ETOH and possibly mixed with aderole and clonidine. EMS states pt had a 30 day prescription of aderole and is gone in 2 weeks. Pt states that she takes her medications as prescribed. Pt  Noted to have difficulty with coming up with the words she wants to say.

## 2019-11-05 NOTE — ED Provider Notes (Signed)
[facility] Emergency Department Provider Note   ____________________________________________   First MD Initiated Contact with Patient 11/05/19 0122     (approximate)  I have reviewed the triage vital signs and the nursing notes.   HISTORY  Chief Complaint Alcohol Intoxication    HPI Nicole Macdonald is a 49 y.o. female with past medical history of bipolar disorder and alcohol abuse who presents to the ED for alcohol intoxication.  History is limited due to patient's intoxication and difficulty speaking.  EMS states that they were called to patient's residence for difficulty breathing, upon arrival found patient to appear intoxicated with difficulty speaking.  Patient states that she has "felt bad" for the past 3 days with difficulty getting words out since then.  She denies any focal areas of numbness or weakness, does endorse some congestion and difficulty breathing.  She denies any fevers or chest pain.  EMS was also concerned that patient could have taken extra Adderall or clonidine this evening as empty bottles were found in her house.  Patient tearfully denies this, states "I did not take those medicines."  She does admit to taking her Klonopin earlier tonight but denies taking other medications.        Past Medical History:  Diagnosis Date  . Bipolar 1 disorder (HCC)   . Manic depression (HCC)     Patient Active Problem List   Diagnosis Date Noted  . Suicidal ideation   . Hip fx (HCC) 04/18/2017  . Alcohol abuse 09/06/2014  . Substance induced mood disorder (HCC) 09/06/2014  . Dysthymia 09/06/2014    Past Surgical History:  Procedure Laterality Date  . COMPRESSION HIP SCREW Left 04/19/2017   Procedure: left hip cannulated screw pinning;  Surgeon: Christena Flake, MD;  Location: [facility] ORS;  Service: Orthopedics;  Laterality: Left;  . denies      Prior to Admission medications   Medication Sig Start Date End Date Taking? Authorizing  Provider  albuterol (VENTOLIN HFA) 108 (90 Base) MCG/ACT inhaler Inhale 2 puffs into the lungs every 6 (six) hours as needed for wheezing or shortness of breath. 11/05/19   Chesley Noon, MD  amphetamine-dextroamphetamine (ADDERALL) 20 MG tablet Take 20 mg by mouth daily.    [provider]  calcium-vitamin D (OSCAL 500/200 D-3) 500-200 MG-UNIT tablet Take 1 tablet by mouth 2 (two) times daily. 04/20/17 04/20/18  Adrian Saran, MD  clonazePAM (KLONOPIN) 2 MG tablet Take 2 mg by mouth 4 (four) times daily.     [provider]  docusate sodium (COLACE) 100 MG capsule Take 1 capsule (100 mg total) by mouth 2 (two) times daily. 04/20/17   Adrian Saran, MD  FLUoxetine (PROZAC) 20 MG capsule Take 20 mg by mouth daily.    [provider]  Lidocaine 4 % PTCH Place onto the skin. 04/07/17   [provider]  nicotine (NICODERM CQ - DOSED IN MG/24 HOURS) 21 mg/24hr patch Place 1 patch (21 mg total) onto the skin daily. 04/20/17   Adrian Saran, MD    Allergies Patient has no known allergies.  No family history on file.  Social History Social History   Tobacco Use  . Smoking status: Current Every Day Smoker    Packs/day: 0.50    Types: Cigarettes  . Smokeless tobacco: Never Used  Substance Use Topics  . Alcohol use: Yes  . Drug use: Yes    Types: Cocaine, Marijuana    Review of Systems  Constitutional: No fever/chills Eyes:  No visual changes. ENT: No sore throat.  Positive for congestion. Cardiovascular: Denies chest pain. Respiratory: Positive for shortness of breath. Gastrointestinal: No abdominal pain.  No nausea, no vomiting.  No diarrhea.  No constipation. Genitourinary: Negative for dysuria. Musculoskeletal: Negative for back pain. Skin: Negative for rash. Neurological: Negative for headaches, focal weakness or numbness.  Positive for difficulty speaking.  ____________________________________________   PHYSICAL EXAM:  VITAL SIGNS: ED Triage Vitals    Enc Vitals Group     BP 11/05/19 0120 (!) 128/96     Pulse Rate 11/05/19 0120 84     Resp 11/05/19 0120 13     Temp 11/05/19 0120 98.3 F (36.8 C)     Temp src --      SpO2 11/05/19 0116 97 %     Weight --      Height --      Head Circumference --      Peak Flow --      Pain Score --      Pain Loc --      Pain Edu? --      Excl. in GC? --     Constitutional: Awake and alert, intoxicated appearing. Eyes: Conjunctivae are normal. Head: Atraumatic. Nose: No congestion/rhinnorhea. Mouth/Throat: Mucous membranes are moist. Neck: Normal ROM Cardiovascular: Normal rate, regular rhythm. Grossly normal heart sounds. Respiratory: Normal respiratory effort.  No retractions. Lungs CTAB. Gastrointestinal: Soft and nontender. No distention. Genitourinary: deferred Musculoskeletal: No lower extremity tenderness nor edema. Neurologic: Halting speech pattern with apparent word finding difficulties. No gross focal neurologic deficits are appreciated. Skin:  Skin is warm, dry and intact. No rash noted. Psychiatric: Mood and affect are normal. Speech and behavior are normal.  ____________________________________________   LABS (all labs ordered are listed, but only abnormal results are displayed)  Labs Reviewed  CBC WITH DIFFERENTIAL/PLATELET - Abnormal; Notable for the following components:      Result Value   MCV 100.5 (*)    All other components within normal limits  COMPREHENSIVE METABOLIC PANEL - Abnormal; Notable for the following components:   Calcium 8.7 (*)    All other components within normal limits  ETHANOL - Abnormal; Notable for the following components:   Alcohol, Ethyl (B) 181 (*)    All other components within normal limits  SALICYLATE LEVEL - Abnormal; Notable for the following components:   Salicylate Lvl <7.0 (*)    All other components within normal limits  ACETAMINOPHEN LEVEL - Abnormal; Notable for the following components:   Acetaminophen (Tylenol), Serum  <10 (*)    All other components within normal limits  URINALYSIS, COMPLETE (UACMP) WITH MICROSCOPIC - Abnormal; Notable for the following components:   Color, Urine STRAW (*)    APPearance CLEAR (*)    Specific Gravity, Urine 1.004 (*)    All other components within normal limits  URINE DRUG SCREEN, QUALITATIVE ([facility] ONLY) - Abnormal; Notable for the following components:   Cocaine Metabolite,Ur [place] POSITIVE (*)    All other components within normal limits  TROPONIN I (HIGH SENSITIVITY)   ____________________________________________  EKG  ED ECG REPORT I, Chesley Noon, the attending physician, personally viewed and interpreted this ECG.   Date: 11/05/2019  EKG Time: 1:18  Rate: 97  Rhythm: normal sinus rhythm  Axis: Normal  Intervals:none  ST&T Change: None   PROCEDURES  Procedure(s) performed (including Critical Care):  Procedures   ____________________________________________   INITIAL IMPRESSION / ASSESSMENT AND PLAN / ED COURSE  49 year old female with past medical history of bipolar disorder and alcohol abuse who presents to the ED for speech difficulties that she states have been present for the past 3 days.  She does have an unusual speech pattern with apparent word finding difficulties, but no other focal neurologic deficits noted on exam.  Stroke is on the differential diagnosis, however she is not a candidate for any intervention as she states symptoms have been going on for 3 days.  We will further assess with CT head and labs.  Patient also endorses congestion and some difficulty breathing, but is not in any respiratory distress on exam.  We will check chest x-ray.  It is also possible that intoxication or overdose are playing a role in patient's presentation, ethanol, Tylenol, and salicylate levels are pending.  CT head is negative for acute process and given patient reports symptoms have been ongoing for 3 days, I doubt stroke.  Chest x-ray is negative  for acute process by my read.  She is now much more awake and alert, speaking appropriately and ambulating without difficulty.  She appears clinically sober and back to her baseline, now primarily complaining of upper airway congestion, currently denies difficulty breathing.  We will prescribe albuterol at patient's request, but given she is now clinically sober with no focal neurologic deficits, she is appropriate for discharge home.  It appears that her altered mental status and speech difficulties were related to benzodiazepine as well as alcohol use.  Patient counseled to return to the ED for new worsening symptoms, patient agrees with plan.      ____________________________________________   FINAL CLINICAL IMPRESSION(S) / ED DIAGNOSES  Final diagnoses:  Alcoholic intoxication without complication (HCC)  Chronic prescription benzodiazepine use  Sinus congestion     ED Discharge Orders         Ordered    albuterol (VENTOLIN HFA) 108 (90 Base) MCG/ACT inhaler  Every 6 hours PRN       Note to Pharmacy: Please supply with spacer   11/05/19 0536           Note:  This document was prepared using Dragon voice recognition software and may include unintentional dictation errors.   Chesley Noon, MD 11/05/19 [phone]

## 2019-11-05 NOTE — ED Notes (Signed)
Pt states she just does not feel good. Pt denies taking aderole tonight, but does state she drank alcohol and had some clonidine. Pt on cardiac, bp and pulse ox monitor.

## 2021-05-09 ENCOUNTER — Encounter (HOSPITAL_COMMUNITY): Payer: Self-pay

## 2021-05-09 ENCOUNTER — Inpatient Hospital Stay (HOSPITAL_COMMUNITY)
Admission: EM | Admit: 2021-05-09 | Discharge: 2021-05-11 | DRG: 064 | Disposition: A | Discharge status: Home / Self Care | Payer: Medicare Other | Attending: Internal Medicine | Admitting: Internal Medicine

## 2021-05-09 ENCOUNTER — Emergency Department (HOSPITAL_COMMUNITY): Payer: Medicare Other

## 2021-05-09 DIAGNOSIS — Z720 Tobacco use: Secondary | ICD-10-CM

## 2021-05-09 DIAGNOSIS — I8290 Acute embolism and thrombosis of unspecified vein: Principal | ICD-10-CM

## 2021-05-09 DIAGNOSIS — F10129 Alcohol abuse with intoxication, unspecified: Secondary | ICD-10-CM | POA: Diagnosis present

## 2021-05-09 DIAGNOSIS — F32A Depression, unspecified: Secondary | ICD-10-CM

## 2021-05-09 DIAGNOSIS — I82C12 Acute embolism and thrombosis of left internal jugular vein: Secondary | ICD-10-CM | POA: Diagnosis present

## 2021-05-09 DIAGNOSIS — Z716 Tobacco abuse counseling: Secondary | ICD-10-CM

## 2021-05-09 DIAGNOSIS — F10929 Alcohol use, unspecified with intoxication, unspecified: Secondary | ICD-10-CM

## 2021-05-09 DIAGNOSIS — Z7151 Drug abuse counseling and surveillance of drug abuser: Secondary | ICD-10-CM

## 2021-05-09 DIAGNOSIS — F3130 Bipolar disorder, current episode depressed, mild or moderate severity, unspecified: Secondary | ICD-10-CM | POA: Diagnosis present

## 2021-05-09 DIAGNOSIS — I639 Cerebral infarction, unspecified: Secondary | ICD-10-CM | POA: Diagnosis not present

## 2021-05-09 DIAGNOSIS — R270 Ataxia, unspecified: Secondary | ICD-10-CM | POA: Diagnosis present

## 2021-05-09 DIAGNOSIS — R29708 NIHSS score 8: Secondary | ICD-10-CM | POA: Diagnosis present

## 2021-05-09 DIAGNOSIS — G8194 Hemiplegia, unspecified affecting left nondominant side: Secondary | ICD-10-CM | POA: Diagnosis present

## 2021-05-09 DIAGNOSIS — E785 Hyperlipidemia, unspecified: Secondary | ICD-10-CM | POA: Diagnosis present

## 2021-05-09 DIAGNOSIS — Z79899 Other long term (current) drug therapy: Secondary | ICD-10-CM

## 2021-05-09 DIAGNOSIS — Y907 Blood alcohol level of 200-239 mg/100 ml: Secondary | ICD-10-CM | POA: Diagnosis present

## 2021-05-09 DIAGNOSIS — R29810 Facial weakness: Secondary | ICD-10-CM | POA: Diagnosis present

## 2021-05-09 DIAGNOSIS — I6381 Other cerebral infarction due to occlusion or stenosis of small artery: Principal | ICD-10-CM | POA: Diagnosis present

## 2021-05-09 DIAGNOSIS — Z6828 Body mass index (BMI) 28.0-28.9, adult: Secondary | ICD-10-CM

## 2021-05-09 DIAGNOSIS — G08 Intracranial and intraspinal phlebitis and thrombophlebitis: Secondary | ICD-10-CM | POA: Diagnosis present

## 2021-05-09 DIAGNOSIS — F17210 Nicotine dependence, cigarettes, uncomplicated: Secondary | ICD-10-CM | POA: Diagnosis present

## 2021-05-09 DIAGNOSIS — Z7141 Alcohol abuse counseling and surveillance of alcoholic: Secondary | ICD-10-CM

## 2021-05-09 DIAGNOSIS — R1310 Dysphagia, unspecified: Secondary | ICD-10-CM | POA: Diagnosis present

## 2021-05-09 DIAGNOSIS — Z801 Family history of malignant neoplasm of trachea, bronchus and lung: Secondary | ICD-10-CM

## 2021-05-09 DIAGNOSIS — F14188 Cocaine abuse with other cocaine-induced disorder: Secondary | ICD-10-CM | POA: Diagnosis present

## 2021-05-09 DIAGNOSIS — E663 Overweight: Secondary | ICD-10-CM | POA: Diagnosis present

## 2021-05-09 DIAGNOSIS — E876 Hypokalemia: Secondary | ICD-10-CM | POA: Diagnosis present

## 2021-05-09 DIAGNOSIS — R471 Dysarthria and anarthria: Secondary | ICD-10-CM | POA: Diagnosis present

## 2021-05-09 DIAGNOSIS — F419 Anxiety disorder, unspecified: Secondary | ICD-10-CM | POA: Diagnosis present

## 2021-05-09 DIAGNOSIS — D7589 Other specified diseases of blood and blood-forming organs: Secondary | ICD-10-CM | POA: Diagnosis present

## 2021-05-09 DIAGNOSIS — G459 Transient cerebral ischemic attack, unspecified: Secondary | ICD-10-CM | POA: Diagnosis present

## 2021-05-09 LAB — URINALYSIS, ROUTINE W REFLEX MICROSCOPIC
Bilirubin Urine: NEGATIVE
Glucose, UA: NEGATIVE mg/dL
Hgb urine dipstick: NEGATIVE
Ketones, ur: NEGATIVE mg/dL
Leukocytes,Ua: NEGATIVE
Nitrite: NEGATIVE
Protein, ur: NEGATIVE mg/dL
Specific Gravity, Urine: 1.029 (ref 1.005–1.030)
pH: 5 (ref 5.0–8.0)

## 2021-05-09 LAB — I-STAT CHEM 8, ED
BUN: 7 mg/dL (ref 6–20)
Calcium, Ion: 1.09 mmol/L — ABNORMAL LOW (ref 1.15–1.40)
Chloride: 110 mmol/L (ref 98–111)
Creatinine, Ser: 0.9 mg/dL (ref 0.44–1.00)
Glucose, Bld: 123 mg/dL — ABNORMAL HIGH (ref 70–99)
HCT: 39 % (ref 36.0–46.0)
Hemoglobin: 13.3 g/dL (ref 12.0–15.0)
Potassium: 3.3 mmol/L — ABNORMAL LOW (ref 3.5–5.1)
Sodium: 147 mmol/L — ABNORMAL HIGH (ref 135–145)
TCO2: 25 mmol/L (ref 22–32)

## 2021-05-09 LAB — COMPREHENSIVE METABOLIC PANEL
ALT: 50 U/L — ABNORMAL HIGH (ref 0–44)
AST: 63 U/L — ABNORMAL HIGH (ref 15–41)
Albumin: 3.7 g/dL (ref 3.5–5.0)
Alkaline Phosphatase: 49 U/L (ref 38–126)
Anion gap: 9 (ref 5–15)
BUN: 5 mg/dL — ABNORMAL LOW (ref 6–20)
CO2: 24 mmol/L (ref 22–32)
Calcium: 8.9 mg/dL (ref 8.9–10.3)
Chloride: 112 mmol/L — ABNORMAL HIGH (ref 98–111)
Creatinine, Ser: 0.74 mg/dL (ref 0.44–1.00)
GFR, Estimated: >60 mL/min (ref >60)
Glucose, Bld: 124 mg/dL — ABNORMAL HIGH (ref 70–99)
Potassium: 3.2 mmol/L — ABNORMAL LOW (ref 3.5–5.1)
Sodium: 145 mmol/L (ref 135–145)
Total Bilirubin: 0.2 mg/dL — ABNORMAL LOW (ref 0.3–1.2)
Total Protein: 6.2 g/dL — ABNORMAL LOW (ref 6.5–8.1)

## 2021-05-09 LAB — RAPID URINE DRUG SCREEN, HOSP PERFORMED
Amphetamines: NOT DETECTED
Barbiturates: NOT DETECTED
Benzodiazepines: POSITIVE — AB
Cocaine: POSITIVE — AB
Opiates: NOT DETECTED
Tetrahydrocannabinol: NOT DETECTED

## 2021-05-09 LAB — DIFFERENTIAL
Abs Immature Granulocytes: 0.02 K/uL (ref 0.00–0.07)
Basophils Absolute: 0.1 K/uL (ref 0.0–0.1)
Basophils Relative: 1 %
Eosinophils Absolute: 0.1 K/uL (ref 0.0–0.5)
Eosinophils Relative: 1 %
Immature Granulocytes: 0 %
Lymphocytes Relative: 38 %
Lymphs Abs: 2.5 K/uL (ref 0.7–4.0)
Monocytes Absolute: 0.5 K/uL (ref 0.1–1.0)
Monocytes Relative: 7 %
Neutro Abs: 3.5 K/uL (ref 1.7–7.7)
Neutrophils Relative %: 53 %

## 2021-05-09 LAB — APTT: aPTT: 23 s — ABNORMAL LOW (ref 24–36)

## 2021-05-09 LAB — PROTIME-INR
INR: 0.8 (ref 0.8–1.2)
Prothrombin Time: 11.1 seconds — ABNORMAL LOW (ref 11.4–15.2)

## 2021-05-09 LAB — CBC
HCT: 38.3 % (ref 36.0–46.0)
Hemoglobin: 12.5 g/dL (ref 12.0–15.0)
MCH: 34.7 pg — ABNORMAL HIGH (ref 26.0–34.0)
MCHC: 32.6 g/dL (ref 30.0–36.0)
MCV: 106.4 fL — ABNORMAL HIGH (ref 80.0–100.0)
Platelets: 211 10*3/uL (ref 150–400)
RBC: 3.6 MIL/uL — ABNORMAL LOW (ref 3.87–5.11)
RDW: 13.3 % (ref 11.5–15.5)
WBC: 6.6 10*3/uL (ref 4.0–10.5)
nRBC: 0 % (ref 0.0–0.2)

## 2021-05-09 LAB — ETHANOL: Alcohol, Ethyl (B): 239 mg/dL — ABNORMAL HIGH (ref <10)

## 2021-05-09 MED ORDER — SODIUM CHLORIDE 0.9% FLUSH
3.0000 mL | Freq: Once | INTRAVENOUS | Status: AC
  Administered 2021-05-09: 3 mL via INTRAVENOUS

## 2021-05-09 MED ORDER — IOHEXOL 350 MG/ML SOLN
70.0000 mL | Freq: Once | INTRAVENOUS | Status: AC | PRN
  Administered 2021-05-09: 70 mL via INTRAVENOUS

## 2021-05-09 MED ORDER — IOHEXOL 350 MG/ML SOLN
75.0000 mL | Freq: Once | INTRAVENOUS | Status: AC | PRN
  Administered 2021-05-09: 75 mL via INTRAVENOUS

## 2021-05-09 MED ORDER — IOHEXOL 350 MG/ML SOLN
100.0000 mL | Freq: Once | INTRAVENOUS | Status: AC | PRN
  Administered 2021-05-09: 40 mL via INTRAVENOUS

## 2021-05-09 MED ORDER — LORAZEPAM 2 MG/ML IJ SOLN
INTRAMUSCULAR | Status: AC
  Filled 2021-05-09: qty 1

## 2021-05-09 MED ORDER — LORAZEPAM 2 MG/ML IJ SOLN: 2.0000 mg | Freq: Once | INTRAMUSCULAR | Status: DC | PRN

## 2021-05-09 MED ORDER — LORAZEPAM 2 MG/ML IJ SOLN
2.0000 mg | Freq: Once | INTRAMUSCULAR | Status: AC
  Administered 2021-05-09: 2 mg via INTRAVENOUS

## 2021-05-09 NOTE — Consult Note (Signed)
?                    NEURO HOSPITALIST CONSULT NOTE  ? ?Requestig physician: Dr. Doren Custard ? ?Reason for Consult: Acute onset of left sided weakness and garbled speech ? ?History obtained from:  Patient, EMS and Chart    ? ?HPI:                                                                                                                                         ? Nicole Macdonald is an 51 y.o. female with a PMHx of bipolar disorder and manic depression, presenting via EMS as a Code Stroke for new onset of left sided weakness and garbled speech. On arrival to the ED, the patient is with mixed sedated and agitated affect, exhibits slurred speech, tremulousness, poor attention and left sided weakness as well as alternating apparent weakness of the lower quadrants of her face. She relates a story, pieced together after repeated questioning, of her working at a [employer] on either Sunday, Monday or Tuesday, at which time she allowed a homeless man to occupy the [place]. She states that at some point she loses clear memories of the events but noted that her drink and cigarettes were outside of the laundromat, the man had disappeared, that she felt ill and was disoriented. She made it back home at some point and was out sick the next day or two, versus having one or two days off. Last night she states that she may have been drinking. Today, she was apparently incoherent in her home and EMS was called. When they found her, she was weak, incoordinated and with slurred speech and disorientation. Her mother was with her and apparently was too intoxicated to contribute useful history.  ? ?Past Medical History:  ?Diagnosis Date  ? Bipolar 1 disorder ([place])   ? Manic depression ([place])   ? ? ?Past Surgical History:  ?Procedure Laterality Date  ? COMPRESSION HIP SCREW Left 04/19/2017  ? Procedure: left hip cannulated screw pinning;  Surgeon: Corky Mull, MD;  Location: [facility] ORS;  Service: Orthopedics;  Laterality: Left;  ?  denies    ? ? ?History reviewed. No pertinent family history.          ? ?Social History:  reports that she has been smoking cigarettes. She has been smoking an average of .5 packs per day. She has never used smokeless tobacco. She reports current alcohol use. She reports current drug use. Drugs: Cocaine and Marijuana. ? ?No Known Allergies ? ?HOME MEDICATIONS:                                                                                                                     ? ?  No current facility-administered medications on file prior to encounter.  ? ?Current Outpatient Medications on File Prior to Encounter  ?Medication Sig Dispense Refill  ? albuterol (VENTOLIN HFA) 108 (90 Base) MCG/ACT inhaler Inhale 2 puffs into the lungs every 6 (six) hours as needed for wheezing or shortness of breath. 8 g 0  ? amphetamine-dextroamphetamine (ADDERALL) 20 MG tablet Take 20 mg by mouth daily.    ? calcium-vitamin D (OSCAL 500/200 D-3) 500-200 MG-UNIT tablet Take 1 tablet by mouth 2 (two) times daily. 180 tablet 3  ? clonazePAM (KLONOPIN) 2 MG tablet Take 2 mg by mouth 4 (four) times daily.     ? docusate sodium (COLACE) 100 MG capsule Take 1 capsule (100 mg total) by mouth 2 (two) times daily. 10 capsule 0  ? FLUoxetine (PROZAC) 20 MG capsule Take 20 mg by mouth daily.    ? Lidocaine 4 % PTCH Place onto the skin.    ? nicotine (NICODERM CQ - DOSED IN MG/24 HOURS) 21 mg/24hr patch Place 1 patch (21 mg total) onto the skin daily. 28 patch 0  ? ? ? ?ROS:                                                                                                                                       ?Unable to obtain a clear and concise ROS due to agitation, dysarthria, and poor sentence comprehension.  ? ? ?Blood pressure 113/73, pulse 96, resp. rate (!) 21, height 5\' 3"  (1.6 m), weight 72 kg, SpO2 98 %. ? ? ?General Examination:                                                                                                      ? ?Physical  Exam  ?HEENT-  [place]/AT o  ?Lungs- Respirations unlabored ?Extremities- No edema ? ? ?Neurological Examination ?Mental Status: Awake, agitated, with decreased level of alertness and poor attention. Speech is dysarthric with an ataxic quality. Speech is initially only with single words and short phrases that are garbled, tangential and at times nonsensical. Disoriented as well. During the evaluation speech improves gradually, becoming more coherent with more consistent replies to questions and commands. About one hour after arrival she was able to speak in short coherent sentences but was still with mixed sedated and anxious affect as well as poor attention.  ?Cranial Nerves: ?II: Apparent constriction of the peripheral visual fields in all 4 quadrants of each eye with preserved central vision. PERRL.   ?  III,IV, VI: No ptosis. EOMI.  ?V: Temp sensation decreased on the left.  ?VII: Alternating deficits in muscle contraction on the left and right suggestive of possible dyskinesia versus embellishment ?VIII: Hearing intact to questions and commands.  ?IX,X: Mild vocal hoarseness noted.  ?XI: No definite asymmetry ?XII: Midline tongue extension ?Motor: ?RUE and RLE 4+/5 to 5/5 ?LUE with uncoordinated movements and 3 to 4-/5 strength proximally and distally ?LLE with uncoordinated movements and 3 to 4-/5 strength proximally and distally ?Wavering drift of LUE and LLE when held antigravity ?Sensory: Decreased temp sensation to LLE and RUE. FT intact x 4.  ?Deep Tendon Reflexes: 2+ and symmetric throughout ?Plantars: Right: downgoing   Left: downgoing ?Cerebellar: Ataxic/dyssinergic LUE and LLE movements but not disproportionate to the level of weakness.  ?Gait: Unable to assess due to falls risk concerns.  ? ?NIHSS: 8 ?  ?Lab Results: ?Basic Metabolic Panel: ?Recent Labs  ?Lab 05/09/21 ?1546 05/09/21 ?1551  ?NA 145 147*  ?K 3.2* 3.3*  ?CL 112* 110  ?CO2 24  --   ?GLUCOSE 124* 123*  ?BUN 5* 7  ?CREATININE 0.74 0.90  ?CALCIUM  8.9  --   ? ? ?CBC: ?Recent Labs  ?Lab 05/09/21 ?1546 05/09/21 ?1551  ?WBC 6.6  --   ?NEUTROABS 3.5  --   ?HGB 12.5 13.3  ?HCT 38.3 39.0  ?MCV 106.4*  --   ?PLT 211  --   ? ? ?Cardiac Enzymes: ?No results for input(s): CKTOTAL, CKMB, CKMBINDEX, TROPONINI in the last 168 hours. ? ?Lipid Panel: ?No results for input(s): CHOL, TRIG, HDL, CHOLHDL, VLDL, LDLCALC in the last 168 hours. ? ?Imaging: ?CT CEREBRAL PERFUSION W CONTRAST ? ?Result Date: 05/09/2021 ?CLINICAL DATA:  Neuro deficit, acute, stroke suspected EXAM: CT ANGIOGRAPHY HEAD AND NECK CT PERFUSION BRAIN TECHNIQUE: Multidetector CT imaging of the head and neck was performed using the standard protocol during bolus administration of intravenous contrast. Multiplanar CT image reconstructions and MIPs were obtained to evaluate the vascular anatomy. Carotid stenosis measurements (when applicable) are obtained utilizing NASCET criteria, using the distal internal carotid diameter as the denominator. Multiphase CT imaging of the brain was performed following IV bolus contrast injection. Subsequent parametric perfusion maps were calculated using RAPID software. RADIATION DOSE REDUCTION: This exam was performed according to the departmental dose-optimization program which includes automated exposure control, adjustment of the mA and/or kV according to patient size and/or use of iterative reconstruction technique. CONTRAST:  43mL OMNIPAQUE IOHEXOL 350 MG/ML SOLN COMPARISON:  CT head 05/09/2021 and 11/05/2019, no prior CTA. FINDINGS: CT HEAD FINDINGS For noncontrast findings, please see same day CT head. CTA NECK FINDINGS Aortic arch: 4 vessel arch, with the origin of the left vertebral artery from the aorta. Imaged portion shows no evidence of aneurysm or dissection. No significant stenosis of the major arch vessel origins. Right carotid system: No evidence of dissection, stenosis (50% or greater) or occlusion. Left carotid system: No evidence of dissection, stenosis (50%  or greater) or occlusion. Vertebral arteries: Calcifications at the left vertebral artery origin, from the aorta, which do not appear hemodynamically significant. No evidence of dissection, occlusion, or

## 2021-05-09 NOTE — ED Notes (Signed)
Patient transported to CT 

## 2021-05-09 NOTE — Progress Notes (Signed)
Patient was moving around too much in order to obtain all imaging that was ordered.  Legs in air and shifting around on table.  Limited imaging of MRI Brain w/o was completed.  No MRV brain or contrast could be done.    ?

## 2021-05-09 NOTE — ED Notes (Signed)
Entered room to assess pt, pt not in room. Pt in MRI at this time. Was unaware pt had left unit  ?

## 2021-05-09 NOTE — ED Provider Notes (Signed)
?[facility] EMERGENCY DEPARTMENT ?Provider Note ? ? ?CSN: [id number] ?Arrival date & time: 05/09/21  1530 ? ?An emergency department physician performed an initial assessment on this suspected stroke patient at 1530. ? ?History ? ?Chief Complaint  ?Patient presents with  ? Code Stroke  ? ? ?Nicole Macdonald is a 51 y.o. female. ? ?HPI ?Patient presents for left-sided weakness.  Medical history includes alcohol abuse, bipolar disorder, depression.  History is limited due to the patient's distal dress and what appears to be an intoxicated state.  EMS believes that her daughter called 911.  Patient states that she went to bed last night with pain but is unable to describe the pain.  She states that she woke up today with left-sided weakness. ? ?History per ex-husband: Patient is accessed and spoke to her on the phone at 9 AM.  At that time, she seemed to be in her normal state of health.  She did not have any physical complaints that she expressed to him at that time.  She subsequently talked to him again at 11 AM and at that time, patient endorsed feeling unwell. ?  ? ?Home Medications ?Prior to Admission medications   ?Medication Sig Start Date End Date Taking? Authorizing Provider  ?albuterol (VENTOLIN HFA) 108 (90 Base) MCG/ACT inhaler Inhale 2 puffs into the lungs every 6 (six) hours as needed for wheezing or shortness of breath. 11/05/19   Chesley Noon, MD  ?amphetamine-dextroamphetamine (ADDERALL) 20 MG tablet Take 20 mg by mouth daily.    [provider]  ?calcium-vitamin D (OSCAL 500/200 D-3) 500-200 MG-UNIT tablet Take 1 tablet by mouth 2 (two) times daily. 04/20/17 04/20/18  Adrian Saran, MD  ?clonazePAM (KLONOPIN) 2 MG tablet Take 2 mg by mouth 4 (four) times daily.     [provider]  ?docusate sodium (COLACE) 100 MG capsule Take 1 capsule (100 mg total) by mouth 2 (two) times daily. 04/20/17   Adrian Saran, MD  ?FLUoxetine (PROZAC) 20 MG capsule Take 20 mg by mouth  daily.    [provider]  ?Lidocaine 4 % PTCH Place onto the skin. 04/07/17   [provider]  ?nicotine (NICODERM CQ - DOSED IN MG/24 HOURS) 21 mg/24hr patch Place 1 patch (21 mg total) onto the skin daily. 04/20/17   Adrian Saran, MD  ?   ? ?Allergies    ?Patient has no known allergies.   ? ?Review of Systems   ?Review of Systems  ?Unable to perform ROS: Mental status change  ? ?Physical Exam ?Updated Vital Signs ?BP 117/74   Pulse 66   Temp 98.3 ?F (36.8 ?C) (Oral)   Resp 17   Ht 5\' 3"  (1.6 m)   Wt 72 kg   SpO2 100%   BMI 28.12 kg/m?  ?Physical Exam ?Vitals and nursing note reviewed.  ?Constitutional:   ?   General: She is not in acute distress. ?   Appearance: She is well-developed and normal weight. She is ill-appearing. She is not toxic-appearing or diaphoretic.  ?HENT:  ?   Head: Normocephalic and atraumatic.  ?   Right Ear: External ear normal.  ?   Left Ear: External ear normal.  ?   Nose: Nose normal.  ?   Mouth/Throat:  ?   Mouth: Mucous membranes are moist.  ?   Pharynx: Oropharynx is clear.  ?Eyes:  ?   Extraocular Movements: Extraocular movements intact.  ?   Conjunctiva/sclera: Conjunctivae normal.  ?Pulmonary:  ?  Effort: Pulmonary effort is normal. No respiratory distress.  ?Abdominal:  ?   General: Abdomen is flat. There is no distension.  ?Musculoskeletal:     ?   General: No swelling or deformity.  ?   Cervical back: Normal range of motion and neck supple.  ?Skin: ?   General: Skin is warm and dry.  ?   Coloration: Skin is not jaundiced or pale.  ?Neurological:  ?   Mental Status: She is alert.  ?   Cranial Nerves: Dysarthria and facial asymmetry present.  ?   Motor: Weakness (Left hemibody) present.  ?Psychiatric:     ?   Mood and Affect: Mood normal.  ? ? ?ED Results / Procedures / Treatments   ?Labs ?(all labs ordered are listed, but only abnormal results are displayed) ?Labs Reviewed  ?PROTIME-INR - Abnormal; Notable for the following components:  ?    Result Value  ?  Prothrombin Time 11.1 (*)   ? All other components within normal limits  ?APTT - Abnormal; Notable for the following components:  ? aPTT 23 (*)   ? All other components within normal limits  ?CBC - Abnormal; Notable for the following components:  ? RBC 3.60 (*)   ? MCV 106.4 (*)   ? MCH 34.7 (*)   ? All other components within normal limits  ?COMPREHENSIVE METABOLIC PANEL - Abnormal; Notable for the following components:  ? Potassium 3.2 (*)   ? Chloride 112 (*)   ? Glucose, Bld 124 (*)   ? BUN 5 (*)   ? Total Protein 6.2 (*)   ? AST 63 (*)   ? ALT 50 (*)   ? Total Bilirubin 0.2 (*)   ? All other components within normal limits  ?RAPID URINE DRUG SCREEN, HOSP PERFORMED - Abnormal; Notable for the following components:  ? Cocaine POSITIVE (*)   ? Benzodiazepines POSITIVE (*)   ? All other components within normal limits  ?ETHANOL - Abnormal; Notable for the following components:  ? Alcohol, Ethyl (B) 239 (*)   ? All other components within normal limits  ?I-STAT CHEM 8, ED - Abnormal; Notable for the following components:  ? Sodium 147 (*)   ? Potassium 3.3 (*)   ? Glucose, Bld 123 (*)   ? Calcium, Ion 1.09 (*)   ? All other components within normal limits  ?DIFFERENTIAL  ?URINALYSIS, ROUTINE W REFLEX MICROSCOPIC  ?HEPARIN LEVEL (UNFRACTIONATED)  ?HIV ANTIBODY (ROUTINE TESTING W REFLEX)  ?HEMOGLOBIN A1C  ?LIPID PANEL  ?CBG MONITORING, ED  ? ? ?EKG ?EKG Interpretation ? ?Date/Time:  Wednesday May 09 2021 16:14:50 EDT ?Ventricular Rate:  91 ?PR Interval:  177 ?QRS Duration: 85 ?QT Interval:  367 ?QTC Calculation: 452 ?R Axis:   90 ?Text Interpretation: Sinus rhythm Anteroseptal infarct, age indeterminate Confirmed by Gloris Manchester [phone]) on 05/09/2021 4:19:48 PM ? ?Radiology ?MR BRAIN WO CONTRAST ? ?Result Date: 05/09/2021 ?CLINICAL DATA:  Neuro deficit, acute, stroke suspected. Left-sided weakness, slurred speech, and facial droop. EXAM: MRI HEAD WITHOUT CONTRAST TECHNIQUE: Multiplanar, multiecho pulse sequences of the  brain and surrounding structures were obtained without intravenous contrast. COMPARISON:  Head CT, CTA, and CTP 05/09/2021 FINDINGS: The examination had to be discontinued prior to completion due to the patient's condition and movement. A complete noncontrast brain MRI was obtained with exception of coronal T2 imaging. No IV contrast was administered, and the requested MRV was not obtained. Brain: There is no evidence of an acute infarct, intracranial hemorrhage, mass, midline  shift, or extra-axial fluid collection. The ventricles and sulci are within normal limits for age. No age advanced white matter disease is evident. Vascular: Absent flow void in the left sigmoid sinus. Skull and upper cervical spine: Unremarkable bone marrow signal. Sinuses/Orbits: Unremarkable orbits. Minimal mucosal thick scar asset mild scattered mucosal thickening in the paranasal sinuses. No significant mastoid fluid. Other: None. IMPRESSION: 1. Incomplete, motion degraded examination. 2.  No acute infarct. 3. Abnormal appearance of the left sigmoid sinus corresponding to suspected thrombosis on today's CTA. Electronically Signed   By: Sebastian AcheAllen  Grady M.D.   On: 05/09/2021 18:35  ? ?CT CEREBRAL PERFUSION W CONTRAST ? ?Result Date: 05/09/2021 ?CLINICAL DATA:  Neuro deficit, acute, stroke suspected EXAM: CT ANGIOGRAPHY HEAD AND NECK CT PERFUSION BRAIN TECHNIQUE: Multidetector CT imaging of the head and neck was performed using the standard protocol during bolus administration of intravenous contrast. Multiplanar CT image reconstructions and MIPs were obtained to evaluate the vascular anatomy. Carotid stenosis measurements (when applicable) are obtained utilizing NASCET criteria, using the distal internal carotid diameter as the denominator. Multiphase CT imaging of the brain was performed following IV bolus contrast injection. Subsequent parametric perfusion maps were calculated using RAPID software. RADIATION DOSE REDUCTION: This exam was  performed according to the departmental dose-optimization program which includes automated exposure control, adjustment of the mA and/or kV according to patient size and/or use of iterative reconstruction technique. CONTR
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
## 2021-05-09 NOTE — ED Notes (Signed)
Pt alert and oriented, reporting tingling and numbness to left side of body with reduced sensitivity to touch. Weakness in left upper and lower extremity noted as well as asymmetrical smile. Slight speech slurring noted. Pt passed stroke swallow screen without difficulty. VSS.  ?

## 2021-05-09 NOTE — ED Notes (Signed)
Daughter Levonne Lapping [phone] would like an update asap ?

## 2021-05-09 NOTE — ED Provider Notes (Signed)
11:18 PM ?Assumed care from Dr. Durwin Nora, please see their note for full history, physical and decision making until this point. In brief this is a 51 y.o. year old female who presented to the ED tonight with Code Stroke ?    ?Getting scans for venous sinus thrombosis. Needs admit for same or seizure workup based on results. Will need CIWA at some point.  ? ?CT positive for CVT. Hpearin initiated. CIWA initiated. Will admit.  ? ?CRITICAL CARE ?Performed by: Marily Memos ?Total critical care time: 35 minutes ?Critical care time was exclusive of separately billable procedures and treating other patients. ?Critical care was necessary to treat or prevent imminent or life-threatening deterioration. ?Critical care was time spent personally by me on the following activities: development of treatment plan with patient and/or surrogate as well as nursing, discussions with consultants, evaluation of patient's response to treatment, examination of patient, obtaining history from patient or surrogate, ordering and performing treatments and interventions, ordering and review of laboratory studies, ordering and review of radiographic studies, pulse oximetry and re-evaluation of patient's condition. ? ? ?Labs, studies and imaging reviewed by myself and considered in medical decision making if ordered. Imaging interpreted by radiology. ? ?Labs Reviewed  ?PROTIME-INR - Abnormal; Notable for the following components:  ?    Result Value  ? Prothrombin Time 11.1 (*)   ? All other components within normal limits  ?APTT - Abnormal; Notable for the following components:  ? aPTT 23 (*)   ? All other components within normal limits  ?CBC - Abnormal; Notable for the following components:  ? RBC 3.60 (*)   ? MCV 106.4 (*)   ? MCH 34.7 (*)   ? All other components within normal limits  ?COMPREHENSIVE METABOLIC PANEL - Abnormal; Notable for the following components:  ? Potassium 3.2 (*)   ? Chloride 112 (*)   ? Glucose, Bld 124 (*)   ? BUN 5 (*)    ? Total Protein 6.2 (*)   ? AST 63 (*)   ? ALT 50 (*)   ? Total Bilirubin 0.2 (*)   ? All other components within normal limits  ?RAPID URINE DRUG SCREEN, HOSP PERFORMED - Abnormal; Notable for the following components:  ? Cocaine POSITIVE (*)   ? Benzodiazepines POSITIVE (*)   ? All other components within normal limits  ?ETHANOL - Abnormal; Notable for the following components:  ? Alcohol, Ethyl (B) 239 (*)   ? All other components within normal limits  ?I-STAT CHEM 8, ED - Abnormal; Notable for the following components:  ? Sodium 147 (*)   ? Potassium 3.3 (*)   ? Glucose, Bld 123 (*)   ? Calcium, Ion 1.09 (*)   ? All other components within normal limits  ?DIFFERENTIAL  ?URINALYSIS, ROUTINE W REFLEX MICROSCOPIC  ?CBG MONITORING, ED  ? ? ?MR BRAIN WO CONTRAST  ?Final Result  ?  ?CT CEREBRAL PERFUSION W CONTRAST  ?Final Result  ?  ?CT ANGIO HEAD NECK W WO CM (CODE STROKE)  ?Final Result  ?  ?CT HEAD CODE STROKE WO CONTRAST  ?Final Result  ?  ?CT VENOGRAM HEAD    (Results Pending)  ? ? ?No follow-ups on file. ? ?  ?Marily Memos, MD ?05/10/21 0017 ? ?

## 2021-05-09 NOTE — ED Triage Notes (Addendum)
Pt BIB [facility] for eval of neurological complaint. Daughter spoke to her mother over the phone this afternoon and noted that she "wasn't herself". Daughter activated 911. Pt w/ L sided facial droop, L sided weakness, slurred speech and some ataxia. Also L sided facial twitching noted on arrival to ED. ETOH on board. Activated as code stroke at the bridge by MD Dixon. NO known well, as pt and family member that was with her has dementia and also ETOH ?

## 2021-05-10 ENCOUNTER — Inpatient Hospital Stay (HOSPITAL_COMMUNITY): Payer: Medicare Other

## 2021-05-10 ENCOUNTER — Other Ambulatory Visit: Payer: Self-pay

## 2021-05-10 DIAGNOSIS — Z716 Tobacco abuse counseling: Secondary | ICD-10-CM | POA: Diagnosis not present

## 2021-05-10 DIAGNOSIS — G08 Intracranial and intraspinal phlebitis and thrombophlebitis: Secondary | ICD-10-CM | POA: Diagnosis present

## 2021-05-10 DIAGNOSIS — F419 Anxiety disorder, unspecified: Secondary | ICD-10-CM | POA: Diagnosis present

## 2021-05-10 DIAGNOSIS — E876 Hypokalemia: Secondary | ICD-10-CM | POA: Diagnosis present

## 2021-05-10 DIAGNOSIS — I6381 Other cerebral infarction due to occlusion or stenosis of small artery: Secondary | ICD-10-CM | POA: Diagnosis present

## 2021-05-10 DIAGNOSIS — F17210 Nicotine dependence, cigarettes, uncomplicated: Secondary | ICD-10-CM | POA: Diagnosis present

## 2021-05-10 DIAGNOSIS — I6389 Other cerebral infarction: Secondary | ICD-10-CM | POA: Diagnosis not present

## 2021-05-10 DIAGNOSIS — Z720 Tobacco use: Secondary | ICD-10-CM

## 2021-05-10 DIAGNOSIS — R471 Dysarthria and anarthria: Secondary | ICD-10-CM | POA: Diagnosis present

## 2021-05-10 DIAGNOSIS — R569 Unspecified convulsions: Secondary | ICD-10-CM

## 2021-05-10 DIAGNOSIS — R1310 Dysphagia, unspecified: Secondary | ICD-10-CM | POA: Diagnosis present

## 2021-05-10 DIAGNOSIS — D7589 Other specified diseases of blood and blood-forming organs: Secondary | ICD-10-CM | POA: Diagnosis present

## 2021-05-10 DIAGNOSIS — I82C12 Acute embolism and thrombosis of left internal jugular vein: Secondary | ICD-10-CM | POA: Diagnosis present

## 2021-05-10 DIAGNOSIS — Z6828 Body mass index (BMI) 28.0-28.9, adult: Secondary | ICD-10-CM | POA: Diagnosis not present

## 2021-05-10 DIAGNOSIS — E663 Overweight: Secondary | ICD-10-CM | POA: Diagnosis present

## 2021-05-10 DIAGNOSIS — F10129 Alcohol abuse with intoxication, unspecified: Secondary | ICD-10-CM | POA: Diagnosis present

## 2021-05-10 DIAGNOSIS — R29708 NIHSS score 8: Secondary | ICD-10-CM | POA: Diagnosis present

## 2021-05-10 DIAGNOSIS — Z801 Family history of malignant neoplasm of trachea, bronchus and lung: Secondary | ICD-10-CM | POA: Diagnosis not present

## 2021-05-10 DIAGNOSIS — R29810 Facial weakness: Secondary | ICD-10-CM | POA: Diagnosis present

## 2021-05-10 DIAGNOSIS — G459 Transient cerebral ischemic attack, unspecified: Secondary | ICD-10-CM | POA: Diagnosis present

## 2021-05-10 DIAGNOSIS — Z7141 Alcohol abuse counseling and surveillance of alcoholic: Secondary | ICD-10-CM | POA: Diagnosis not present

## 2021-05-10 DIAGNOSIS — E785 Hyperlipidemia, unspecified: Secondary | ICD-10-CM | POA: Diagnosis present

## 2021-05-10 DIAGNOSIS — I8290 Acute embolism and thrombosis of unspecified vein: Secondary | ICD-10-CM | POA: Diagnosis present

## 2021-05-10 DIAGNOSIS — Z79899 Other long term (current) drug therapy: Secondary | ICD-10-CM | POA: Diagnosis not present

## 2021-05-10 DIAGNOSIS — R270 Ataxia, unspecified: Secondary | ICD-10-CM | POA: Diagnosis present

## 2021-05-10 DIAGNOSIS — F32A Depression, unspecified: Secondary | ICD-10-CM

## 2021-05-10 DIAGNOSIS — F14188 Cocaine abuse with other cocaine-induced disorder: Secondary | ICD-10-CM | POA: Diagnosis present

## 2021-05-10 DIAGNOSIS — F3130 Bipolar disorder, current episode depressed, mild or moderate severity, unspecified: Secondary | ICD-10-CM | POA: Diagnosis present

## 2021-05-10 DIAGNOSIS — G8194 Hemiplegia, unspecified affecting left nondominant side: Secondary | ICD-10-CM | POA: Diagnosis present

## 2021-05-10 DIAGNOSIS — F10920 Alcohol use, unspecified with intoxication, uncomplicated: Secondary | ICD-10-CM

## 2021-05-10 DIAGNOSIS — F10929 Alcohol use, unspecified with intoxication, unspecified: Secondary | ICD-10-CM

## 2021-05-10 DIAGNOSIS — Y907 Blood alcohol level of 200-239 mg/100 ml: Secondary | ICD-10-CM | POA: Diagnosis present

## 2021-05-10 LAB — MAGNESIUM: Magnesium: 2 mg/dL (ref 1.7–2.4)

## 2021-05-10 LAB — BRAIN NATRIURETIC PEPTIDE: B Natriuretic Peptide: 31.8 pg/mL (ref 0.0–100.0)

## 2021-05-10 LAB — ECHOCARDIOGRAM COMPLETE
Area-P 1/2: 3.72 cm2
Calc EF: 54.4 %
Height: 63 in
S' Lateral: 2.9 cm
Single Plane A2C EF: 59.9 %
Single Plane A4C EF: 52 %
Weight: 2539.7 oz

## 2021-05-10 LAB — CBC
HCT: 34.6 % — ABNORMAL LOW (ref 36.0–46.0)
Hemoglobin: 11.2 g/dL — ABNORMAL LOW (ref 12.0–15.0)
MCH: 33.7 pg (ref 26.0–34.0)
MCHC: 32.4 g/dL (ref 30.0–36.0)
MCV: 104.2 fL — ABNORMAL HIGH (ref 80.0–100.0)
Platelets: 187 10*3/uL (ref 150–400)
RBC: 3.32 MIL/uL — ABNORMAL LOW (ref 3.87–5.11)
RDW: 13.5 % (ref 11.5–15.5)
WBC: 5.5 10*3/uL (ref 4.0–10.5)
nRBC: 0 % (ref 0.0–0.2)

## 2021-05-10 LAB — COMPREHENSIVE METABOLIC PANEL
ALT: 43 U/L (ref 0–44)
AST: 46 U/L — ABNORMAL HIGH (ref 15–41)
Albumin: 3.3 g/dL — ABNORMAL LOW (ref 3.5–5.0)
Alkaline Phosphatase: 45 U/L (ref 38–126)
Anion gap: 5 (ref 5–15)
BUN: 6 mg/dL (ref 6–20)
CO2: 25 mmol/L (ref 22–32)
Calcium: 8.3 mg/dL — ABNORMAL LOW (ref 8.9–10.3)
Chloride: 109 mmol/L (ref 98–111)
Creatinine, Ser: 0.58 mg/dL (ref 0.44–1.00)
GFR, Estimated: >60 mL/min (ref >60)
Glucose, Bld: 95 mg/dL (ref 70–99)
Potassium: 3.4 mmol/L — ABNORMAL LOW (ref 3.5–5.1)
Sodium: 139 mmol/L (ref 135–145)
Total Bilirubin: 0.4 mg/dL (ref 0.3–1.2)
Total Protein: 5.6 g/dL — ABNORMAL LOW (ref 6.5–8.1)

## 2021-05-10 LAB — LIPID PANEL
Cholesterol: 174 mg/dL (ref 0–200)
HDL: 66 mg/dL (ref >40)
LDL Cholesterol: 98 mg/dL (ref 0–99)
Total CHOL/HDL Ratio: 2.6 RATIO
Triglycerides: 51 mg/dL (ref <150)
VLDL: 10 mg/dL (ref 0–40)

## 2021-05-10 LAB — ANTITHROMBIN III: AntiThromb III Func: 82 % (ref 75–120)

## 2021-05-10 LAB — HEMOGLOBIN A1C
Hgb A1c MFr Bld: 4.9 % (ref 4.8–5.6)
Mean Plasma Glucose: 93.93 mg/dL

## 2021-05-10 LAB — HEPARIN LEVEL (UNFRACTIONATED): Heparin Unfractionated: 0.15 IU/mL — ABNORMAL LOW (ref 0.30–0.70)

## 2021-05-10 LAB — C-REACTIVE PROTEIN: CRP: 0.6 mg/dL (ref <1.0)

## 2021-05-10 LAB — HIV ANTIBODY (ROUTINE TESTING W REFLEX): HIV Screen 4th Generation wRfx: NONREACTIVE

## 2021-05-10 LAB — CBG MONITORING, ED: Glucose-Capillary: 121 mg/dL — ABNORMAL HIGH (ref 70–99)

## 2021-05-10 MED ORDER — DOCUSATE SODIUM 100 MG PO CAPS
100.0000 mg | ORAL_CAPSULE | Freq: Two times a day (BID) | ORAL | Status: DC
  Administered 2021-05-10 – 2021-05-11 (×3): 100 mg via ORAL
  Filled 2021-05-10 (×3): qty 1

## 2021-05-10 MED ORDER — LORAZEPAM 2 MG/ML IJ SOLN: 1.0000 mg | INTRAMUSCULAR | Status: DC | PRN

## 2021-05-10 MED ORDER — APIXABAN 5 MG PO TABS
5.0000 mg | ORAL_TABLET | Freq: Two times a day (BID) | ORAL | Status: DC
  Administered 2021-05-10 – 2021-05-11 (×2): 5 mg via ORAL
  Filled 2021-05-10 (×2): qty 1

## 2021-05-10 MED ORDER — THIAMINE HCL 100 MG/ML IJ SOLN: 100.0000 mg | Freq: Every day | INTRAMUSCULAR | Status: DC

## 2021-05-10 MED ORDER — CLONAZEPAM 0.5 MG PO TABS: 2.0000 mg | ORAL_TABLET | Freq: Three times a day (TID) | ORAL | Status: DC | PRN

## 2021-05-10 MED ORDER — SENNOSIDES-DOCUSATE SODIUM 8.6-50 MG PO TABS: 1.0000 | ORAL_TABLET | Freq: Every evening | ORAL | Status: DC | PRN

## 2021-05-10 MED ORDER — THIAMINE HCL 100 MG PO TABS
100.0000 mg | ORAL_TABLET | Freq: Every day | ORAL | Status: DC
  Administered 2021-05-10 – 2021-05-11 (×2): 100 mg via ORAL
  Filled 2021-05-10 (×2): qty 1

## 2021-05-10 MED ORDER — STROKE: EARLY STAGES OF RECOVERY BOOK
Freq: Once | Status: AC
  Filled 2021-05-10: qty 1

## 2021-05-10 MED ORDER — ALBUTEROL SULFATE (2.5 MG/3ML) 0.083% IN NEBU: 3.0000 mL | INHALATION_SOLUTION | Freq: Four times a day (QID) | RESPIRATORY_TRACT | Status: DC | PRN

## 2021-05-10 MED ORDER — ACETAMINOPHEN 325 MG PO TABS
650.0000 mg | ORAL_TABLET | ORAL | Status: DC | PRN
Start: 2021-05-10 — End: 2021-05-11

## 2021-05-10 MED ORDER — ACETAMINOPHEN 650 MG RE SUPP: 650.0000 mg | RECTAL | Status: DC | PRN

## 2021-05-10 MED ORDER — ADULT MULTIVITAMIN W/MINERALS CH
1.0000 | ORAL_TABLET | Freq: Every day | ORAL | Status: DC
  Administered 2021-05-10 – 2021-05-11 (×2): 1 via ORAL
  Filled 2021-05-10 (×2): qty 1

## 2021-05-10 MED ORDER — SODIUM CHLORIDE 0.9 % IV SOLN: INTRAVENOUS | Status: DC

## 2021-05-10 MED ORDER — FOLIC ACID 1 MG PO TABS
1.0000 mg | ORAL_TABLET | Freq: Every day | ORAL | Status: DC
  Administered 2021-05-10 – 2021-05-11 (×2): 1 mg via ORAL
  Filled 2021-05-10 (×2): qty 1

## 2021-05-10 MED ORDER — HEPARIN (PORCINE) 25000 UT/250ML-% IV SOLN
1300.0000 [IU]/h | INTRAVENOUS | Status: DC
  Administered 2021-05-10: 1100 [IU]/h via INTRAVENOUS
  Filled 2021-05-10: qty 250

## 2021-05-10 MED ORDER — LORAZEPAM 1 MG PO TABS: 1.0000 mg | ORAL_TABLET | ORAL | Status: DC | PRN

## 2021-05-10 MED ORDER — OYSTER SHELL CALCIUM/D3 500-5 MG-MCG PO TABS
1.0000 | ORAL_TABLET | Freq: Two times a day (BID) | ORAL | Status: DC
  Administered 2021-05-10 – 2021-05-11 (×3): 1 via ORAL
  Filled 2021-05-10 (×4): qty 1

## 2021-05-10 MED ORDER — ACETAMINOPHEN 160 MG/5ML PO SOLN: 650.0000 mg | ORAL | Status: DC | PRN

## 2021-05-10 MED ORDER — FLUOXETINE HCL 20 MG PO CAPS
40.0000 mg | ORAL_CAPSULE | Freq: Every day | ORAL | Status: DC
Start: 2021-05-10 — End: 2021-05-11
  Administered 2021-05-10 – 2021-05-11 (×2): 40 mg via ORAL
  Filled 2021-05-10 (×2): qty 2

## 2021-05-10 NOTE — Progress Notes (Signed)
On Call Neurology Note ? 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 ?Start full dose heparinization. ?Stroke team to follow. ?D/W Dr Erin Hearing ? ?-- ?Milon Dikes, MD ?Neurologist ?[facility] ?Pager: [phone] ? ?

## 2021-05-10 NOTE — Progress Notes (Signed)
EEG complete - results pending 

## 2021-05-10 NOTE — Procedures (Signed)
Patient Name: Nicole Macdonald  ?MRN: [id number]  ?Epilepsy Attending: Lora Havens  ?Referring Physician/Provider: Garvin Fila, MD ?Date: 05/10/2021 ?Duration: 22.20 mins ? ?Patient history:  51 year old female presenting with new onset of left sided weakness and garbled speech. EEG to evaluate for seizure ? ?Level of alertness: Awake,asleep ? ?AEDs during EEG study: None ? ?Technical aspects: This EEG study was done with scalp electrodes positioned according to the 10-20 International system of electrode placement. Electrical activity was acquired at a sampling rate of 500Hz  and reviewed with a high frequency filter of 70Hz  and a low frequency filter of 1Hz . EEG data were recorded continuously and digitally stored.  ? ?Description: No clear posterior dominant rhythm was seen.  Sleep was characterized by vertex waves, sleep spindles (12 to 14 Hz), maximal frontocentral region.  There is an excessive amount of 15 to 18 Hz beta activity distributed symmetrically and diffusely. Hyperventilation and photic stimulation were not performed.    ? ?ABNORMALITY ?- Excessive beta, generalized ? ?IMPRESSION: ?This study is within normal limits. The excessive beta activity seen in the background is most likely due to the effect of benzodiazepine and is a benign EEG pattern. No seizures or epileptiform discharges were seen throughout the recording. ? ?Lora Havens  ? ?

## 2021-05-10 NOTE — Assessment & Plan Note (Signed)
-   The patient will be placed on as needed IV Ativan for withdrawal and continued on CIWA protocol. ?- We will place her on a banana bag daily.Marland Kitchen ? ?

## 2021-05-10 NOTE — Progress Notes (Signed)
?  Echocardiogram ?2D Echocardiogram has been performed. ? ?Nicole Macdonald ?05/10/2021, 4:02 PM ?

## 2021-05-10 NOTE — Progress Notes (Addendum)
STROKE TEAM PROGRESS NOTE  ? ?HISTORY OF PRESENT ILLNESS (per record) ?Nicole Macdonald is an 51 y.o. female with a PMHx of bipolar disorder and manic depression, presenting via EMS as a Code Stroke for new onset of left sided weakness and garbled speech. On arrival to the ED, the patient is with mixed sedated and agitated affect, exhibits slurred speech, tremulousness, poor attention and left sided weakness as well as alternating apparent weakness of the lower quadrants of her face. She relates a story, pieced together after repeated questioning, of her working at a [employer] on either Sunday, Monday or Tuesday, at which time she allowed a homeless man to occupy the [place]. She states that at some point she loses clear memories of the events but noted that her drink and cigarettes were outside of the laundromat, the man had disappeared, that she felt ill and was disoriented. She made it back home at some point and was out sick the next day or two, versus having one or two days off. Last night she states that she may have been drinking. Today, she was apparently incoherent in her home and EMS was called. When they found her, she was weak, incoordinated and with slurred speech and disorientation. Her mother was with her and apparently was too intoxicated to contribute useful history.  ? ? ?INTERVAL HISTORY ?Patient is awake and interactive.  She still has left-sided numbness and the weakness seems to be improving.  MRI scan shows no acute infarct.  CT venogram shows left transverse, sigmoid sinus and jugular vein thrombosis but it is unclear if this is acute or responsible for patient's symptoms.  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  Patient denies any prior history of deep vein thrombosis, pulmonary embolism, recurrent miscarriages or family history of venous clots.  She denies using hormones of birth control  pills. ? ? ? ?OBJECTIVE ?Vitals:  ? 05/10/21 0830 05/10/21 0900 05/10/21 0930 05/10/21 1000  ?BP: 106/80 111/85 121/73 109/67  ?Pulse: (!) 59 64 66 63  ?Resp: 14 17 15 20   ?Temp:      ?TempSrc:      ?SpO2: 98% 98% 98% 97%  ?Weight:      ?Height:      ? ? ?CBC:  ?Recent Labs  ?Lab 05/09/21 ?1546 05/09/21 ?1551 05/10/21 ?[id number]  ?WBC 6.6  --  5.5  ?NEUTROABS 3.5  --   --   ?HGB 12.5 13.3 11.2*  ?HCT 38.3 39.0 34.6*  ?MCV 106.4*  --  104.2*  ?PLT 211  --  187  ? ? ?Basic Metabolic Panel:  ?Recent Labs  ?Lab 05/09/21 ?1546 05/09/21 ?1551 05/10/21 ?[id number]  ?NA 145 147* 139  ?K 3.2* 3.3* 3.4*  ?CL 112* 110 109  ?CO2 24  --  25  ?GLUCOSE 124* 123* 95  ?BUN 5* 7 6  ?CREATININE 0.74 0.90 0.58  ?CALCIUM 8.9  --  8.3*  ?MG  --   --  2.0  ? ? ?Lipid Panel:  ?   ?Component Value Date/Time  ? CHOL 174 05/10/2021 0623  ? TRIG 51 05/10/2021 0623  ? HDL 66 05/10/2021 0623  ? CHOLHDL 2.6 05/10/2021 0623  ? VLDL 10 05/10/2021 0623  ? [place] 98 05/10/2021 0623  ? ?HgbA1c:  ?Lab Results  ?Component Value Date  ? HGBA1C 4.9 05/10/2021  ? ?Urine Drug Screen:  ?   ?Component Value Date/Time  ? LABOPIA NONE  DETECTED 05/09/2021 2006  ? COCAINSCRNUR POSITIVE (A) 05/09/2021 2006  ? COCAINSCRNUR POSITIVE (A) 11/05/2019 0202  ? LABBENZ POSITIVE (A) 05/09/2021 2006  ? [place] DETECTED 05/09/2021 2006  ? [place] DETECTED 05/09/2021 2006  ? [place] DETECTED 05/09/2021 2006  ?  ?Alcohol Level  ?   ?Component Value Date/Time  ? ETH 239 (H) 05/09/2021 1546  ? ? ?IMAGING ? ? ?MR BRAIN WO CONTRAST ? ?Result Date: 05/09/2021 ?CLINICAL DATA:  Neuro deficit, acute, stroke suspected. Left-sided weakness, slurred speech, and facial droop. EXAM: MRI HEAD WITHOUT CONTRAST TECHNIQUE: Multiplanar, multiecho pulse sequences of the brain and surrounding structures were obtained without intravenous contrast. COMPARISON:  Head CT, CTA, and CTP 05/09/2021 FINDINGS: The examination had to be discontinued prior to completion due to the patient's condition and  movement. A complete noncontrast brain MRI was obtained with exception of coronal T2 imaging. No IV contrast was administered, and the requested MRV was not obtained. Brain: There is no evidence of an acute infarct, intracranial hemorrhage, mass, midline shift, or extra-axial fluid collection. The ventricles and sulci are within normal limits for age. No age advanced white matter disease is evident. Vascular: Absent flow void in the left sigmoid sinus. Skull and upper cervical spine: Unremarkable bone marrow signal. Sinuses/Orbits: Unremarkable orbits. Minimal mucosal thick scar asset mild scattered mucosal thickening in the paranasal sinuses. No significant mastoid fluid. Other: None. IMPRESSION: 1. Incomplete, motion degraded examination. 2.  No acute infarct. 3. Abnormal appearance of the left sigmoid sinus corresponding to suspected thrombosis on today's CTA. Electronically Signed   By: Logan Bores M.D.   On: 05/09/2021 18:35  ? ?CT CEREBRAL PERFUSION W CONTRAST ? ?Result Date: 05/09/2021 ?CLINICAL DATA:  Neuro deficit, acute, stroke suspected EXAM: CT ANGIOGRAPHY HEAD AND NECK CT PERFUSION BRAIN TECHNIQUE: Multidetector CT imaging of the head and neck was performed using the standard protocol during bolus administration of intravenous contrast. Multiplanar CT image reconstructions and MIPs were obtained to evaluate the vascular anatomy. Carotid stenosis measurements (when applicable) are obtained utilizing NASCET criteria, using the distal internal carotid diameter as the denominator. Multiphase CT imaging of the brain was performed following IV bolus contrast injection. Subsequent parametric perfusion maps were calculated using RAPID software. RADIATION DOSE REDUCTION: This exam was performed according to the departmental dose-optimization program which includes automated exposure control, adjustment of the mA and/or kV according to patient size and/or use of iterative reconstruction technique. CONTRAST:  42mL  OMNIPAQUE IOHEXOL 350 MG/ML SOLN COMPARISON:  CT head 05/09/2021 and 11/05/2019, no prior CTA. FINDINGS: CT HEAD FINDINGS For noncontrast findings, please see same day CT head. CTA NECK FINDINGS Aortic arch: 4 vessel arch, with the origin of the left vertebral artery from the aorta. Imaged portion shows no evidence of aneurysm or dissection. No significant stenosis of the major arch vessel origins. Right carotid system: No evidence of dissection, stenosis (50% or greater) or occlusion. Left carotid system: No evidence of dissection, stenosis (50% or greater) or occlusion. Vertebral arteries: Calcifications at the left vertebral artery origin, from the aorta, which do not appear hemodynamically significant. No evidence of dissection, occlusion, or hemodynamically significant stenosis (greater than 50%). Skeleton: No acute osseous abnormality. Other neck: Negative. Upper chest: Emphysema. No acute pulmonary opacity or pleural effusion. Review of the MIP images confirms the above findings CTA HEAD FINDINGS Anterior circulation: Both internal carotid arteries are patent to the termini, without significant stenosis. A1 segments patent. Normal anterior communicating artery. Anterior cerebral arteries are patent to  their distal aspects. No M1 stenosis or occlusion. Normal MCA bifurcations. Distal MCA branches perfused and symmetric. Posterior circulation: Vertebral arteries patent to the vertebrobasilar junction without stenosis. Posterior inferior cerebral arteries patent bilaterally. Basilar patent to its distal aspect. Superior cerebellar arteries patent bilaterally. Patent P1 segments. PCAs perfused to their distal aspects without stenosis. The bilateral posterior communicating arteries are not visualized. Venous sinuses: As permitted by contrast timing, patent. Anatomic variants: Filling defect in the left sigmoid sinus (series 7, image 120 with reconstitution in the left IJ. Review of the MIP images confirms the  above findings CT Brain Perfusion Findings: ASPECTS: 10 CBF (<30%) Volume: 8mL Perfusion (Tmax>6.0s) volume: 17mL Mismatch Volume: 64mL Infarction Location:None IMPRESSION: 1. Filling defect in the left sigmoid si

## 2021-05-10 NOTE — Assessment & Plan Note (Signed)
I counseled her for smoking cessation and she will receive further counseling here. ?

## 2021-05-10 NOTE — Progress Notes (Signed)
ANTICOAGULATION CONSULT NOTE - Initial Consult ? ?Pharmacy Consult for Heparin>>apixaban ?Indication: Left sigmoid sinus and internal jugular vein thrombosis ? ?Allergies  ?Allergen Reactions  ? Codeine Hives  ? ? ?Patient Measurements: ?Height: 5\' 3"  (160 cm) ?Weight: 66.7 kg (147 lb 0.8 oz) ?IBW/kg (Calculated) : 52.4 ?Heparin Dosing Weight: 70 kg ? ?Vital Signs: ?Temp: 98.2 ?F (36.8 ?C) (04/06 1650) ?BP: 125/74 (04/06 1650) ?Pulse Rate: 69 (04/06 1650) ? ?Labs: ?Recent Labs  ?  05/09/21 ?1546 05/09/21 ?1551 05/10/21 ?07/10/21 05/10/21 ?1231  ?HGB 12.5 13.3 11.2*  --   ?HCT 38.3 39.0 34.6*  --   ?PLT 211  --  187  --   ?APTT 23*  --   --   --   ?LABPROT 11.1*  --   --   --   ?INR 0.8  --   --   --   ?HEPARINUNFRC  --   --   --  0.15*  ?CREATININE 0.74 0.90 0.58  --   ? ? ? ?Estimated Creatinine Clearance: 74.6 mL/min (by C-G formula based on SCr of 0.58 mg/dL). ? ? ?Medical History: ?Past Medical History:  ?Diagnosis Date  ? Bipolar 1 disorder (HCC)   ? Manic depression (HCC)   ? ? ?Medications:  ?No current facility-administered medications on file prior to encounter.  ? ?Current Outpatient Medications on File Prior to Encounter  ?Medication Sig Dispense Refill  ? albuterol (VENTOLIN HFA) 108 (90 Base) MCG/ACT inhaler Inhale 2 puffs into the lungs every 6 (six) hours as needed for wheezing or shortness of breath. 8 g 0  ? amoxicillin-clavulanate (AUGMENTIN) 875-125 MG tablet Take 1 tablet by mouth 2 (two) times daily. 10 day course, pt states she has about 3 days left    ? amphetamine-dextroamphetamine (ADDERALL) 20 MG tablet Take 20 mg by mouth daily.    ? BIOTIN PO Take 1 tablet by mouth daily.    ? clonazePAM (KLONOPIN) 2 MG tablet Take 2 mg by mouth 4 (four) times daily.     ? FLUoxetine (PROZAC) 40 MG capsule Take 40 mg by mouth daily.    ? fluticasone (FLONASE) 50 MCG/ACT nasal spray Place 2 sprays into both nostrils daily.    ? ibuprofen (ADVIL) 200 MG tablet Take 600 mg by mouth daily as needed for headache.     ? calcium-vitamin D (OSCAL 500/200 D-3) 500-200 MG-UNIT tablet Take 1 tablet by mouth 2 (two) times daily. (Patient not taking: Reported on 05/10/2021) 180 tablet 3  ? docusate sodium (COLACE) 100 MG capsule Take 1 capsule (100 mg total) by mouth 2 (two) times daily. (Patient not taking: Reported on 05/10/2021) 10 capsule 0  ? nicotine (NICODERM CQ - DOSED IN MG/24 HOURS) 21 mg/24hr patch Place 1 patch (21 mg total) onto the skin daily. (Patient not taking: Reported on 05/10/2021) 28 patch 0  ? [DISCONTINUED] chlorpheniramine-HYDROcodone 10-8 MG/5ML Take 5 mLs by mouth 2 (two) times daily as needed.    ? [DISCONTINUED] FLUoxetine (PROZAC) 20 MG capsule Take 20 mg by mouth daily.    ? [DISCONTINUED] Lidocaine 4 % PTCH Place onto the skin.    ?  ? ?Assessment: ?51 y.o. female with AMS, found to have left sigmoid sinus and internal jugular vein thrombosis, for heparin ? ? ?Plan to transition to apixaban tonight without load per Dr 40. ? ? ?Goal of Therapy:  ?Monitor platelets by anticoagulation protocol: Yes ?  ?Plan:  ?Dc heparin ?Apixaban 5mg  BID ?Rx will follow peripherally ? ?Axis,  PharmD, BCIDP, AAHIVP, CPP ?Infectious Disease Pharmacist ?05/10/2021 5:24 PM ? ? ? ?

## 2021-05-10 NOTE — ED Notes (Signed)
ECHO at bedside.

## 2021-05-10 NOTE — Progress Notes (Signed)
ANTICOAGULATION CONSULT NOTE - Follow Up Consult ? ?Pharmacy Consult for IV heparin ?Indication: Left sigmoid sinus and internal jugular vein thrombosis ? ?Allergies  ?Allergen Reactions  ? Codeine Hives  ? ? ?Patient Measurements: ?Height: 5\' 3"  (160 cm) ?Weight: 72 kg (158 lb 11.7 oz) ?IBW/kg (Calculated) : 52.4 ?Heparin Dosing Weight: 67.5 kg  ? ?Vital Signs: ?BP: 100/73 (04/06 1030) ?Pulse Rate: 82 (04/06 1030) ? ?Labs: ?Recent Labs  ?  05/09/21 ?1546 05/09/21 ?1551 05/10/21 ?07/10/21 05/10/21 ?1231  ?HGB 12.5 13.3 11.2*  --   ?HCT 38.3 39.0 34.6*  --   ?PLT 211  --  187  --   ?APTT 23*  --   --   --   ?LABPROT 11.1*  --   --   --   ?INR 0.8  --   --   --   ?HEPARINUNFRC  --   --   --  0.15*  ?CREATININE 0.74 0.90 0.58  --   ? ? ?Estimated Creatinine Clearance: 77.3 mL/min (by C-G formula based on SCr of 0.58 mg/dL). ? ?Assessment: ?51 y.o. female with AMS, found to have left sigmoid sinus and internal jugular vein thrombosis, for heparin ? ?Most recent heparin level subtherapeutic at 0.15. Per RN, patient has been bending arm, but otherwise no issues with infusion.  ? ?Goal of Therapy:  ?Heparin level 0.3-0.7 units/ml ?Monitor platelets by anticoagulation protocol: Yes ?  ?Plan:  ?Increase to heparin 1300 units/hr ?Heparin level in 6h  ?Monitor for s/sx of bleeding ? ?Thank you for including pharmacy in the care of this patient. ? ?40, PharmD ?PGY1 Acute Care Pharmacy Resident  ?Phone: [phone] ?05/10/2021  7:25 PM ? ?Please check [email or web address] for unit-specific pharmacy phone numbers. ? ? ? ?

## 2021-05-10 NOTE — TOC CAGE-AID Note (Signed)
Transition of Care (TOC) - CAGE-AID Screening ? ? ?Patient Details  ?Name: Nicole Macdonald ?MRN: [id number] ?Date of Birth: May 05, 1970 ? ?Transition of Care (TOC) CM/SW Contact:    ?Delorise Hunkele C Tarpley-Carter, LCSWA ?Phone Number: ?05/10/2021, 10:52 AM ? ? ?Clinical Narrative: ?Pt participated in [place].  Pt stated she does use substance and ETOH.  Pt was offered resources, due to usage of substance and ETOH.    ? ?Passenger transport manager, MSW, LCSW-A ?Pronouns:  She/Her/Hers ?[facility] of [facility] ?Clinical Social Worker ?Direct Number:  [phone] ?[email or web address]  ? ?CAGE-AID Screening: ?  ? ?Have You Ever Felt You Ought to Cut Down on Your Drinking or Drug Use?: No ?Have People Annoyed You By Critizing Your Drinking Or Drug Use?: No ?Have You Felt Bad Or Guilty About Your Drinking Or Drug Use?: No ?Have You Ever Had a Drink or Used Drugs First Thing In The Morning to Steady Your Nerves or to Get Rid of a Hangover?: No ?CAGE-AID Score: 0 ? ?Substance Abuse Education Offered: Yes ? ?Substance abuse interventions: Educational Materials ? ? ? ? ? ? ?

## 2021-05-10 NOTE — Progress Notes (Signed)
ANTICOAGULATION CONSULT NOTE - Initial Consult ? ?Pharmacy Consult for Heparin ?Indication: Left sigmoid sinus and internal jugular vein thrombosis ? ?No Known Allergies ? ?Patient Measurements: ?Height: 5\' 3"  (160 cm) ?Weight: 72 kg (158 lb 11.7 oz) ?IBW/kg (Calculated) : 52.4 ?Heparin Dosing Weight: 70 kg ? ?Vital Signs: ?Temp: 98.3 ?F (36.8 ?C) (04/05 2018) ?Temp Source: Oral (04/05 2018) ?BP: 108/81 (04/06 0000) ?Pulse Rate: 69 (04/06 0000) ? ?Labs: ?Recent Labs  ?  05/09/21 ?1546 05/09/21 ?1551  ?HGB 12.5 13.3  ?HCT 38.3 39.0  ?PLT 211  --   ?APTT 23*  --   ?LABPROT 11.1*  --   ?INR 0.8  --   ?CREATININE 0.74 0.90  ? ? ?Estimated Creatinine Clearance: 68.7 mL/min (by C-G formula based on SCr of 0.9 mg/dL). ? ? ?Medical History: ?Past Medical History:  ?Diagnosis Date  ? Bipolar 1 disorder (HCC)   ? Manic depression (HCC)   ? ? ?Medications:  ?No current facility-administered medications on file prior to encounter.  ? ?Current Outpatient Medications on File Prior to Encounter  ?Medication Sig Dispense Refill  ? albuterol (VENTOLIN HFA) 108 (90 Base) MCG/ACT inhaler Inhale 2 puffs into the lungs every 6 (six) hours as needed for wheezing or shortness of breath. 8 g 0  ? amphetamine-dextroamphetamine (ADDERALL) 20 MG tablet Take 20 mg by mouth daily.    ? calcium-vitamin D (OSCAL 500/200 D-3) 500-200 MG-UNIT tablet Take 1 tablet by mouth 2 (two) times daily. 180 tablet 3  ? clonazePAM (KLONOPIN) 2 MG tablet Take 2 mg by mouth 4 (four) times daily.     ? docusate sodium (COLACE) 100 MG capsule Take 1 capsule (100 mg total) by mouth 2 (two) times daily. 10 capsule 0  ? FLUoxetine (PROZAC) 20 MG capsule Take 20 mg by mouth daily.    ? Lidocaine 4 % PTCH Place onto the skin.    ? nicotine (NICODERM CQ - DOSED IN MG/24 HOURS) 21 mg/24hr patch Place 1 patch (21 mg total) onto the skin daily. 28 patch 0  ?  ? ?Assessment: ?51 y.o. female with AMS, found to have left sigmoid sinus and internal jugular vein thrombosis, for  heparin ? ?Goal of Therapy:  ?Heparin level 0.3-0.5 units/mL ?Monitor platelets by anticoagulation protocol: Yes ?  ?Plan:  ?Start heparin 1100 units/hr ?Check heparin level in 8 hours.  ? ?Milind Raether, 40 ?05/10/2021,12:16 AM ? ? ?

## 2021-05-10 NOTE — H&P (Addendum)
?  ?  ?[facility] ? ? ?PATIENT NAME: Nicole Macdonald   ? ?MR#:  [id number] ? ?DATE OF BIRTH:  05/06/1970 ? ?DATE OF ADMISSION:  05/09/2021 ? ?PRIMARY CARE PHYSICIAN: System, Provider Not In  ? ?Patient is coming from: Home ? ?REQUESTING/REFERRING PHYSICIAN: Mesner, Barbara Cower, MD  ? ?CHIEF COMPLAINT:  ? ?Chief Complaint  ?Patient presents with  ? Code Stroke  ? ? ?HISTORY OF PRESENT ILLNESS:  ?Nicole Macdonald is a 51 y.o. Caucasian female with medical history significant for bipolar disorder and alcohol abuse, who presented to the emergency room with acute onset of left-sided numbness with mild weakness with initial left facial droop as well as expressive dysphagia and headache that started earlier yesterday morning.  She denies any vertigo or tinnitus.  No urinary or stools incontinence.  No witnessed seizures.  She denies any chest pain or palpitations.  No fever or chills.  No cough or wheezing or dyspnea.  No dysuria, oliguria, hematuria urgency or frequency or flank pain.  No diarrhea or constipation or abdominal pain.  No bleeding diathesis.  Her last alcoholic drink was yesterday morning.  She usually drinks about 4 alcoholic shots per day.  Neurology consult was obtained earlier by Dr. Otelia Limes. ? ?ED Course: When she came to the ER, respiratory it was 22 with otherwise normal vital signs.  Labs revealed potassium was 3.3 and sodium 147.  CBC was remarkable for macrocytosis.  PT was 11.1 with INR 0.8 with PTT 23.  Alcohol level was 239. ?EKG as reviewed by me : EKG showed normal sinus rhythm with a rate of 66 with Q waves anteroseptally. ?Imaging: Noncontrasted CT scan revealed no acute intracranial abnormalities.  It showed bubbly fluid in the left sphenoid sinus that can be seen with acute sinusitis. ?Head and neck CTA revealed: ?1. Filling defect in the left sigmoid sinus, concerning for venous ?sinus thrombosis. Consider CTV. ?2.  No intracranial large vessel occlusion or significant stenosis. ?3.  No  hemodynamically significant stenosis in the neck. ?4. No infarct core or penumbra on CT perfusion imaging. ? ?Head CTV revealed: ?Left sigmoid sinus and internal jugular vein thrombosis. ? ?The patient was given IV heparin with a bolus and drip, multivitamins, thiamine and folic acid as well as 2 mg of IV Ativan and was placed on CIWA protocol.  She will be admitted to a medical telemetry bed for further evaluation and management. ?PAST MEDICAL HISTORY:  ? ?Past Medical History:  ?Diagnosis Date  ? Bipolar 1 disorder (HCC)   ? Manic depression (HCC)   ? ? ?PAST SURGICAL HISTORY:  ? ?Past Surgical History:  ?Procedure Laterality Date  ? COMPRESSION HIP SCREW Left 04/19/2017  ? Procedure: left hip cannulated screw pinning;  Surgeon: Christena Flake, MD;  Location: [facility] ORS;  Service: Orthopedics;  Laterality: Left;  ? denies    ? ? ?SOCIAL HISTORY:  ? ?Social History  ? ?Tobacco Use  ? Smoking status: Every Day  ?  Packs/day: 0.50  ?  Types: Cigarettes  ? Smokeless tobacco: Never  ?Substance Use Topics  ? Alcohol use: Yes  ? ? ?FAMILY HISTORY:  ?Positive for lung cancer in her grandmother. ? ?DRUG ALLERGIES:  ?No Known Allergies ? ?REVIEW OF SYSTEMS:  ? ?ROS ?As per history of present illness. All pertinent systems were reviewed above. Constitutional, HEENT, cardiovascular, respiratory, GI, GU, musculoskeletal, neuro, psychiatric, endocrine, integumentary and hematologic systems were reviewed and are otherwise negative/unremarkable except for positive findings mentioned above in the  HPI. ? ? ?MEDICATIONS AT HOME:  ? ?Prior to Admission medications   ?Medication Sig Start Date End Date Taking? Authorizing Provider  ?albuterol (VENTOLIN HFA) 108 (90 Base) MCG/ACT inhaler Inhale 2 puffs into the lungs every 6 (six) hours as needed for wheezing or shortness of breath. 11/05/19   Chesley Noon, MD  ?amphetamine-dextroamphetamine (ADDERALL) 20 MG tablet Take 20 mg by mouth daily.    [provider]  ?calcium-vitamin  D (OSCAL 500/200 D-3) 500-200 MG-UNIT tablet Take 1 tablet by mouth 2 (two) times daily. 04/20/17 04/20/18  Adrian Saran, MD  ?clonazePAM (KLONOPIN) 2 MG tablet Take 2 mg by mouth 4 (four) times daily.     [provider]  ?docusate sodium (COLACE) 100 MG capsule Take 1 capsule (100 mg total) by mouth 2 (two) times daily. 04/20/17   Adrian Saran, MD  ?FLUoxetine (PROZAC) 20 MG capsule Take 20 mg by mouth daily.    [provider]  ?Lidocaine 4 % PTCH Place onto the skin. 04/07/17   [provider]  ?nicotine (NICODERM CQ - DOSED IN MG/24 HOURS) 21 mg/24hr patch Place 1 patch (21 mg total) onto the skin daily. 04/20/17   Adrian Saran, MD  ? ?  ? ?VITAL SIGNS:  ?Blood pressure 105/65, pulse 78, temperature 98.3 ?F (36.8 ?C), temperature source Oral, resp. rate 18, height 5\' 3"  (1.6 m), weight 72 kg, SpO2 95 %. ? ?PHYSICAL EXAMINATION:  ?Physical Exam ? ?GENERAL:  51 y.o.-year-old Caucasian female patient lying in the bed with no acute distress.  ?EYES: Pupils equal, round, reactive to light and accommodation. No scleral icterus. Extraocular muscles intact.  ?HEENT: Head atraumatic, normocephalic. Oropharynx and nasopharynx clear.  ?NECK:  Supple, no jugular venous distention. No thyroid enlargement, no tenderness.  ?LUNGS: Normal breath sounds bilaterally, no wheezing, rales,rhonchi or crepitation. No use of accessory muscles of respiration.  ?CARDIOVASCULAR: Regular rate and rhythm, S1, S2 normal. No murmurs, rubs, or gallops.  ?ABDOMEN: Soft, nondistended, nontender. Bowel sounds present. No organomegaly or mass.  ?EXTREMITIES: No pedal edema, cyanosis, or clubbing.  ?NEUROLOGIC: Cranial nerves II through XII are intact. Muscle strength 5/5 in all extremities. Sensation intact. Gait not checked.  ?PSYCHIATRIC: The patient is alert and oriented x 3.  Normal affect and good eye contact. ?SKIN: No obvious rash, lesion, or ulcer.  ? ?LABORATORY PANEL:  ? ?CBC ?Recent Labs  ?Lab 05/09/21 ?1546  05/09/21 ?1551  ?WBC 6.6  --   ?HGB 12.5 13.3  ?HCT 38.3 39.0  ?PLT 211  --   ? ?------------------------------------------------------------------------------------------------------------------ ? ?Chemistries  ?Recent Labs  ?Lab 05/09/21 ?1546 05/09/21 ?1551  ?NA 145 147*  ?K 3.2* 3.3*  ?CL 112* 110  ?CO2 24  --   ?GLUCOSE 124* 123*  ?BUN 5* 7  ?CREATININE 0.74 0.90  ?CALCIUM 8.9  --   ?AST 63*  --   ?ALT 50*  --   ?ALKPHOS 49  --   ?BILITOT 0.2*  --   ? ?------------------------------------------------------------------------------------------------------------------ ? ?Cardiac Enzymes ?No results for input(s): TROPONINI in the last 168 hours. ?------------------------------------------------------------------------------------------------------------------ ? ?RADIOLOGY:  ?MR BRAIN WO CONTRAST ? ?Result Date: 05/09/2021 ?CLINICAL DATA:  Neuro deficit, acute, stroke suspected. Left-sided weakness, slurred speech, and facial droop. EXAM: MRI HEAD WITHOUT CONTRAST TECHNIQUE: Multiplanar, multiecho pulse sequences of the brain and surrounding structures were obtained without intravenous contrast. COMPARISON:  Head CT, CTA, and CTP 05/09/2021 FINDINGS: The examination had to be discontinued prior to completion due to the patient's condition and movement. A complete noncontrast  brain MRI was obtained with exception of coronal T2 imaging. No IV contrast was administered, and the requested MRV was not obtained. Brain: There is no evidence of an acute infarct, intracranial hemorrhage, mass, midline shift, or extra-axial fluid collection. The ventricles and sulci are within normal limits for age. No age advanced white matter disease is evident. Vascular: Absent flow void in the left sigmoid sinus. Skull and upper cervical spine: Unremarkable bone marrow signal. Sinuses/Orbits: Unremarkable orbits. Minimal mucosal thick scar asset mild scattered mucosal thickening in the paranasal sinuses. No significant mastoid fluid. Other:  None. IMPRESSION: 1. Incomplete, motion degraded examination. 2.  No acute infarct. 3. Abnormal appearance of the left sigmoid sinus corresponding to suspected thrombosis on today's CTA. Electronically Signed   B

## 2021-05-10 NOTE — Assessment & Plan Note (Signed)
-   We will continue Prozac and Klonopin. ?

## 2021-05-10 NOTE — Assessment & Plan Note (Signed)
-   This is also contributing to her symptoms. ?- Management as above. ?

## 2021-05-10 NOTE — Progress Notes (Signed)
?                                  PROGRESS NOTE                                             ?                                                                                                                     ?                                         ? ? Patient Demographics:  ? ? Nicole Macdonald, is a 51 y.o. female, DOB - 1971/01/19, ZOX:[id number] ? ?Outpatient Primary MD for the patient is System, Provider Not In    LOS - 0  Admit date - 05/09/2021   ? ?Chief Complaint  ?Patient presents with  ? Code Stroke  ?    ? ?Brief Narrative (HPI from H&P)  - 51 y.o. Caucasian female with medical history significant for bipolar disorder and alcohol abuse, who presented to the emergency room with acute onset of left-sided numbness with mild weakness with initial left facial droop as well as expressive dysphagia and headache that started earlier yesterday morning, work-up was positive for left sigmoid sinus and IJ vein thrombosis. ? ? Subjective:  ? ? [facility], No headache, No chest pain, No abdominal pain - No Nausea, mild L sided weakness, no SOB ? ? Assessment  & Plan :  ? 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  Causing some left-sided weakness and facial droop.  Symptoms much improved.  She has been seen by neurology and placed on heparin drip, PT OT and speech to follow.  Speech and swallowing bedside appears stable at this point.  We will have her follow with hematology outpatient, likely discharge on oral Eliquis in the next 1 to 2 days, echo pending. ? ?Alcohol intoxication (HCC), tobacco and cocaine abuse.  Counseled to quit all.  On CIWA protocol. ?  ?Anxiety and depression - We will continue Prozac and Klonopin. ? ?   ? ?Condition - Fair ? ?Family Communication  :  none present ? ?Code Status :  Full ? ?Consults  :  Neuro ? ?PUD Prophylaxis :  ? ? Procedures  :    ? ?MRI - 1. Incomplete, motion degraded examination. 2.  No acute  infarct. 3. Abnormal appearance of the left sigmoid sinus corresponding to suspected thrombosis on today's CTA.  ? ?CTV -  Left sigmoid sinus and internal jugular vein thrombosis. ? ?CTA - 1. Filling defect in the left sigmoid sinus, concerning for venous sinus thrombosis. Consider  CTV. 2.  No intracranial large vessel occlusion or significant stenosis. 3.  No hemodynamically significant stenosis in the neck. 4. No infarct core or penumbra on CT perfusion imaging.  ? ?   ? ?Disposition Plan  :   ? ?Status is: Inpatient ? ? ?DVT Prophylaxis  :  Hep gtt ? ?Lab Results  ?Component Value Date  ? PLT 187 05/10/2021  ? ? ?Diet :  ?Diet Order   ? ?       ?  DIET SOFT Room service appropriate? Yes; Fluid consistency: Nectar Thick  Diet effective now       ?  ? ?  ?  ? ?  ?  ? ?Inpatient Medications ? ?Scheduled Meds: ?  stroke: mapping our early stages of recovery book   Does not apply Once  ? calcium-vitamin D  1 tablet Oral BID  ? docusate sodium  100 mg Oral BID  ? FLUoxetine  40 mg Oral Daily  ? folic acid  1 mg Oral Daily  ? multivitamin with minerals  1 tablet Oral Daily  ? thiamine  100 mg Oral Daily  ? Or  ? thiamine  100 mg Intravenous Daily  ? ?Continuous Infusions: ? sodium chloride 75 mL/hr at 05/10/21 0639  ? heparin 1,100 Units/hr (05/10/21 0737)  ? ?PRN Meds:.acetaminophen **OR** acetaminophen (TYLENOL) oral liquid 160 mg/5 mL **OR** acetaminophen, albuterol, clonazePAM, LORazepam **OR** LORazepam, LORazepam, senna-docusate ? ?Antibiotics  :   ? ?Anti-infectives (From admission, onward)  ? ? None  ? ?  ? ? ? Time Spent in minutes  30 ? ? ?Susa Raring M.D on 05/10/2021 at 9:43 AM ? ?To page go to [email or web address]  ? ?[facility] -  Office  [phone] ? ?See all Orders from today for further details ? ? ? Objective:  ? ?Vitals:  ? 05/10/21 0800 05/10/21 0830 05/10/21 0900 05/10/21 0930  ?BP: 110/72 106/80 111/85 121/73  ?Pulse: 68 (!) 59 64 66  ?Resp: 14 14 17 15   ?Temp:      ?TempSrc:      ?SpO2: 98%  98% 98% 98%  ?Weight:      ?Height:      ? ? ?Wt Readings from Last 3 Encounters:  ?05/09/21 72 kg  ?11/12/18 72.6 kg  ?04/19/17 68 kg  ? ? ? ?Intake/Output Summary (Last 24 hours) at 05/10/2021 0943 ?Last data filed at 05/10/2021 [phone] ?Gross per 24 hour  ?Intake 270 ml  ?Output 700 ml  ?Net -430 ml  ? ? ? ?Physical Exam ? ?Awake Alert, mild L sided weakness ?[place].AT,PERRAL ?Supple Neck, No JVD,   ?Symmetrical Chest wall movement, Good air movement bilaterally, CTAB ?RRR,No Gallops,Rubs or new Murmurs,  ?+ve B.Sounds, Abd Soft, No tenderness,   ?No Cyanosis, Clubbing or edema  ?  ? ? Data Review:  ? ? ?CBC ?Recent Labs  ?Lab 05/09/21 ?1546 05/09/21 ?1551 05/10/21 ?07/10/21  ?WBC 6.6  --  5.5  ?HGB 12.5 13.3 11.2*  ?HCT 38.3 39.0 34.6*  ?PLT 211  --  187  ?MCV 106.4*  --  104.2*  ?MCH 34.7*  --  33.7  ?MCHC 32.6  --  32.4  ?RDW 13.3  --  13.5  ?LYMPHSABS 2.5  --   --   ?MONOABS 0.5  --   --   ?EOSABS 0.1  --   --   ?BASOSABS 0.1  --   --   ? ? ?Electrolytes ?Recent Labs  ?Lab 05/09/21 ?1546 05/09/21 ?1551 05/10/21 ?07/10/21  05/10/21 ?3086  ?NA 145 147*  --  139  ?K 3.2* 3.3*  --  3.4*  ?CL 112* 110  --  109  ?CO2 24  --   --  25  ?GLUCOSE 124* 123*  --  95  ?BUN 5* 7  --  6  ?CREATININE 0.74 0.90  --  0.58  ?CALCIUM 8.9  --   --  8.3*  ?AST 63*  --   --  46*  ?ALT 50*  --   --  43  ?ALKPHOS 49  --   --  45  ?BILITOT 0.2*  --   --  0.4  ?ALBUMIN 3.7  --   --  3.3*  ?MG  --   --   --  2.0  ?CRP  --   --   --  0.6  ?INR 0.8  --   --   --   ?HGBA1C  --   --  4.9  --   ?BNP  --   --  31.8  --   ? ? ?------------------------------------------------------------------------------------------------------------------ ?Recent Labs  ?  05/10/21 ?0623  ?CHOL 174  ?HDL 66  ?LDLCALC 98  ?TRIG 51  ?CHOLHDL 2.6  ? ? ?Lab Results  ?Component Value Date  ? HGBA1C 4.9 05/10/2021  ? ? ?No results for input(s): TSH, T4TOTAL, T3FREE, THYROIDAB in the last 72 hours. ? ?Invalid input(s):  FREET3 ?------------------------------------------------------------------------------------------------------------------ ?ID Labs ?Recent Labs  ?Lab 05/09/21 ?1546 05/09/21 ?1551 05/10/21 ?5784  ?WBC 6.6  --  5.5  ?PLT 211  --  187  ?CRP  --   --  0.6  ?CREATININE 0.74 0.90 0.58  ? ?Cardiac Enzymes ?No results for input(s): CKMB, TROPONINI, MYOGLOBIN in the last 168 hours. ? ?Invalid input(s): CK ? ? ?  ? ? ?Micro Results ?No results found for this or any previous visit (from the past 240 hour(s)). ? ?Radiology Reports ?MR BRAIN WO CONTRAST ? ?Result Date: 05/09/2021 ?CLINICAL DATA:  Neuro deficit, acute, stroke suspected. Left-sided weakness, slurred speech, and facial droop. EXAM: MRI HEAD WITHOUT CONTRAST TECHNIQUE: Multiplanar, multiecho pulse sequences of the brain and surrounding structures were obtained without intravenous contrast. COMPARISON:  Head CT, CTA, and CTP 05/09/2021 FINDINGS: The examination had to be discontinued prior to completion due to the patient's condition and movement. A complete noncontrast brain MRI was obtained with exception of coronal T2 imaging. No IV contrast was administered, and the requested MRV was not obtained. Brain: There is no evidence of an acute infarct, intracranial hemorrhage, mass, midline shift, or extra-axial fluid collection. The ventricles and sulci are within normal limits for age. No age advanced white matter disease is evident. Vascular: Absent flow void in the left sigmoid sinus. Skull and upper cervical spine: Unremarkable bone marrow signal. Sinuses/Orbits: Unremarkable orbits. Minimal mucosal thick scar asset mild scattered mucosal thickening in the paranasal sinuses. No significant mastoid fluid. Other: None. IMPRESSION: 1. Incomplete, motion degraded examination. 2.  No acute infarct. 3. Abnormal appearance of the left sigmoid sinus corresponding to suspected thrombosis on today's CTA. Electronically Signed   By: Sebastian Ache M.D.   On: 05/09/2021 18:35   ? ?CT CEREBRAL PERFUSION W CONTRAST ? ?Result Date: 05/09/2021 ?CLINICAL DATA:  Neuro deficit, acute, stroke suspected EXAM: CT ANGIOGRAPHY HEAD AND NECK CT PERFUSION BRAIN TECHNIQUE: Multidetector CT imaging of the head and neck was performed using the standard protocol duri

## 2021-05-10 NOTE — Assessment & Plan Note (Signed)
-   The patient has left-sided numbness and earlier weakness resolving as well as dysarthria/expressive aphasia resolving. ?- She will be admitted to a medical telemetry bed. ?- We will continue her on IV heparin. ?- Neurology follow-up will be obtained. ?- PT/OT and ST consults will be obtained. ?- We will obtain a 2D echo. ?

## 2021-05-11 ENCOUNTER — Other Ambulatory Visit (HOSPITAL_COMMUNITY): Payer: Self-pay

## 2021-05-11 DIAGNOSIS — G08 Intracranial and intraspinal phlebitis and thrombophlebitis: Secondary | ICD-10-CM | POA: Diagnosis not present

## 2021-05-11 LAB — COMPREHENSIVE METABOLIC PANEL
ALT: 37 U/L (ref 0–44)
AST: 33 U/L (ref 15–41)
Albumin: 3.1 g/dL — ABNORMAL LOW (ref 3.5–5.0)
Alkaline Phosphatase: 45 U/L (ref 38–126)
Anion gap: 4 — ABNORMAL LOW (ref 5–15)
BUN: 8 mg/dL (ref 6–20)
CO2: 25 mmol/L (ref 22–32)
Calcium: 9 mg/dL (ref 8.9–10.3)
Chloride: 111 mmol/L (ref 98–111)
Creatinine, Ser: 0.66 mg/dL (ref 0.44–1.00)
GFR, Estimated: >60 mL/min (ref >60)
Glucose, Bld: 98 mg/dL (ref 70–99)
Potassium: 3.7 mmol/L (ref 3.5–5.1)
Sodium: 140 mmol/L (ref 135–145)
Total Bilirubin: 0.3 mg/dL (ref 0.3–1.2)
Total Protein: 5.4 g/dL — ABNORMAL LOW (ref 6.5–8.1)

## 2021-05-11 LAB — CBC WITH DIFFERENTIAL/PLATELET
Abs Immature Granulocytes: 0.01 10*3/uL (ref 0.00–0.07)
Basophils Absolute: 0.1 10*3/uL (ref 0.0–0.1)
Basophils Relative: 1 %
Eosinophils Absolute: 0.1 10*3/uL (ref 0.0–0.5)
Eosinophils Relative: 2 %
HCT: 33.8 % — ABNORMAL LOW (ref 36.0–46.0)
Hemoglobin: 11 g/dL — ABNORMAL LOW (ref 12.0–15.0)
Immature Granulocytes: 0 %
Lymphocytes Relative: 33 %
Lymphs Abs: 1.6 10*3/uL (ref 0.7–4.0)
MCH: 33.7 pg (ref 26.0–34.0)
MCHC: 32.5 g/dL (ref 30.0–36.0)
MCV: 103.7 fL — ABNORMAL HIGH (ref 80.0–100.0)
Monocytes Absolute: 0.4 10*3/uL (ref 0.1–1.0)
Monocytes Relative: 7 %
Neutro Abs: 2.8 10*3/uL (ref 1.7–7.7)
Neutrophils Relative %: 57 %
Platelets: 155 10*3/uL (ref 150–400)
RBC: 3.26 MIL/uL — ABNORMAL LOW (ref 3.87–5.11)
RDW: 13.2 % (ref 11.5–15.5)
WBC: 4.9 10*3/uL (ref 4.0–10.5)
nRBC: 0 % (ref 0.0–0.2)

## 2021-05-11 LAB — CARDIOLIPIN ANTIBODIES, IGG, IGM, IGA
Anticardiolipin IgA: <9 APL U/mL (ref 0–11)
Anticardiolipin IgG: <9 GPL U/mL (ref 0–14)
Anticardiolipin IgM: 10 MPL U/mL (ref 0–12)

## 2021-05-11 LAB — HOMOCYSTEINE: Homocysteine: 16.8 umol/L — ABNORMAL HIGH (ref 0.0–14.5)

## 2021-05-11 LAB — BRAIN NATRIURETIC PEPTIDE: B Natriuretic Peptide: 128.5 pg/mL — ABNORMAL HIGH (ref 0.0–100.0)

## 2021-05-11 LAB — BETA-2-GLYCOPROTEIN I ABS, IGG/M/A
Beta-2 Glyco I IgG: <9 GPI IgG units (ref 0–20)
Beta-2-Glycoprotein I IgA: <9 GPI IgA units (ref 0–25)
Beta-2-Glycoprotein I IgM: <9 GPI IgM units (ref 0–32)

## 2021-05-11 LAB — MAGNESIUM: Magnesium: 1.9 mg/dL (ref 1.7–2.4)

## 2021-05-11 LAB — PROTEIN C, TOTAL: Protein C, Total: 79 % (ref 60–150)

## 2021-05-11 MED ORDER — ROSUVASTATIN CALCIUM 20 MG PO TABS
20.0000 mg | ORAL_TABLET | Freq: Every day | ORAL | 0 refills | Status: DC
  Filled 2021-05-11: qty 30, 30d supply, fill #0

## 2021-05-11 MED ORDER — THIAMINE HCL 100 MG PO TABS
100.0000 mg | ORAL_TABLET | Freq: Every day | ORAL | 0 refills | Status: DC
  Filled 2021-05-11: qty 30, 30d supply, fill #0

## 2021-05-11 MED ORDER — APIXABAN 5 MG PO TABS
5.0000 mg | ORAL_TABLET | Freq: Two times a day (BID) | ORAL | 0 refills | Status: DC
  Filled 2021-05-11: qty 60, 30d supply, fill #0

## 2021-05-11 MED ORDER — FOLIC ACID 1 MG PO TABS
1.0000 mg | ORAL_TABLET | Freq: Every day | ORAL | 0 refills | Status: DC
  Filled 2021-05-11: qty 30, 30d supply, fill #0

## 2021-05-11 MED ORDER — NICOTINE 21 MG/24HR TD PT24
21.0000 mg | MEDICATED_PATCH | Freq: Every day | TRANSDERMAL | 0 refills | Status: AC
Start: 2021-05-11 — End: ?

## 2021-05-11 MED ORDER — ROSUVASTATIN CALCIUM 20 MG PO TABS
20.0000 mg | ORAL_TABLET | Freq: Every day | ORAL | Status: DC
  Administered 2021-05-11: 20 mg via ORAL
  Filled 2021-05-11: qty 1

## 2021-05-11 NOTE — Discharge Instructions (Signed)
Follow with Primary MD  in 7 days   Get CBC, CMP, 2 view Chest X ray -  checked next visit within 1 week by Primary MD   Activity: As tolerated with Full fall precautions use walker/cane & assistance as needed  Disposition Home    Diet: Heart Healthy    Special Instructions: If you have smoked or chewed Tobacco  in the last 2 yrs please stop smoking, stop any regular Alcohol  and or any Recreational drug use.  On your next visit with your primary care physician please Get Medicines reviewed and adjusted.  Please request your Prim.MD to go over all Hospital Tests and Procedure/Radiological results at the follow up, please get all Hospital records sent to your Prim MD by signing hospital release before you go home.  If you experience worsening of your admission symptoms, develop shortness of breath, life threatening emergency, suicidal or homicidal thoughts you must seek medical attention immediately by calling 911 or calling your MD immediately  if symptoms less severe.  You Must read complete instructions/literature along with all the possible adverse reactions/side effects for all the Medicines you take and that have been prescribed to you. Take any new Medicines after you have completely understood and accpet all the possible adverse reactions/side effects.      

## 2021-05-11 NOTE — Discharge Summary (Signed)
?                                                                                ? Nicole Macdonald [id number]:[id number] DOB: 06-08-1970 DOA: 05/09/2021 ? ?PCP: System, Provider Not In ? ?Admit date: 05/09/2021  Discharge date: 05/11/2021 ? ?Admitted From: Home   Disposition:  Home ? ? ?Recommendations for Outpatient Follow-up:  ? ?Follow up with PCP in 1-2 weeks ? ?PCP Please obtain BMP/CBC, 2 view CXR in 1week,  (see Discharge instructions)  ? ?PCP Please follow up on the following pending results: Follow results of hypercoagulable work-up.  Needs minimum of 6 months of Eliquis thereafter per hypercoagulable work-up and hematologist. ? ? ?Home Health: None   ?Equipment/Devices: None  ?Consultations: Neuro ?Discharge Condition: Stable    ?CODE STATUS: Full    ?Diet Recommendation: Heart Healthy  ?  ? ?Chief Complaint  ?Patient presents with  ? Code Stroke  ?  ? ?Brief history of present illness from the day of admission and additional interim summary   ? ?51 y.o. Caucasian female with medical history significant for bipolar disorder and alcohol abuse, who presented to the emergency room with acute onset of left-sided numbness with mild weakness with initial left facial droop as well as expressive dysphagia and headache that started earlier yesterday morning, work-up was positive for left sigmoid sinus and IJ vein thrombosis. ? ?                                                               Hospital Course  ? 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  Causing some left-sided weakness and facial droop.  Symptoms much improved and now close to baseline, no speech or swallowing issues, on exam no focal weakness minimal left upper extremity subjective weakness.  She has been seen by neurology and was placed on heparin drip, now switched to Eliquis, LDL was above goal hence she has been started on Crestor, A1c was  stable.  Echocardiogram unremarkable.  She has been strictly counseled to quit cocaine, alcohol and smoking.   ? ?She will be seen and once cleared by speech, PT and OT she will be discharged home.  If she qualifies for any of these modalities they will be ordered at home.  Case discussed with neurologist Dr. Pearlean Brownie.  She will follow closely with outpatient neurology, PCP and hematology.  Note hypercoagulable work-up was ordered upon admission and is pending.  She is being discharged on Eliquis and Crestor combination. ? ?  ?Alcohol intoxication (HCC), tobacco and cocaine abuse.  Counseled to quit all.  No signs of DTs NicoDerm patch provided.  PCP to monitor. ?  ?Anxiety and depression - We will continue Prozac and Klonopin. ? ? ?Discharge diagnosis   ? ? ?Principal Problem: ?  Cerebral venous thrombosis of sigmoid sinus ?Active Problems: ?  Acute thrombosis of left internal jugular vein (HCC) ?  Alcohol intoxication (HCC) ?  Anxiety  and depression ?  Tobacco abuse ? ? ? ?Discharge instructions   ? ?Discharge Instructions   ? ? Diet - low sodium heart healthy   Complete by: As directed ?  ? Increase activity slowly   Complete by: As directed ?  ? ?  ? ? ?Discharge Medications  ? ?Allergies as of 05/11/2021   ? ?   Reactions  ? Codeine Hives  ? ?  ? ?  ?Medication List  ?  ? ?STOP taking these medications   ? ?amoxicillin-clavulanate 875-125 MG tablet ?Commonly known as: AUGMENTIN ?  ?docusate sodium 100 MG capsule ?Commonly known as: COLACE ?  ?ibuprofen 200 MG tablet ?Commonly known as: ADVIL ?  ? ?  ? ?TAKE these medications   ? ?albuterol 108 (90 Base) MCG/ACT inhaler ?Commonly known as: VENTOLIN HFA ?Inhale 2 puffs into the lungs every 6 (six) hours as needed for wheezing or shortness of breath. ?  ?amphetamine-dextroamphetamine 20 MG tablet ?Commonly known as: ADDERALL ?Take 20 mg by mouth daily. ?  ?apixaban 5 MG Tabs tablet ?Commonly known as: ELIQUIS ?Take 1 tablet (5 mg total) by mouth 2 (two) times daily. ?   ?BIOTIN PO ?Take 1 tablet by mouth daily. ?  ?calcium-vitamin D 500-200 MG-UNIT tablet ?Commonly known as: [place] 500/200 D-3 ?Take 1 tablet by mouth 2 (two) times daily. ?  ?clonazePAM 2 MG tablet ?Commonly known as: KLONOPIN ?Take 2 mg by mouth 4 (four) times daily. ?  ?FLUoxetine 40 MG capsule ?Commonly known as: PROZAC ?Take 40 mg by mouth daily. ?  ?fluticasone 50 MCG/ACT nasal spray ?Commonly known as: FLONASE ?Place 2 sprays into both nostrils daily. ?  ?folic acid 1 MG tablet ?Commonly known as: FOLVITE ?Take 1 tablet (1 mg total) by mouth daily. ?  ?nicotine 21 mg/24hr patch ?Commonly known as: NICODERM CQ - dosed in mg/24 hours ?Place 1 patch (21 mg total) onto the skin daily. ?  ?rosuvastatin 20 MG tablet ?Commonly known as: CRESTOR ?Take 1 tablet (20 mg total) by mouth daily. ?  ?thiamine 100 MG tablet ?Take 1 tablet (100 mg total) by mouth daily. ?  ? ?  ? ? ? Follow-up Information   ? ? [facility]. Schedule an appointment as soon as possible for a visit in 1 week(s).   ?Contact information: ?[address] ?[place] [phone] ?[phone] ? ?  ?  ? ? [facility]. Schedule an appointment as soon as possible for a visit in 2 month(s).   ?Contact information: ?[address]     [address] ?[place] [phone] ?[phone] ? ?  ?  ? ? Artis DelayGorsuch, Ni, MD. Schedule an appointment as soon as possible for a visit in 1 month(s).   ?Specialty: Hematology and Oncology ?Why: Hypercoagulable work-up ?Contact information: ?[address] ?[place] [phone] ?[phone] ? ? ?  ?  ? ?  ?  ? ?  ? ? ?Major procedures and Radiology Reports - PLEASE review detailed and final reports thoroughly  -    ? ?  ?MR BRAIN WO CONTRAST ? ?Result Date: 05/09/2021 ?CLINICAL DATA:  Neuro deficit, acute, stroke suspected. Left-sided weakness, slurred speech, and facial droop. EXAM: MRI HEAD WITHOUT CONTRAST TECHNIQUE:  Multiplanar, multiecho pulse sequences of the brain and surrounding structures were obtained without intravenous contrast. COMPARISON:  Head CT, CTA, and CTP 05/09/2021 FINDINGS: The examination had to be discontinued prior to completion due to the patient's  condition and movement. A complete noncontrast brain MRI was obtained with exception of coronal T2 imaging. No IV contrast was administered, and the requested MRV was not obtained. Brain: There is no evidence of an acute infarct, intracranial hemorrhage, mass, midline shift, or extra-axial fluid collection. The ventricles and sulci are within normal limits for age. No age advanced white matter disease is evident. Vascular: Absent flow void in the left sigmoid sinus. Skull and upper cervical spine: Unremarkable bone marrow signal. Sinuses/Orbits: Unremarkable orbits. Minimal mucosal thick scar asset mild scattered mucosal thickening in the paranasal sinuses. No significant mastoid fluid. Other: None. IMPRESSION: 1. Incomplete, motion degraded examination. 2.  No acute infarct. 3. Abnormal appearance of the left sigmoid sinus corresponding to suspected thrombosis on today's CTA. Electronically Signed   By: Sebastian Ache M.D.   On: 05/09/2021 18:35  ? ?CT CEREBRAL PERFUSION W CONTRAST ? ?Result Date: 05/09/2021 ?CLINICAL DATA:  Neuro deficit, acute, stroke suspected EXAM: CT ANGIOGRAPHY HEAD AND NECK CT PERFUSION BRAIN TECHNIQUE: Multidetector CT imaging of the head and neck was performed using the standard protocol during bolus administration of intravenous contrast. Multiplanar CT image reconstructions and MIPs were obtained to evaluate the vascular anatomy. Carotid stenosis measurements (when applicable) are obtained utilizing NASCET criteria, using the distal internal carotid diameter as the denominator. Multiphase CT imaging of the brain was performed following IV bolus contrast injection. Subsequent parametric perfusion maps were calculated using RAPID software.  RADIATION DOSE REDUCTION: This exam was performed according to the departmental dose-optimization program which includes automated exposure control, adjustment of the mA and/or kV according to patient

## 2021-05-11 NOTE — Progress Notes (Signed)
Discharge instructions given. Patient verbalized understanding and all questions were answered.  ?

## 2021-05-11 NOTE — Evaluation (Signed)
Physical Therapy Evaluation ?Patient Details ?Name: Nicole Macdonald ?MRN: [id number] ?DOB: [birth year] ?Today's Date: 05/11/2021 ? ?History of Present Illness ? 51 y.o. Caucasian female with medical history significant for bipolar disorder and alcohol abuse, who presented to the emergency room 4/5 with acute onset of left-sided numbness with mild weakness with initial left facial droop as well as expressive dysphagia and headache that started earlier yesterday morning, work-up was positive for left sigmoid sinus and IJ vein thrombosis. Drug screen positive for cocaine. 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 ?  ?Clinical Impression ? PT eval complete. Pt demonstrates independence with bed mobility and transfers. Supervision provided for ambulation 350' without AD and ascend/descend 5 steps without rails. Pt scored 21/24 on DGI balance assessement. Pt reports mild numbness LUE/LE. No further skilled PT intervention indicated. PT signing off.    ?   ? ?Recommendations for follow up therapy are one component of a multi-disciplinary discharge planning process, led by the attending physician.  Recommendations may be updated based on patient status, additional functional criteria and insurance authorization. ? ?Follow Up Recommendations No PT follow up ? ?  ?Assistance Recommended at Discharge PRN  ?Patient can return home with the following ?   ? ?  ?Equipment Recommendations None recommended by PT  ?Recommendations for Other Services ?    ?  ?Functional Status Assessment Patient has had a recent decline in their functional status and demonstrates the ability to make significant improvements in function in a reasonable and predictable amount of time.  ? ?  ?Precautions / Restrictions Precautions ?Precautions: None  ? ?  ? ?Mobility ? Bed Mobility ?Overal bed mobility: Independent ?  ?  ?  ?  ?  ?  ?  ?  ? ?Transfers ?Overall transfer level: Independent ?Equipment used: None ?  ?  ?  ?  ?  ?  ?  ?  ?   ? ?Ambulation/Gait ?Ambulation/Gait assistance: Supervision ?Gait Distance (Feet): 350 Feet ?Assistive device: None ?Gait Pattern/deviations: Step-through pattern, Decreased stride length ?Gait velocity: decreased ?Gait velocity interpretation: >2.62 ft/sec, indicative of community ambulatory ?  ?General Gait Details: mildly unsteady but no overt LOB, no physical assist needed ? ?Stairs ?Stairs: Yes ?Stairs assistance: Supervision ?Stair Management: No rails, Step to pattern, Forwards ?Number of Stairs: 5 ?  ? ?Wheelchair Mobility ?  ? ?Modified Rankin (Stroke Patients Only) ?Modified Rankin (Stroke Patients Only) ?Pre-Morbid Rankin Score: No symptoms ?Modified Rankin: No significant disability ? ?  ? ?Balance   ?  ?  ?  ?  ?  ?  ?  ?  ?  ?  ?  ?  ?  ?  ?  ?Standardized Balance Assessment ?Standardized Balance Assessment : Dynamic Gait Index ?  ?Dynamic Gait Index ?Level Surface: Normal ?Change in Gait Speed: Normal ?Gait with Horizontal Head Turns: Normal ?Gait with Vertical Head Turns: Mild Impairment ?Gait and Pivot Turn: Normal ?Step Over Obstacle: Mild Impairment ?Step Around Obstacles: Normal ?Steps: Mild Impairment ?Total Score: 21 ?   ? ? ? ?Pertinent Vitals/Pain Pain Assessment ?Pain Assessment: No/denies pain  ? ? ?Home Living Family/patient expects to be discharged to:: Private residence ?Living Arrangements: Spouse/significant other;Parent ?Available Help at Discharge: Family;Available 24 hours/day ?Type of Home: House ?Home Access: Stairs to enter ?Entrance Stairs-Rails: None ?Entrance Stairs-Number of Steps: 2 ?  ?Home Layout: One level ?Home Equipment: None ?   ?  ?Prior Function Prior Level of Function : Independent/Modified Independent;Driving ?  ?  ?  ?  ?  ?  ?  ?  ?  ? ? ?  Hand Dominance  ? Dominant Hand: Right ? ?  ?Extremity/Trunk Assessment  ? Upper Extremity Assessment ?Upper Extremity Assessment: Defer to OT evaluation ?  ? ?Lower Extremity Assessment ?Lower Extremity Assessment: LLE  deficits/detail ?LLE Deficits / Details: mild numbness. strength intact ?  ? ?Cervical / Trunk Assessment ?Cervical / Trunk Assessment: Normal  ?Communication  ? Communication: No difficulties  ?Cognition Arousal/Alertness: Awake/alert ?Behavior During Therapy: [facility] for tasks assessed/performed ?Overall Cognitive Status: Within Functional Limits for tasks assessed ?  ?  ?  ?  ?  ?  ?  ?  ?  ?  ?  ?  ?  ?  ?  ?  ?  ?  ?  ? ?  ?General Comments   ? ?  ?Exercises    ? ?Assessment/Plan  ?  ?PT Assessment Patient does not need any further PT services  ?PT Problem List   ? ?   ?  ?PT Treatment Interventions     ? ?PT Goals (Current goals can be found in the Care Plan section)  ?Acute Rehab PT Goals ?Patient Stated Goal: home ?PT Goal Formulation: All assessment and education complete, DC therapy ? ?  ?Frequency   ?  ? ? ?Co-evaluation   ?  ?  ?  ?  ? ? ?  ?AM-PAC PT "6 Clicks" Mobility  ?Outcome Measure Help needed turning from your back to your side while in a flat bed without using bedrails?: None ?Help needed moving from lying on your back to sitting on the side of a flat bed without using bedrails?: None ?Help needed moving to and from a bed to a chair (including a wheelchair)?: None ?Help needed standing up from a chair using your arms (e.g., wheelchair or bedside chair)?: None ?Help needed to walk in hospital room?: A Little ?Help needed climbing 3-5 steps with a railing? : A Little ?6 Click Score: 22 ? ?  ?End of Session Equipment Utilized During Treatment: Gait belt ?Activity Tolerance: Patient tolerated treatment well ?Patient left: in chair ?Nurse Communication: Mobility status ?PT Visit Diagnosis: Difficulty in walking, not elsewhere classified (R26.2) ?  ? ?Time: 4628-6381 ?PT Time Calculation (min) (ACUTE ONLY): 17 min ? ? ?Charges:   PT Evaluation ?$PT Eval Moderate Complexity: 1 Mod ?  ?  ?   ? ? ?Aida Raider, PT  ?Office # [phone] ?Pager [phone] ? ? ?Ilda Foil ?05/11/2021, 9:17 AM ? ?

## 2021-05-11 NOTE — Evaluation (Signed)
Occupational Therapy Evaluation ?Patient Details ?Name: Nicole Macdonald ?MRN: [id number] ?DOB: February 11, 1970 ?Today's Date: 05/11/2021 ? ? ?History of Present Illness 51 y.o. Caucasian female with medical history significant for bipolar disorder and alcohol abuse, who presented to the emergency room 4/5 with acute onset of left-sided numbness with mild weakness with initial left facial droop as well as expressive dysphagia and headache that started earlier yesterday morning, work-up was positive for left sigmoid sinus and IJ vein thrombosis. Drug screen positive for cocaine. 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  ? ?Clinical Impression ?  ?Pt admitted for concerns listed above. PTA pt reported that she was independent with all ADL's and IADL's, including working part time and driving. At this time, pt presents back at her baseline, with no numbness/tingling, or weakness outside of her baseline noted. She requires no assist with ADL's or functional mobility. Pt has no further OT needs and acute OT will sign off.   ?   ? ?Recommendations for follow up therapy are one component of a multi-disciplinary discharge planning process, led by the attending physician.  Recommendations may be updated based on patient status, additional functional criteria and insurance authorization.  ? ?Follow Up Recommendations ? No OT follow up  ?  ?Assistance Recommended at Discharge None  ?Patient can return home with the following   ? ?  ?Functional Status Assessment ? Patient has had a recent decline in their functional status and demonstrates the ability to make significant improvements in function in a reasonable and predictable amount of time.  ?Equipment Recommendations ? None recommended by OT  ?  ?Recommendations for Other Services   ? ? ?  ?Precautions / Restrictions Precautions ?Precautions: None ?Restrictions ?Weight Bearing Restrictions: No  ? ?  ? ?Mobility Bed Mobility ?Overal bed mobility: Independent ?  ?   ?  ?  ?  ?  ?  ?  ? ?Transfers ?Overall transfer level: Independent ?Equipment used: None ?  ?  ?  ?  ?  ?  ?  ?  ?  ? ?  ?Balance Overall balance assessment: No apparent balance deficits (not formally assessed) ?  ?  ?  ?  ?  ?  ?  ?  ?  ?  ?  ?  ?  ?  ?  ?  ?  ?  ?   ? ?ADL either performed or assessed with clinical judgement  ? ?ADL Overall ADL's : Independent;At baseline ?  ?  ?  ?  ?  ?  ?  ?  ?  ?  ?  ?  ?  ?  ?  ?  ?  ?  ?  ?General ADL Comments: Pt able to complete ADL's with no difficulties at her baseline. Balance is good.  ? ? ? ?Vision Baseline Vision/History: 0 No visual deficits ?Ability to See in Adequate Light: 0 Adequate ?Patient Visual Report: No change from baseline ?Vision Assessment?: No apparent visual deficits  ?   ?Perception   ?  ?Praxis   ?  ? ?Pertinent Vitals/Pain Pain Assessment ?Pain Assessment: No/denies pain  ? ? ? ?Hand Dominance Right ?  ?Extremity/Trunk Assessment Upper Extremity Assessment ?Upper Extremity Assessment: Overall WFL for tasks assessed ?  ?Lower Extremity Assessment ?Lower Extremity Assessment: Defer to PT evaluation ?LLE Deficits / Details: mild numbness. strength intact ?  ?Cervical / Trunk Assessment ?Cervical / Trunk Assessment: Normal ?  ?Communication Communication ?Communication: No difficulties ?  ?Cognition Arousal/Alertness: Awake/alert ?  Behavior During Therapy: [facility] for tasks assessed/performed ?Overall Cognitive Status: Within Functional Limits for tasks assessed ?  ?  ?  ?  ?  ?  ?  ?  ?  ?  ?  ?  ?  ?  ?  ?  ?  ?  ?  ?General Comments  VSS on RA ? ?  ?Exercises   ?  ?Shoulder Instructions    ? ? ?Home Living Family/patient expects to be discharged to:: Private residence ?Living Arrangements: Spouse/significant other;Parent ?Available Help at Discharge: Family;Available 24 hours/day ?Type of Home: House ?Home Access: Stairs to enter ?Entrance Stairs-Number of Steps: 4 steps ?Entrance Stairs-Rails: None ?Home Layout: One level ?  ?  ?Bathroom Shower/Tub:  Tub/shower unit ?  ?Bathroom Toilet: Standard ?  ?  ?Home Equipment: None ?  ?  ? Lives With: Spouse;Other (Comment) (mother) ? ?  ?Prior Functioning/Environment Prior Level of Function : Independent/Modified Independent;Driving ?  ?  ?  ?  ?  ?  ?  ?  ?  ? ?  ?  ?OT Problem List: Impaired balance (sitting and/or standing);Decreased safety awareness ?  ?   ?OT Treatment/Interventions:    ?  ?OT Goals(Current goals can be found in the care plan section) Acute Rehab OT Goals ?Patient Stated Goal: To go home ?OT Goal Formulation: All assessment and education complete, DC therapy ?Time For Goal Achievement: 05/11/21 ?Potential to Achieve Goals: Good  ?OT Frequency:   ?  ? ?Co-evaluation   ?  ?  ?  ?  ? ?  ?AM-PAC OT "6 Clicks" Daily Activity     ?Outcome Measure Help from another person eating meals?: None ?Help from another person taking care of personal grooming?: None ?Help from another person toileting, which includes using toliet, bedpan, or urinal?: None ?Help from another person bathing (including washing, rinsing, drying)?: None ?Help from another person to put on and taking off regular upper body clothing?: None ?Help from another person to put on and taking off regular lower body clothing?: None ?6 Click Score: 24 ?  ?End of Session Nurse Communication: Mobility status ? ?Activity Tolerance: Patient tolerated treatment well ?Patient left: in bed;with call bell/phone within reach ? ?OT Visit Diagnosis: Unsteadiness on feet (R26.81);Other abnormalities of gait and mobility (R26.89);Muscle weakness (generalized) (M62.81)  ?              ?Time: [id number] ?OT Time Calculation (min): 16 min ?Charges:  OT General Charges ?$OT Visit: 1 Visit ?OT Evaluation ?$OT Eval Moderate Complexity: 1 Mod ? ?Demarie Hyneman H., OTR/L ?Acute Rehabilitation ? ?Jameison Haji Elane Yolanda Bonine ?05/11/2021, 10:45 AM ?

## 2021-05-11 NOTE — Evaluation (Signed)
Speech Language Pathology Evaluation ?Patient Details ?Name: Nicole Macdonald ?MRN: [id number] ?DOB: 11-Sep-1970 ?Today's Date: 05/11/2021 ?Time: 1001-1009 ?SLP Time Calculation (min) (ACUTE ONLY): 8 min ? ?Problem List:  ?Patient Active Problem List  ? Diagnosis Date Noted  ? Cerebral venous thrombosis of sigmoid sinus 05/10/2021  ? Acute thrombosis of left internal jugular vein ([place]) 05/10/2021  ? Alcohol intoxication ([place]) 05/10/2021  ? Anxiety and depression 05/10/2021  ? Tobacco abuse 05/10/2021  ? Suicidal ideation   ? Hip fx ([place]) 04/18/2017  ? Alcohol abuse 09/06/2014  ? Substance induced mood disorder ([place]) 09/06/2014  ? Dysthymia 09/06/2014  ? ?Past Medical History:  ?Past Medical History:  ?Diagnosis Date  ? Bipolar 1 disorder ([place])   ? Manic depression ([place])   ? ?Past Surgical History:  ?Past Surgical History:  ?Procedure Laterality Date  ? COMPRESSION HIP SCREW Left 04/19/2017  ? Procedure: left hip cannulated screw pinning;  Surgeon: Corky Mull, MD;  Location: [facility] ORS;  Service: Orthopedics;  Laterality: Left;  ? denies    ? ?HPI:  ?Pt is a 51 yo female presenting with L sided weakness, slurred speech, and AMS. MRI scan shows no acute infarct. 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 PMH includes: bipolar disorder, manic depression, tobacco use, substance abuse  ? ?Assessment / Plan / Recommendation ?Clinical Impression ? Pt appears to be at her baseline for cognition and communication, and she believes that all her presenting symptoms have resolved. She does endorse having trouble remembering things sometimes at home, like keeping up with appointments, but she says that her husband already provides support with this. No further acute SLP needs identified. ?   ?SLP Assessment ? SLP Recommendation/Assessment: Patient does not need any further [facility] ?SLP Visit Diagnosis: Cognitive communication deficit (R41.841)  ?  ?Recommendations for follow up therapy  are one component of a multi-disciplinary discharge planning process, led by the attending physician.  Recommendations may be updated based on patient status, additional functional criteria and insurance authorization. ?   ?Follow Up Recommendations ? No SLP follow up  ?  ?Assistance Recommended at Discharge ? PRN  ?Functional Status Assessment Patient has not had a recent decline in their functional status  ?Frequency and Duration    ?  ?  ?   ?SLP Evaluation ?Cognition ? Overall Cognitive Status: Within Functional Limits for tasks assessed  ?  ?   ?Comprehension ? Auditory Comprehension ?Overall Auditory Comprehension: Appears within functional limits for tasks assessed  ?  ?Expression Expression ?Primary Mode of Expression: Verbal ?Verbal Expression ?Overall Verbal Expression: Appears within functional limits for tasks assessed ?Written Expression ?Dominant Hand: Right   ?Oral / Motor ? Oral Motor/Sensory Function ?Overall Oral Motor/Sensory Function: Within functional limits ?Motor Speech ?Overall Motor Speech: Appears within functional limits for tasks assessed   ?        ? ?Osie Bond., M.A. CCC-SLP ?Acute Rehabilitation Services ?Office [phone] ? ?Secure chat preferred ? ?05/11/2021, 10:28 AM ? ?

## 2021-05-11 NOTE — Progress Notes (Signed)
STROKE TEAM PROGRESS NOTE  ? ?  ? ? ?INTERVAL HISTORY ?Patient is sitting up in bed.  She wants to go home.  She still has some numbness in the left arm but it is improving.  Vital signs are stable.  Neuro exam is unchanged ? ? ?OBJECTIVE ?Vitals:  ? 05/11/21 0002 05/11/21 0355 05/11/21 0738 05/11/21 1252  ?BP: (!) 141/75 137/74 136/77 128/80  ?Pulse: 60 63 70 63  ?Resp: 17 16 18 18   ?Temp: 97.9 ?F (36.6 ?C) 98 ?F (36.7 ?C) 98.5 ?F (36.9 ?C) 97.9 ?F (36.6 ?C)  ?TempSrc: Oral Oral Oral Oral  ?SpO2: 97% 95% 99% 99%  ?Weight:      ?Height:      ? ? ?CBC:  ?Recent Labs  ?Lab 05/09/21 ?1546 05/09/21 ?1551 05/10/21 ?07/10/21 05/11/21 ?0357  ?WBC 6.6  --  5.5 4.9  ?NEUTROABS 3.5  --   --  2.8  ?HGB 12.5   < > 11.2* 11.0*  ?HCT 38.3   < > 34.6* 33.8*  ?MCV 106.4*  --  104.2* 103.7*  ?PLT 211  --  187 155  ? < > = values in this interval not displayed.  ? ? ?Basic Metabolic Panel:  ?Recent Labs  ?Lab 05/10/21 ?07/10/21 05/11/21 ?0357  ?NA 139 140  ?K 3.4* 3.7  ?CL 109 111  ?CO2 25 25  ?GLUCOSE 95 98  ?BUN 6 8  ?CREATININE 0.58 0.66  ?CALCIUM 8.3* 9.0  ?MG 2.0 1.9  ? ? ?Lipid Panel:  ?   ?Component Value Date/Time  ? CHOL 174 05/10/2021 0623  ? TRIG 51 05/10/2021 0623  ? HDL 66 05/10/2021 0623  ? CHOLHDL 2.6 05/10/2021 0623  ? VLDL 10 05/10/2021 0623  ? LDLCALC 98 05/10/2021 0623  ? ?HgbA1c:  ?Lab Results  ?Component Value Date  ? HGBA1C 4.9 05/10/2021  ? ?Urine Drug Screen:  ?   ?Component Value Date/Time  ? LABOPIA NONE DETECTED 05/09/2021 2006  ? COCAINSCRNUR POSITIVE (A) 05/09/2021 2006  ? COCAINSCRNUR POSITIVE (A) 11/05/2019 0202  ? LABBENZ POSITIVE (A) 05/09/2021 2006  ? AMPHETMU NONE DETECTED 05/09/2021 2006  ? THCU NONE DETECTED 05/09/2021 2006  ? LABBARB NONE DETECTED 05/09/2021 2006  ?  ?Alcohol Level  ?   ?Component Value Date/Time  ? ETH 239 (H) 05/09/2021 1546  ? ? ?IMAGING ? ? ?MR BRAIN WO CONTRAST ? ?Result Date: 05/09/2021 ?CLINICAL DATA:  Neuro deficit, acute, stroke suspected. Left-sided weakness, slurred speech, and  facial droop. EXAM: MRI HEAD WITHOUT CONTRAST TECHNIQUE: Multiplanar, multiecho pulse sequences of the brain and surrounding structures were obtained without intravenous contrast. COMPARISON:  Head CT, CTA, and CTP 05/09/2021 FINDINGS: The examination had to be discontinued prior to completion due to the patient's condition and movement. A complete noncontrast brain MRI was obtained with exception of coronal T2 imaging. No IV contrast was administered, and the requested MRV was not obtained. Brain: There is no evidence of an acute infarct, intracranial hemorrhage, mass, midline shift, or extra-axial fluid collection. The ventricles and sulci are within normal limits for age. No age advanced white matter disease is evident. Vascular: Absent flow void in the left sigmoid sinus. Skull and upper cervical spine: Unremarkable bone marrow signal. Sinuses/Orbits: Unremarkable orbits. Minimal mucosal thick scar asset mild scattered mucosal thickening in the paranasal sinuses. No significant mastoid fluid. Other: None. IMPRESSION: 1. Incomplete, motion degraded examination. 2.  No acute infarct. 3. Abnormal appearance of the left sigmoid sinus corresponding to suspected thrombosis on today's CTA.  Electronically Signed   By: Sebastian AcheAllen  Grady M.D.   On: 05/09/2021 18:35  ? ?CT CEREBRAL PERFUSION W CONTRAST ? ?Result Date: 05/09/2021 ?CLINICAL DATA:  Neuro deficit, acute, stroke suspected EXAM: CT ANGIOGRAPHY HEAD AND NECK CT PERFUSION BRAIN TECHNIQUE: Multidetector CT imaging of the head and neck was performed using the standard protocol during bolus administration of intravenous contrast. Multiplanar CT image reconstructions and MIPs were obtained to evaluate the vascular anatomy. Carotid stenosis measurements (when applicable) are obtained utilizing NASCET criteria, using the distal internal carotid diameter as the denominator. Multiphase CT imaging of the brain was performed following IV bolus contrast injection. Subsequent  parametric perfusion maps were calculated using RAPID software. RADIATION DOSE REDUCTION: This exam was performed according to the departmental dose-optimization program which includes automated exposure control, adjustment of the mA and/or kV according to patient size and/or use of iterative reconstruction technique. CONTRAST:  70mL OMNIPAQUE IOHEXOL 350 MG/ML SOLN COMPARISON:  CT head 05/09/2021 and 11/05/2019, no prior CTA. FINDINGS: CT HEAD FINDINGS For noncontrast findings, please see same day CT head. CTA NECK FINDINGS Aortic arch: 4 vessel arch, with the origin of the left vertebral artery from the aorta. Imaged portion shows no evidence of aneurysm or dissection. No significant stenosis of the major arch vessel origins. Right carotid system: No evidence of dissection, stenosis (50% or greater) or occlusion. Left carotid system: No evidence of dissection, stenosis (50% or greater) or occlusion. Vertebral arteries: Calcifications at the left vertebral artery origin, from the aorta, which do not appear hemodynamically significant. No evidence of dissection, occlusion, or hemodynamically significant stenosis (greater than 50%). Skeleton: No acute osseous abnormality. Other neck: Negative. Upper chest: Emphysema. No acute pulmonary opacity or pleural effusion. Review of the MIP images confirms the above findings CTA HEAD FINDINGS Anterior circulation: Both internal carotid arteries are patent to the termini, without significant stenosis. A1 segments patent. Normal anterior communicating artery. Anterior cerebral arteries are patent to their distal aspects. No M1 stenosis or occlusion. Normal MCA bifurcations. Distal MCA branches perfused and symmetric. Posterior circulation: Vertebral arteries patent to the vertebrobasilar junction without stenosis. Posterior inferior cerebral arteries patent bilaterally. Basilar patent to its distal aspect. Superior cerebellar arteries patent bilaterally. Patent P1 segments.  PCAs perfused to their distal aspects without stenosis. The bilateral posterior communicating arteries are not visualized. Venous sinuses: As permitted by contrast timing, patent. Anatomic variants: Filling defect in the left sigmoid sinus (series 7, image 120 with reconstitution in the left IJ. Review of the MIP images confirms the above findings CT Brain Perfusion Findings: ASPECTS: 10 CBF (<30%) Volume: 0mL Perfusion (Tmax>6.0s) volume: 0mL Mismatch Volume: 0mL Infarction Location:None IMPRESSION: 1. Filling defect in the left sigmoid sinus, concerning for venous sinus thrombosis. Consider CTV. 2.  No intracranial large vessel occlusion or significant stenosis. 3.  No hemodynamically significant stenosis in the neck. 4. No infarct core or penumbra on CT perfusion imaging. Code stroke imaging results were communicated on 05/09/2021 at 4:12 pm to provider Dr. Otelia LimesLindzen via secure text paging. Electronically Signed   By: Wiliam KeAlison  Vasan M.D.   On: 05/09/2021 16:14  ? ?EEG adult ? ?Result Date: 05/10/2021 ?Charlsie QuestYadav, Priyanka O, MD     05/10/2021 12:33 PM Patient Name: Ronnie DerbyMichelle L Ivy MRN: [id number] Epilepsy Attending: Charlsie QuestPriyanka O Yadav Referring Physician/Provider: Micki RileySethi, Ahaana Rochette S, MD Date: 05/10/2021 Duration: 22.20 mins Patient history:  51 year old female presenting with new onset of left sided weakness and garbled speech. EEG to evaluate for seizure Level of alertness: Awake,asleep  AEDs during EEG study: None Technical aspects: This EEG study was done with scalp electrodes positioned according to the 10-20 International system of electrode placement. Electrical activity was acquired at a sampling rate of 500Hz  and reviewed with a high frequency filter of 70Hz  and a low frequency filter of 1Hz . EEG data were recorded continuously and digitally stored. Description: No clear posterior dominant rhythm was seen.  Sleep was characterized by vertex waves, sleep spindles (12 to 14 Hz), maximal frontocentral region.  There is an  excessive amount of 15 to 18 Hz beta activity distributed symmetrically and diffusely. Hyperventilation and photic stimulation were not performed.   ABNORMALITY - Excessive beta, generalized IMPRESSION: This study

## 2021-05-11 NOTE — TOC Transition Note (Signed)
Transition of Care (TOC) - CM/SW Discharge Note ? ? ?Patient Details  ?Name: Nicole Macdonald ?MRN: [id number] ?Date of Birth: [birth year] ? ?Transition of Care (TOC) CM/SW Contact:  ?Kermit Balo, RN ?Phone Number: ?05/11/2021, 9:54 AM ? ? ?Clinical Narrative:    ?Patient is discharging home with self care. No needs per PT/OT.  ?Pt states she has all needed DME at home but doesn't currently use any. She denies issues with transportation or home medications.  ?Patients first moth of Eliquis filled through [facility] pharmacy and delivered to the room. ?Pt has transport home today.  ? ? ?Final next level of care: Home/Self Care ?Barriers to Discharge: No Barriers Identified ? ? ?Patient Goals and CMS Choice ?  ?  ?  ? ?Discharge Placement ?  ?           ?  ?  ?  ?  ? ?Discharge Plan and Services ?  ?  ?           ?  ?  ?  ?  ?  ?  ?  ?  ?  ?  ? ?Social Determinants of Health (SDOH) Interventions ?  ? ? ?Readmission Risk Interventions ?   ? View : No data to display.  ?  ?  ?  ? ? ? ? ? ?

## 2021-05-11 NOTE — Evaluation (Signed)
Clinical/Bedside Swallow Evaluation ?Patient Details  ?Name: Nicole Macdonald ?MRN: [id number] ?Date of Birth: 25-Feb-1970 ? ?Today's Date: 05/11/2021 ?Time: SLP Start Time (ACUTE ONLY): [id number] SLP Stop Time (ACUTE ONLY): 1001 ?SLP Time Calculation (min) (ACUTE ONLY): 8 min ? ?Past Medical History:  ?Past Medical History:  ?Diagnosis Date  ? Bipolar 1 disorder (HCC)   ? Manic depression (HCC)   ? ?Past Surgical History:  ?Past Surgical History:  ?Procedure Laterality Date  ? COMPRESSION HIP SCREW Left 04/19/2017  ? Procedure: left hip cannulated screw pinning;  Surgeon: Christena Flake, MD;  Location: [facility] ORS;  Service: Orthopedics;  Laterality: Left;  ? denies    ? ?HPI:  ?Pt is a 51 yo female presenting with L sided weakness, slurred speech, and AMS. MRI scan shows no acute infarct. 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 PMH includes: bipolar disorder, manic depression, tobacco use, substance abuse  ?  ?Assessment / Plan / Recommendation  ?Clinical Impression ? Pt's oropharyngeal swallowing appears to be [facility]. She did have a baseline cough note prior to PO intake that she reports has been present since she ahd COVID a few months ago. It does not appear to occur directly associated with swallowing and she has no subjective complaints. Recommend regular solids and thin liquids. SLP to sign off acutely. ?SLP Visit Diagnosis: Dysphagia, unspecified (R13.10) ?   ?Aspiration Risk ? Mild aspiration risk  ?  ?Diet Recommendation Regular;Thin liquid  ? ?Liquid Administration via: Cup;Straw ?Medication Administration: Whole meds with liquid ?Supervision: Patient able to self feed ?Postural Changes: Seated upright at 90 degrees  ?  ?Other  Recommendations Oral Care Recommendations: Oral care BID   ? ?Recommendations for follow up therapy are one component of a multi-disciplinary discharge planning process, led by the attending physician.  Recommendations may be updated based on patient status, additional  functional criteria and insurance authorization. ? ?Follow up Recommendations No SLP follow up  ? ? ?  ?Assistance Recommended at Discharge PRN  ?Functional Status Assessment Patient has not had a recent decline in their functional status  ?Frequency and Duration    ?  ?  ?   ? ?Prognosis    ? ?  ? ?Swallow Study   ?General HPI: Pt is a 51 yo female presenting with L sided weakness, slurred speech, and AMS. MRI scan shows no acute infarct. 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 PMH includes: bipolar disorder, manic depression, tobacco use, substance abuse ?Type of Study: Bedside Swallow Evaluation ?Previous Swallow Assessment: none in chart ?Diet Prior to this Study: Dysphagia 3 (soft);Nectar-thick liquids ?Temperature Spikes Noted: No ?Respiratory Status: Room air ?History of Recent Intubation: No ?Behavior/Cognition: Alert;Cooperative;Pleasant mood ?Oral Cavity Assessment: Within Functional Limits ?Oral Care Completed by SLP: No ?Oral Cavity - Dentition: Adequate natural dentition;Missing dentition ?Vision: Functional for self-feeding ?Self-Feeding Abilities: Able to feed self ?Patient Positioning: Upright in bed ?Baseline Vocal Quality: Normal (mildl raspy) ?Volitional Cough: Strong ?Volitional Swallow: Able to elicit  ?  ?Oral/Motor/Sensory Function Overall Oral Motor/Sensory Function: Within functional limits   ?Ice Chips Ice chips: Not tested   ?Thin Liquid Thin Liquid: Within functional limits ?Presentation: Self Fed;Straw  ?  ?Nectar Thick Nectar Thick Liquid: Not tested   ?Honey Thick Honey Thick Liquid: Not tested   ?Puree Puree: Within functional limits ?Presentation: Self Fed;Spoon   ?Solid ? ? ?  Solid: Within functional limits ?Presentation: Self Fed  ? ?  ? ?Mahala Menghini., M.A. CCC-SLP ?Acute Rehabilitation Services ?Office (  [phone] ? ?Secure chat preferred ? ?05/11/2021,10:21 AM ? ? ? ?

## 2021-05-12 LAB — LUPUS ANTICOAGULANT PANEL
DRVVT: 33.4 s (ref 0.0–47.0)
PTT Lupus Anticoagulant: 43.4 s (ref 0.0–43.5)

## 2021-05-12 LAB — PROTEIN S, TOTAL: Protein S Ag, Total: 82 % (ref 60–150)

## 2021-05-12 LAB — PROTEIN C ACTIVITY: Protein C Activity: 121 % (ref 73–180)

## 2021-05-12 LAB — PROTEIN S ACTIVITY: Protein S Activity: 110 % (ref 63–140)

## 2021-05-14 ENCOUNTER — Other Ambulatory Visit: Payer: Self-pay | Admitting: Neurology

## 2021-05-15 LAB — PROTHROMBIN GENE MUTATION

## 2021-05-15 LAB — FACTOR 5 LEIDEN

## 2021-05-17 ENCOUNTER — Telehealth (HOSPITAL_COMMUNITY): Payer: Self-pay

## 2021-05-17 ENCOUNTER — Other Ambulatory Visit (HOSPITAL_COMMUNITY): Payer: Self-pay

## 2021-05-17 NOTE — Telephone Encounter (Signed)
Pharmacy Transitions of Care Follow-up Telephone Call ? ?Date of discharge: 05/11/21  ?Discharge Diagnosis: Stroke ? ?How have you been since you were released from the hospital? Patient is doing well other than a slight headache. She didn't know what to take since she couldn't take ibuprofen anymore, and I advised her to take Tylenol. She does not have a follow up appointment scheduled yet due to her records needing to be released, but she is going to continue to try and follow up ASAP. ? ?Medication changes made at discharge: ?START taking these medications ? ?START taking these medications  ?Eliquis 5 MG Tabs tablet ?Generic drug: apixaban ?Take 1 tablet (5 mg total) by mouth 2 (two) times daily.  ?folic acid 1 MG tablet ?Commonly known as: FOLVITE ?Take 1 tablet (1 mg total) by mouth daily.  ?rosuvastatin 20 MG tablet ?Commonly known as: CRESTOR ?Take 1 tablet (20 mg total) by mouth daily.  ?thiamine 100 MG tablet ?Take 1 tablet (100 mg total) by mouth daily.  ? ?CONTINUE taking these medications ? ?CONTINUE taking these medications  ?albuterol 108 (90 Base) MCG/ACT inhaler ?Commonly known as: VENTOLIN HFA ?Inhale 2 puffs into the lungs every 6 (six) hours as needed for wheezing or shortness of breath.  ?amphetamine-dextroamphetamine 20 MG tablet ?Commonly known as: ADDERALL  ?BIOTIN PO  ?calcium-vitamin D 500-200 MG-UNIT tablet ?Commonly known as: [place] 500/200 D-3 ?Take 1 tablet by mouth 2 (two) times daily.  ?clonazePAM 2 MG tablet ?Commonly known as: KLONOPIN  ?FLUoxetine 40 MG capsule ?Commonly known as: PROZAC  ?fluticasone 50 MCG/ACT nasal spray ?Commonly known as: FLONASE  ?nicotine 21 mg/24hr patch ?Commonly known as: NICODERM CQ - dosed in mg/24 hours ?Place 1 patch (21 mg total) onto the skin daily.  ? ?STOP taking these medications ? ?STOP taking these medications  ?amoxicillin-clavulanate 875-125 MG tablet ?Commonly known as: AUGMENTIN  ?docusate sodium 100 MG capsule ?Commonly known as: COLACE   ?ibuprofen 200 MG tablet ?Commonly known as: ADVIL  ? ? ?Medication changes verified by the patient? Yes ?  ? ?Medication Accessibility: ? ?Home Pharmacy: [id number]  ? ?Was the patient provided with refills on discharged medications? no  ? ?Have all prescriptions been transferred from [facility] to home pharmacy? N/a  ? ?Is the patient able to afford medications? [facility] ?Notable copays: $4 eliqus ?Eligible patient assistance: no ?  ? ?Medication Review: ?APIXABAN (ELIQUIS)  ?Apixaban 5 mg BID.  ?- Discussed importance of taking medication around the same time everyday  ?- Reviewed potential DDIs with patient  ?- Advised patient of medications to avoid (NSAIDs, ASA)  ?- Educated that Tylenol (acetaminophen) will be the preferred analgesic to prevent risk of bleeding  ?- Emphasized importance of monitoring for signs and symptoms of bleeding (abnormal bruising, prolonged bleeding, nose bleeds, bleeding from gums, discolored urine, black tarry stools)  ?- Advised patient to alert all providers of anticoagulation therapy prior to starting a new medication or having a procedure  ? ?Follow-up Appointments: ?Waiting on records release to make appointments. ? ?If their condition worsens, is the pt aware to call PCP or go to the Emergency Dept.? yes ? ?Final Patient Assessment: ?Patient is doing well other than a slight headache. She didn't know what to take since she couldn't take ibuprofen anymore, and I advised her to take Tylenol. She does not have a follow up appointment scheduled yet due to her records needing to be released, but she is going to continue to try and follow up ASAP. ? ?  Darcus Austin, PharmD ?Clinical Pharmacist ?[facility] ?05/17/2021 11:29 AM ? ?

## 2021-05-21 ENCOUNTER — Telehealth: Payer: Self-pay

## 2021-05-21 NOTE — Telephone Encounter (Signed)
No additional notes needed  

## 2021-05-30 ENCOUNTER — Inpatient Hospital Stay: Payer: Medicare Other

## 2021-05-30 ENCOUNTER — Inpatient Hospital Stay: Payer: Medicare Other | Attending: Oncology | Admitting: Oncology

## 2021-05-30 ENCOUNTER — Encounter: Payer: Self-pay | Admitting: Oncology

## 2021-05-30 VITALS — BP 99/61 | HR 65 | Temp 98.2°F | Resp 16 | Ht 63.0 in | Wt 145.9 lb

## 2021-05-30 DIAGNOSIS — D539 Nutritional anemia, unspecified: Secondary | ICD-10-CM | POA: Insufficient documentation

## 2021-05-30 DIAGNOSIS — F1091 Alcohol use, unspecified, in remission: Secondary | ICD-10-CM | POA: Insufficient documentation

## 2021-05-30 DIAGNOSIS — F17210 Nicotine dependence, cigarettes, uncomplicated: Secondary | ICD-10-CM | POA: Diagnosis not present

## 2021-05-30 DIAGNOSIS — I82C12 Acute embolism and thrombosis of left internal jugular vein: Secondary | ICD-10-CM | POA: Diagnosis present

## 2021-05-30 DIAGNOSIS — F17200 Nicotine dependence, unspecified, uncomplicated: Secondary | ICD-10-CM

## 2021-05-30 DIAGNOSIS — Z7901 Long term (current) use of anticoagulants: Secondary | ICD-10-CM | POA: Diagnosis not present

## 2021-05-30 DIAGNOSIS — Z8041 Family history of malignant neoplasm of ovary: Secondary | ICD-10-CM | POA: Insufficient documentation

## 2021-05-30 DIAGNOSIS — Z801 Family history of malignant neoplasm of trachea, bronchus and lung: Secondary | ICD-10-CM | POA: Insufficient documentation

## 2021-05-30 DIAGNOSIS — D6859 Other primary thrombophilia: Secondary | ICD-10-CM | POA: Diagnosis not present

## 2021-05-30 DIAGNOSIS — G08 Intracranial and intraspinal phlebitis and thrombophlebitis: Secondary | ICD-10-CM | POA: Diagnosis not present

## 2021-05-30 LAB — IRON AND TIBC
Iron: 67 ug/dL (ref 28–170)
Saturation Ratios: 17 % (ref 10.4–31.8)
TIBC: 385 ug/dL (ref 250–450)
UIBC: 318 ug/dL

## 2021-05-30 LAB — VITAMIN B12: Vitamin B-12: 465 pg/mL (ref 180–914)

## 2021-05-30 LAB — TSH: TSH: 1.85 u[IU]/mL (ref 0.350–4.500)

## 2021-05-30 LAB — FERRITIN: Ferritin: 16 ng/mL (ref 11–307)

## 2021-05-30 LAB — FOLATE: Folate: 25 ng/mL (ref >5.9)

## 2021-05-30 NOTE — Progress Notes (Signed)
Pt has sinus HA. She had a clot of sinus to interna jugular clot. She is sniffing a lot- the mask is making it worse. ?

## 2021-05-31 ENCOUNTER — Encounter: Payer: Self-pay | Admitting: Oncology

## 2021-05-31 NOTE — Progress Notes (Signed)
? ?Hematology/Oncology Consult note ?[facility] ?[phone]) [phone] [phone] ? ?Patient Care Team: ?System, Provider Not In as PCP - General  ? ?Name of the patient: Nicole Macdonald  ?[id number]  ?01-Sep-1970  ? ? ?Reason for referral-internal jugular vein and sigmoid sinus thrombosis ?  ?Referring physician-Dr. Susa Raring ? ?Date of visit: 05/31/21 ? ? ?History of presenting illness- Patient is a 51 year old female with a past medical history significant for alcohol and cocaine abuse who presented to the ER with symptoms of left-sided numbness and weakness as well as headache facial droop and dysphagia.  She had a CT and MRI brain as well as CT venogram head which showed left sigmoid sinus and internal jugular vein thrombosis.  As per her discharge summary there was no concern for any oropharyngeal infection that would be suggestive of septic thrombophlebitis.  Patient was discharged on Eliquis and asked to follow-up with hematology and neurology.  She had a hypercoagulable work-up done which was essentially unremarkable including antiphospholipid antibody syndrome panel.  Homocystine levels are mildly elevated at 16.8. ? ?Patient states that since her hospital discharge she has not gone back to using cocaine or alcohol.  Tolerating Eliquis well without any significant side effects.  No family history of thrombosis. ? ?ECOG PS- 1 ? ?Pain scale- 0 ? ? ?Review of systems- Review of Systems  ?Constitutional:  Positive for malaise/fatigue. Negative for chills, fever and weight loss.  ?HENT:  Negative for congestion, ear discharge and nosebleeds.   ?Eyes:  Negative for blurred vision.  ?Respiratory:  Negative for cough, hemoptysis, sputum production, shortness of breath and wheezing.   ?Cardiovascular:  Negative for chest pain, palpitations, orthopnea and claudication.  ?Gastrointestinal:  Negative for abdominal pain, blood in stool, constipation, diarrhea, heartburn, melena,  nausea and vomiting.  ?Genitourinary:  Negative for dysuria, flank pain, frequency, hematuria and urgency.  ?Musculoskeletal:  Negative for back pain, joint pain and myalgias.  ?Skin:  Negative for rash.  ?Neurological:  Negative for dizziness, tingling, focal weakness, seizures, weakness and headaches.  ?Endo/Heme/Allergies:  Does not bruise/bleed easily.  ?Psychiatric/Behavioral:  Negative for depression and suicidal ideas. The patient does not have insomnia.   ? ?Allergies  ?Allergen Reactions  ? Codeine Hives  ? ? ?Patient Active Problem List  ? Diagnosis Date Noted  ? Cerebral venous thrombosis of sigmoid sinus 05/10/2021  ? Acute thrombosis of left internal jugular vein (HCC) 05/10/2021  ? Alcohol intoxication (HCC) 05/10/2021  ? Anxiety and depression 05/10/2021  ? Tobacco abuse 05/10/2021  ? Suicidal ideation   ? Hip fx (HCC) 04/18/2017  ? Alcohol abuse 09/06/2014  ? Substance induced mood disorder (HCC) 09/06/2014  ? Dysthymia 09/06/2014  ? ? ? ?Past Medical History:  ?Diagnosis Date  ? Bipolar 1 disorder (HCC)   ? Internal jugular vein thrombosis (HCC)   ? 2023  ? Manic depression (HCC)   ? ? ? ?Past Surgical History:  ?Procedure Laterality Date  ? COMPRESSION HIP SCREW Left 04/19/2017  ? Procedure: left hip cannulated screw pinning;  Surgeon: Christena Flake, MD;  Location: [facility] ORS;  Service: Orthopedics;  Laterality: Left;  ? denies    ? ? ?Social History  ? ?Socioeconomic History  ? Marital status: Single  ?  Spouse name: Not on file  ? Number of children: Not on file  ? Years of education: Not on file  ? Highest education level: Not on file  ?Occupational History  ? Not on file  ?Tobacco Use  ?  Smoking status: Every Day  ?  Packs/day: 0.50  ?  Types: Cigarettes  ? Smokeless tobacco: Never  ?Vaping Use  ? Vaping Use: Former  ?Substance and Sexual Activity  ? Alcohol use: Yes  ? Drug use: Yes  ?  Types: Cocaine, Marijuana  ?  Comment: 15 days ago last time cocaine, marijuana complted stop  ? Sexual  activity: Not Currently  ?Other Topics Concern  ? Not on file  ?Social History Narrative  ? Not on file  ? ?Social Determinants of Health  ? ?Financial Resource Strain: Not on file  ?Food Insecurity: Not on file  ?Transportation Needs: Not on file  ?Physical Activity: Not on file  ?Stress: Not on file  ?Social Connections: Not on file  ?Intimate Partner Violence: Not on file  ? ?  ?Family History  ?Problem Relation Age of Onset  ? Ovarian cancer Mother   ? Diabetes Maternal Grandmother   ? Lung cancer Maternal Grandmother   ? ? ? ?Current Outpatient Medications:  ?  albuterol (VENTOLIN HFA) 108 (90 Base) MCG/ACT inhaler, Inhale 2 puffs into the lungs every 6 (six) hours as needed for wheezing or shortness of breath., Disp: 8 g, Rfl: 0 ?  amphetamine-dextroamphetamine (ADDERALL) 20 MG tablet, Take 20 mg by mouth daily., Disp: , Rfl:  ?  apixaban (ELIQUIS) 5 MG TABS tablet, Take 1 tablet (5 mg total) by mouth 2 (two) times daily., Disp: 60 tablet, Rfl: 0 ?  BIOTIN PO, Take 1 tablet by mouth daily., Disp: , Rfl:  ?  clonazePAM (KLONOPIN) 2 MG tablet, Take 2 mg by mouth 2 (two) times daily., Disp: , Rfl:  ?  FLUoxetine (PROZAC) 40 MG capsule, Take 40 mg by mouth daily., Disp: , Rfl:  ?  fluticasone (FLONASE) 50 MCG/ACT nasal spray, Place 2 sprays into both nostrils daily., Disp: , Rfl:  ?  rosuvastatin (CRESTOR) 20 MG tablet, Take 1 tablet (20 mg total) by mouth daily., Disp: 30 tablet, Rfl: 0 ?  nicotine (NICODERM CQ - DOSED IN MG/24 HOURS) 21 mg/24hr patch, Place 1 patch (21 mg total) onto the skin daily. (Patient not taking: Reported on 05/30/2021), Disp: 28 patch, Rfl: 0 ? ? ?Physical exam:  ?Vitals:  ? 05/30/21 1044  ?BP: 99/61  ?Pulse: 65  ?Resp: 16  ?Temp: 98.2 ?F (36.8 ?C)  ?TempSrc: Oral  ?Weight: 145 lb 14.4 oz (66.2 kg)  ?Height: 5\' 3"  (1.6 m)  ? ?Physical Exam ?Constitutional:   ?   General: She is not in acute distress. ?Cardiovascular:  ?   Rate and Rhythm: Normal rate and regular rhythm.  ?   Heart sounds:  Normal heart sounds.  ?Pulmonary:  ?   Effort: Pulmonary effort is normal.  ?   Breath sounds: Normal breath sounds.  ?Abdominal:  ?   General: Bowel sounds are normal.  ?   Palpations: Abdomen is soft.  ?Lymphadenopathy:  ?   Comments: No palpable cervical, supraclavicular, axillary or inguinal adenopathy ? ?  ?Skin: ?   General: Skin is warm and dry.  ?Neurological:  ?   Mental Status: She is alert and oriented to person, place, and time.  ?  ? ? ? ? ?  Latest Ref Rng & Units 05/11/2021  ?  3:57 AM  ?CMP  ?Glucose 70 - 99 mg/dL 98    ?BUN 6 - 20 mg/dL 8    ?Creatinine 0.44 - 1.00 mg/dL 07/11/2021    ?Sodium 135 - 145 mmol/L 140    ?  Potassium 3.5 - 5.1 mmol/L 3.7    ?Chloride 98 - 111 mmol/L 111    ?CO2 22 - 32 mmol/L 25    ?Calcium 8.9 - 10.3 mg/dL 9.0    ?Total Protein 6.5 - 8.1 g/dL 5.4    ?Total Bilirubin 0.3 - 1.2 mg/dL 0.3    ?Alkaline Phos 38 - 126 U/L 45    ?AST 15 - 41 U/L 33    ?ALT 0 - 44 U/L 37    ? ? ?  Latest Ref Rng & Units 05/11/2021  ?  3:57 AM  ?CBC  ?WBC 4.0 - 10.5 K/uL 4.9    ?Hemoglobin 12.0 - 15.0 g/dL 16.111.0    ?Hematocrit 36.0 - 46.0 % 33.8    ?Platelets 150 - 400 K/uL 155    ? ? ?No images are attached to the encounter. ? ?MR BRAIN WO CONTRAST ? ?Result Date: 05/09/2021 ?CLINICAL DATA:  Neuro deficit, acute, stroke suspected. Left-sided weakness, slurred speech, and facial droop. EXAM: MRI HEAD WITHOUT CONTRAST TECHNIQUE: Multiplanar, multiecho pulse sequences of the brain and surrounding structures were obtained without intravenous contrast. COMPARISON:  Head CT, CTA, and CTP 05/09/2021 FINDINGS: The examination had to be discontinued prior to completion due to the patient's condition and movement. A complete noncontrast brain MRI was obtained with exception of coronal T2 imaging. No IV contrast was administered, and the requested MRV was not obtained. Brain: There is no evidence of an acute infarct, intracranial hemorrhage, mass, midline shift, or extra-axial fluid collection. The ventricles and sulci  are within normal limits for age. No age advanced white matter disease is evident. Vascular: Absent flow void in the left sigmoid sinus. Skull and upper cervical spine: Unremarkable bone marrow signal. Sinuses

## 2021-06-06 LAB — JAK2 GENOTYPR

## 2021-06-13 ENCOUNTER — Ambulatory Visit
Admission: RE | Admit: 2021-06-13 | Discharge: 2021-06-13 | Disposition: A | Discharge status: Home / Self Care | Payer: Medicare Other | Source: Ambulatory Visit | Attending: Oncology | Admitting: Oncology

## 2021-06-13 DIAGNOSIS — I82C12 Acute embolism and thrombosis of left internal jugular vein: Secondary | ICD-10-CM | POA: Insufficient documentation

## 2021-06-13 DIAGNOSIS — F17200 Nicotine dependence, unspecified, uncomplicated: Secondary | ICD-10-CM | POA: Insufficient documentation

## 2021-06-15 ENCOUNTER — Inpatient Hospital Stay: Payer: Medicare Other | Attending: Oncology | Admitting: Oncology

## 2021-06-15 ENCOUNTER — Encounter: Payer: Self-pay | Admitting: Oncology

## 2021-06-15 VITALS — BP 96/66 | HR 104 | Temp 97.2°F | Resp 16 | Ht 63.0 in | Wt 144.4 lb

## 2021-06-15 DIAGNOSIS — F17210 Nicotine dependence, cigarettes, uncomplicated: Secondary | ICD-10-CM | POA: Insufficient documentation

## 2021-06-15 DIAGNOSIS — Z7901 Long term (current) use of anticoagulants: Secondary | ICD-10-CM | POA: Diagnosis not present

## 2021-06-15 DIAGNOSIS — F319 Bipolar disorder, unspecified: Secondary | ICD-10-CM | POA: Insufficient documentation

## 2021-06-15 DIAGNOSIS — K625 Hemorrhage of anus and rectum: Secondary | ICD-10-CM | POA: Insufficient documentation

## 2021-06-15 DIAGNOSIS — R9389 Abnormal findings on diagnostic imaging of other specified body structures: Secondary | ICD-10-CM

## 2021-06-15 DIAGNOSIS — I82C19 Acute embolism and thrombosis of unspecified internal jugular vein: Secondary | ICD-10-CM | POA: Diagnosis present

## 2021-06-15 DIAGNOSIS — Z79899 Other long term (current) drug therapy: Secondary | ICD-10-CM | POA: Diagnosis not present

## 2021-06-15 DIAGNOSIS — F1290 Cannabis use, unspecified, uncomplicated: Secondary | ICD-10-CM | POA: Insufficient documentation

## 2021-06-15 DIAGNOSIS — F1490 Cocaine use, unspecified, uncomplicated: Secondary | ICD-10-CM | POA: Diagnosis not present

## 2021-06-15 NOTE — Progress Notes (Signed)
? ? ? ?Hematology/Oncology Consult note ?[facility]  ?Telephone:(336) [phone] Fax:(336) [phone] ?Patient Care Team: ?[facility] as [facility]CP - General (General Practice)  ? ?Name of the patient: Nicole Macdonald  ?[id number]  ?Mar 16, 1970  ? ?Date of visit: 06/15/21 ? ?Diagnosis- internal jugular vein and sigmoid sinus thrombosis ?  ? ?Chief complaint/ Reason for visit-discuss CT scan results and further management ? ?Heme/Onc history:  Patient is a 51 year old female with a past medical history significant for alcohol and cocaine abuse who presented to the ER with symptoms of left-sided numbness and weakness as well as headache facial droop and dysphagia.  She had a CT and MRI brain as well as CT venogram head which showed left sigmoid sinus and internal jugular vein thrombosis.  As per her discharge summary there was no concern for any oropharyngeal infection that would be suggestive of septic thrombophlebitis.  Patient was discharged on Eliquis and asked to follow-up with hematology and neurology.  She had a hypercoagulable work-up done which was essentially unremarkable including antiphospholipid antibody syndrome panel.  Homocystine levels are mildly elevated at 16.8. ?  ?Patient states that since her hospital discharge she has not gone back to using cocaine or alcohol.  Tolerating Eliquis well without any significant side effects.  No family history of thrombosis. ? ?Interval history-reports having occasional bright red blood per rectum.  [facility] found to have [facility].  She remains on Eliquis and takes it consistently. ? ?ECOG PS- 1 ?Pain scale- 0 ? ? ?Review of systems- Review of Systems  ?Constitutional:  Positive for malaise/fatigue. Negative for chills, fever and weight loss.  ?HENT:  Negative for congestion, ear discharge and nosebleeds.   ?Eyes:  Negative for blurred vision.  ?Respiratory:   Negative for cough, hemoptysis, sputum production, shortness of breath and wheezing.   ?Cardiovascular:  Negative for chest pain, palpitations, orthopnea and claudication.  ?Gastrointestinal:  Positive for blood in stool. Negative for abdominal pain, constipation, diarrhea, heartburn, melena, nausea and vomiting.  ?Genitourinary:  Negative for dysuria, flank pain, frequency, hematuria and urgency.  ?Musculoskeletal:  Negative for back pain, joint pain and myalgias.  ?Skin:  Negative for rash.  ?Neurological:  Negative for dizziness, tingling, focal weakness, seizures, weakness and headaches.  ?Endo/Heme/Allergies:  Does not bruise/bleed easily.  ?Psychiatric/Behavioral:  Negative for depression and suicidal ideas. The patient does not have insomnia.    ? ? ? ?Allergies  ?Allergen Reactions  ? Codeine Hives  ? ? ? ?Past Medical History:  ?Diagnosis Date  ? Bipolar 1 disorder (HCC)   ? Internal jugular vein thrombosis (HCC)   ? 2023  ? Manic depression (HCC)   ? ? ? ?Past Surgical History:  ?Procedure Laterality Date  ? COMPRESSION HIP SCREW Left 04/19/2017  ? Procedure: left hip cannulated screw pinning;  Surgeon: Christena FlakePoggi, John J, MD;  Location: [facility] ORS;  Service: Orthopedics;  Laterality: Left;  ? denies    ? ? ?Social History  ? ?Socioeconomic History  ? Marital status: Single  ?  Spouse name: Not on file  ? Number of children: Not on file  ? Years of education: Not on file  ? Highest education level: Not on file  ?Occupational History  ? Not on file  ?Tobacco Use  ? Smoking status: Every Day  ?  Packs/day: 0.50  ?  Types: Cigarettes  ? Smokeless tobacco: Never  ?Vaping Use  ? Vaping  Use: Former  ?Substance and Sexual Activity  ? Alcohol use: Yes  ? Drug use: Yes  ?  Types: Cocaine, Marijuana  ?  Comment: 15 days ago last time cocaine, marijuana complted stop  ? Sexual activity: Not Currently  ?Other Topics Concern  ? Not on file  ?Social History Narrative  ? Not on file  ? ?Social Determinants of Health   ? ?Financial Resource Strain: Not on file  ?Food Insecurity: Not on file  ?Transportation Needs: Not on file  ?Physical Activity: Not on file  ?Stress: Not on file  ?Social Connections: Not on file  ?Intimate Partner Violence: Not on file  ? ? ?Family History  ?Problem Relation Age of Onset  ? Ovarian cancer Mother   ? Diabetes Maternal Grandmother   ? Lung cancer Maternal Grandmother   ? ? ? ?Current Outpatient Medications:  ?  albuterol (VENTOLIN HFA) 108 (90 Base) MCG/ACT inhaler, Inhale 2 puffs into the lungs every 6 (six) hours as needed for wheezing or shortness of breath., Disp: 8 g, Rfl: 0 ?  amphetamine-dextroamphetamine (ADDERALL) 20 MG tablet, Take 20 mg by mouth daily., Disp: , Rfl:  ?  apixaban (ELIQUIS) 5 MG TABS tablet, Take 1 tablet (5 mg total) by mouth 2 (two) times daily., Disp: 60 tablet, Rfl: 0 ?  BIOTIN PO, Take 1 tablet by mouth daily., Disp: , Rfl:  ?  clonazePAM (KLONOPIN) 2 MG tablet, Take 2 mg by mouth 2 (two) times daily., Disp: , Rfl:  ?  FLUoxetine (PROZAC) 40 MG capsule, Take 40 mg by mouth daily., Disp: , Rfl:  ?  fluticasone (FLONASE) 50 MCG/ACT nasal spray, Place 2 sprays into both nostrils daily., Disp: , Rfl:  ?  folic acid (FOLVITE) 1 MG tablet, Take 1 mg by mouth daily., Disp: , Rfl:  ?  rosuvastatin (CRESTOR) 20 MG tablet, Take 1 tablet (20 mg total) by mouth daily., Disp: 30 tablet, Rfl: 0 ?  thiamine 100 MG tablet, Take 100 mg by mouth daily., Disp: , Rfl:  ?  nicotine (NICODERM CQ - DOSED IN MG/24 HOURS) 21 mg/24hr patch, Place 1 patch (21 mg total) onto the skin daily. (Patient not taking: Reported on 06/15/2021), Disp: 28 patch, Rfl: 0 ? ?Physical exam:  ?Vitals:  ? 06/15/21 1449  ?BP: 96/66  ?Pulse: (!) 104  ?Resp: 16  ?Temp: (!) 97.2 ?F (36.2 ?C)  ?TempSrc: Tympanic  ?SpO2: 97%  ?Weight: 144 lb 6.4 oz (65.5 kg)  ?Height: 5\' 3"  (1.6 m)  ? ?Physical Exam ?Cardiovascular:  ?   Rate and Rhythm: Normal rate and regular rhythm.  ?   Heart sounds: Normal heart sounds.   ?Pulmonary:  ?   Effort: Pulmonary effort is normal.  ?   Breath sounds: Normal breath sounds.  ?Skin: ?   General: Skin is warm and dry.  ?Neurological:  ?   Mental Status: She is alert and oriented to person, place, and time.  ?  ? ? ?  Latest Ref Rng & Units 05/11/2021  ?  3:57 AM  ?CMP  ?Glucose 70 - 99 mg/dL 98    ?BUN 6 - 20 mg/dL 8    ?Creatinine 0.44 - 1.00 mg/dL 07/11/2021    ?Sodium 135 - 145 mmol/L 140    ?Potassium 3.5 - 5.1 mmol/L 3.7    ?Chloride 98 - 111 mmol/L 111    ?CO2 22 - 32 mmol/L 25    ?Calcium 8.9 - 10.3 mg/dL 9.0    ?Total Protein  6.5 - 8.1 g/dL 5.4    ?Total Bilirubin 0.3 - 1.2 mg/dL 0.3    ?Alkaline Phos 38 - 126 U/L 45    ?AST 15 - 41 U/L 33    ?ALT 0 - 44 U/L 37    ? ? ?  Latest Ref Rng & Units 05/11/2021  ?  3:57 AM  ?CBC  ?WBC 4.0 - 10.5 K/uL 4.9    ?Hemoglobin 12.0 - 15.0 g/dL 70.1    ?Hematocrit 36.0 - 46.0 % 33.8    ?Platelets 150 - 400 K/uL 155    ? ? ?No images are attached to the encounter. ? ?CT Chest Wo Contrast ? ?Result Date: 06/14/2021 ?CLINICAL DATA:  Acute thrombosis of left internal jugular vein, assess left internal jugular vein. EXAM: CT CHEST WITHOUT CONTRAST TECHNIQUE: Multidetector CT imaging of the chest was performed following the standard protocol without IV contrast. RADIATION DOSE REDUCTION: This exam was performed according to the departmental dose-optimization program which includes automated exposure control, adjustment of the mA and/or kV according to patient size and/or use of iterative reconstruction technique. COMPARISON:  CT venogram head May 09, 2021. FINDINGS: Cardiovascular: Aortic atherosclerosis without thoracic aortic aneurysm. Unremarkable noncontrast appearance of the visualized portions of the left jugular vein. Normal size heart. No significant pericardial effusion/thickening. Mediastinum/Nodes: Hypodense 7 mm nodule in the right lobe of the thyroid. Not clinically significant; no follow-up imaging recommended (ref: J Am Coll Radiol. 2015 Feb;12(2):  143-50).No pathologically enlarged mediastinal, hilar or axillary lymph nodes, noting limited sensitivity for the detection of hilar adenopathy on this noncontrast study. Esophagus is grossly unremarkable. Lungs/Pleura: Mild biapic

## 2021-06-17 ENCOUNTER — Other Ambulatory Visit: Payer: Self-pay | Admitting: *Deleted

## 2021-06-17 DIAGNOSIS — G08 Intracranial and intraspinal phlebitis and thrombophlebitis: Secondary | ICD-10-CM

## 2021-06-18 ENCOUNTER — Telehealth: Payer: Self-pay

## 2021-06-18 NOTE — Telephone Encounter (Signed)
Referral to Neurology for dural venous sinus thrombosis was faxed to [phone] and have received a fax confirmation.  ? ?

## 2021-06-28 ENCOUNTER — Telehealth: Payer: Self-pay

## 2021-06-28 NOTE — Telephone Encounter (Signed)
Contacted pt in regards to neuro referral sent to [facility] and if they have reached out. Pt states they called yesterday and she has an appointment on 07/24/21 at 3:30pm.  

## 2021-06-28 NOTE — Telephone Encounter (Signed)
Contacted pt in regards to neuro referral sent to [facility] and if they have reached out. Pt states they called yesterday and she has an appointment on 07/24/21 at 3:30pm.

## 2021-07-03 ENCOUNTER — Inpatient Hospital Stay: Payer: Self-pay | Admitting: Adult Health

## 2021-07-03 ENCOUNTER — Encounter: Payer: Self-pay | Admitting: Adult Health

## 2021-07-25 ENCOUNTER — Other Ambulatory Visit: Payer: Self-pay | Admitting: Neurology

## 2021-07-25 DIAGNOSIS — G08 Intracranial and intraspinal phlebitis and thrombophlebitis: Secondary | ICD-10-CM

## 2021-08-17 ENCOUNTER — Emergency Department: Payer: Medicare Other

## 2021-08-17 ENCOUNTER — Emergency Department
Admission: EM | Admit: 2021-08-17 | Discharge: 2021-08-18 | Disposition: A | Discharge status: Home / Self Care | Payer: Medicare Other | Attending: Emergency Medicine | Admitting: Emergency Medicine

## 2021-08-17 ENCOUNTER — Encounter: Payer: Self-pay | Admitting: Medical Oncology

## 2021-08-17 DIAGNOSIS — R4182 Altered mental status, unspecified: Secondary | ICD-10-CM | POA: Insufficient documentation

## 2021-08-17 DIAGNOSIS — F419 Anxiety disorder, unspecified: Secondary | ICD-10-CM | POA: Diagnosis not present

## 2021-08-17 DIAGNOSIS — Z7901 Long term (current) use of anticoagulants: Secondary | ICD-10-CM | POA: Diagnosis not present

## 2021-08-17 DIAGNOSIS — Y908 Blood alcohol level of 240 mg/100 ml or more: Secondary | ICD-10-CM | POA: Insufficient documentation

## 2021-08-17 DIAGNOSIS — F10120 Alcohol abuse with intoxication, uncomplicated: Secondary | ICD-10-CM | POA: Diagnosis not present

## 2021-08-17 DIAGNOSIS — R451 Restlessness and agitation: Secondary | ICD-10-CM | POA: Diagnosis present

## 2021-08-17 DIAGNOSIS — F10920 Alcohol use, unspecified with intoxication, uncomplicated: Secondary | ICD-10-CM

## 2021-08-17 LAB — COMPREHENSIVE METABOLIC PANEL
ALT: 25 U/L (ref 0–44)
AST: 30 U/L (ref 15–41)
Albumin: 4.2 g/dL (ref 3.5–5.0)
Alkaline Phosphatase: 60 U/L (ref 38–126)
Anion gap: 7 (ref 5–15)
BUN: 19 mg/dL (ref 6–20)
CO2: 26 mmol/L (ref 22–32)
Calcium: 9.3 mg/dL (ref 8.9–10.3)
Chloride: 113 mmol/L — ABNORMAL HIGH (ref 98–111)
Creatinine, Ser: 0.93 mg/dL (ref 0.44–1.00)
GFR, Estimated: >60 mL/min (ref >60)
Glucose, Bld: 110 mg/dL — ABNORMAL HIGH (ref 70–99)
Potassium: 3.6 mmol/L (ref 3.5–5.1)
Sodium: 146 mmol/L — ABNORMAL HIGH (ref 135–145)
Total Bilirubin: 0.3 mg/dL (ref 0.3–1.2)
Total Protein: 7.2 g/dL (ref 6.5–8.1)

## 2021-08-17 LAB — ETHANOL: Alcohol, Ethyl (B): 367 mg/dL (ref <10)

## 2021-08-17 LAB — CBC
HCT: 38.8 % (ref 36.0–46.0)
Hemoglobin: 12.1 g/dL (ref 12.0–15.0)
MCH: 32.2 pg (ref 26.0–34.0)
MCHC: 31.2 g/dL (ref 30.0–36.0)
MCV: 103.2 fL — ABNORMAL HIGH (ref 80.0–100.0)
Platelets: 226 10*3/uL (ref 150–400)
RBC: 3.76 MIL/uL — ABNORMAL LOW (ref 3.87–5.11)
RDW: 14.4 % (ref 11.5–15.5)
WBC: 5 10*3/uL (ref 4.0–10.5)
nRBC: 0 % (ref 0.0–0.2)

## 2021-08-17 MED ORDER — LORAZEPAM 2 MG PO TABS: 0.0000 mg | ORAL_TABLET | Freq: Two times a day (BID) | ORAL | Status: DC

## 2021-08-17 MED ORDER — LORAZEPAM 2 MG PO TABS: 0.0000 mg | ORAL_TABLET | Freq: Four times a day (QID) | ORAL | Status: DC

## 2021-08-17 MED ORDER — LORAZEPAM 2 MG/ML IJ SOLN: 0.0000 mg | Freq: Four times a day (QID) | INTRAMUSCULAR | Status: DC

## 2021-08-17 MED ORDER — LORAZEPAM 2 MG/ML IJ SOLN: 0.0000 mg | Freq: Two times a day (BID) | INTRAMUSCULAR | Status: DC

## 2021-08-17 MED ORDER — HALOPERIDOL LACTATE 5 MG/ML IJ SOLN
5.0000 mg | Freq: Once | INTRAMUSCULAR | Status: AC
Start: 2021-08-17 — End: 2021-08-17
  Administered 2021-08-17: 5 mg via INTRAMUSCULAR
  Filled 2021-08-17: qty 1

## 2021-08-17 MED ORDER — THIAMINE HCL 100 MG/ML IJ SOLN: 100.0000 mg | Freq: Every day | INTRAMUSCULAR | Status: DC

## 2021-08-17 MED ORDER — THIAMINE HCL 100 MG PO TABS
100.0000 mg | ORAL_TABLET | Freq: Every day | ORAL | Status: DC
  Administered 2021-08-17: 100 mg via ORAL
  Filled 2021-08-17: qty 1

## 2021-08-17 NOTE — ED Notes (Signed)
Pending consult 

## 2021-08-17 NOTE — ED Notes (Signed)
Pt stood up from recliner and laid on floor intentionally.  After some verbal re-direction, patient assisted back into recliner.    EDP at bedside.

## 2021-08-17 NOTE — ED Notes (Signed)
Reita Cliche (pt's brother)  [phone]

## 2021-08-17 NOTE — ED Notes (Signed)
EDP at bedside  

## 2021-08-17 NOTE — ED Triage Notes (Signed)
Pt here via ems from laundry mat with reports that someone called 911 for a lady having bizarre behaviors. Pt arrives to [facility] and is flailing around in her wheelchair, attempts to get up and lays down on the floor. Pt reports that she feels anxious. Pt denies substance abuse.

## 2021-08-17 NOTE — ED Notes (Signed)
Pt constantly asking for her cell phone.  Phone not found in pt's purse.

## 2021-08-17 NOTE — ED Notes (Signed)
IM given without incident.

## 2021-08-17 NOTE — ED Notes (Signed)
Pt stood up and stated she was leaving.  Pt re-directed back to her recliner and told she could not leave at this time.

## 2021-08-17 NOTE — ED Provider Notes (Signed)
[facility] Provider Note    Event Date/Time   First MD Initiated Contact with Patient 08/17/21 1737     (approximate)   History   Psychiatric Evaluation   HPI  Nicole Macdonald is a 51 y.o. female past medical history of cerebral venous sinus thrombosis on Eliquis bipolar disorder, depression alcohol use presents for unclear reasons.  Patient is found agitated at a [employer] where she works.  She initially was saying she was quite anxious.  Patient will really not tell me much says she was drinking earlier but that she drinks every day denies other drug use that she no longer uses cocaine.  Denies suicidality she is wanting her phone but this is not with her currently.  Exam and history are limited by patient's agitation     Past Medical History:  Diagnosis Date   Bipolar 1 disorder ([place])    Internal jugular vein thrombosis ([place])    2023   Manic depression ([place])     Patient Active Problem List   Diagnosis Date Noted   Cerebral venous thrombosis of sigmoid sinus 05/10/2021   Acute thrombosis of left internal jugular vein ([place]) 05/10/2021   Alcohol intoxication ([place]) 05/10/2021   Anxiety and depression 05/10/2021   Tobacco abuse 05/10/2021   Suicidal ideation    Hip fx ([place]) 04/18/2017   Alcohol abuse 09/06/2014   Substance induced mood disorder ([place]) 09/06/2014   Dysthymia 09/06/2014     Physical Exam  Triage Vital Signs: ED Triage Vitals  Enc Vitals Group     BP 08/17/21 1732 117/65     Pulse Rate 08/17/21 1732 (!) 101     Resp 08/17/21 1732 16     Temp 08/17/21 1732 (!) 97.1 F (36.2 C)     Temp Source 08/17/21 1732 Oral     SpO2 08/17/21 1732 96 %     Weight 08/17/21 1733 143 lb 4.8 oz (65 kg)     Height 08/17/21 1733 5\' 3"  (1.6 m)     Head Circumference --      Peak Flow --      Pain Score 08/17/21 1733 0     Pain Loc --      Pain Edu? --      Excl. in [place]? --     Most recent vital signs: Vitals:   08/17/21 1732  08/17/21 2200  BP: 117/65 93/63  Pulse: (!) 101 79  Resp: 16 15  Temp: (!) 97.1 F (36.2 C)   SpO2: 96% 96%     General: Awake, patient is agitated tearful CV:  Good peripheral perfusion.  Resp:  Normal effort.  Abd:  No distention.  Neuro:             Awake, Alert, Oriented x 3  Other:  Aox3, nml speech  PERRL, EOMI, face symmetric, nml tongue movement  + Horizontal nystagmus bilaterally Speech is slurred 5/5 strength in the BL upper and lower extremities  Subjective numbness in the left upper extremity which is baseline for her     ED Results / Procedures / Treatments  Labs (all labs ordered are listed, but only abnormal results are displayed) Labs Reviewed  COMPREHENSIVE METABOLIC PANEL - Abnormal; Notable for the following components:      Result Value   Sodium 146 (*)    Chloride 113 (*)    Glucose, Bld 110 (*)    All other components within normal limits  ETHANOL - Abnormal; Notable for  the following components:   Alcohol, Ethyl (B) 367 (*)    All other components within normal limits  CBC - Abnormal; Notable for the following components:   RBC 3.76 (*)    MCV 103.2 (*)    All other components within normal limits  URINE DRUG SCREEN, QUALITATIVE ([facility] ONLY)  POC URINE PREG, ED  POC URINE PREG, ED     EKG     RADIOLOGY I reviewed and interpreted the CT scan of the brain which does not show any acute intracranial process    PROCEDURES:  Critical Care performed: No  Procedures  The patient is on the cardiac monitor to evaluate for evidence of arrhythmia and/or significant heart rate changes.   MEDICATIONS ORDERED IN ED: Medications  LORazepam (ATIVAN) injection 0-4 mg ( Intravenous See Alternative 08/17/21 2040)    Or  LORazepam (ATIVAN) tablet 0-4 mg ( Oral Not Given 08/17/21 2040)  LORazepam (ATIVAN) injection 0-4 mg (has no administration in time range)    Or  LORazepam (ATIVAN) tablet 0-4 mg (has no administration in time range)   thiamine tablet 100 mg (100 mg Oral Given 08/17/21 2155)    Or  thiamine (B-1) injection 100 mg ( Intravenous See Alternative 08/17/21 2155)  haloperidol lactate (HALDOL) injection 5 mg (5 mg Intramuscular Given 08/17/21 1846)     IMPRESSION / MDM / ASSESSMENT AND PLAN / ED COURSE  I reviewed the triage vital signs and the nursing notes.                              Patient's presentation is most consistent with acute presentation with potential threat to life or bodily function.  Differential diagnosis includes, but is not limited to, intoxication with alcohol, manic episode, anxiety, intoxication with sympathomimetic or other substance, meningitis/encephalitis  The patient is a 51 year old female presenting with agitation/anxiety.  She was found at her place of work quite agitated.  Told triage she was anxious my assessment somewhat limited as she really will not tell me much she is requesting her phone crying wanting to speak with her boss says she just wants to see his face 1 more time.  Patient has an at the level over 300 I suspect is driving her presentation.  Given she is on Eliquis with recent dural venous sinus thrombosis I did obtain a CT head which is negative for injury.  Patient did attempt to walk out she was able to be verbally de-escalated and was agreeable to IM Haldol after which she slept for some time.  She was placed on CIWA.  On reassessment after about 6 hours of observation patient is more sober appearing tells me she was feeling anxious today but no specific trigger she denies suicidality.  Tells me she is seeing a psychiatrist who she has been seeing for some time does not want to speak to psych today and I think that this is reasonable given she is not suicidal I do not think she is a threat to herself.  Patient does not have a ride home currently do not feel that she is safe enough to walk home will continue to observe until she can be safely discharged.  This plan was  signed out to the oncoming provider.         FINAL CLINICAL IMPRESSION(S) / ED DIAGNOSES   Final diagnoses:  Anxiety  Alcoholic intoxication without complication (HCC)     Rx / DC  Orders   ED Discharge Orders     None        Note:  This document was prepared using Dragon voice recognition software and may include unintentional dictation errors.   Georga Hacking, MD 08/17/21 [phone]

## 2021-08-17 NOTE — ED Notes (Signed)
Pt dressed out by RN and security:  Jeans T shirt Underwear Bra Sneakers Purple purse  NO cell phone brought to hopsital

## 2021-08-18 NOTE — ED Notes (Signed)
Patient unable to obtain a ride at this time.

## 2021-08-18 NOTE — ED Provider Notes (Signed)
Patient received in signout from Dr. Sidney Ace pending reassessment and safe discharge planning.  Patient was evaluated for acute anxiety, but refused psychiatric evaluation.  Indications for IVC.  She was placed on CIWA, but did not require any benzodiazepine administration.  She has been observed for 8-1/2 hours.  I reassessed the patient and she reports feeling much better and is appreciative for the care provided.  She reports desire to go home, but is uncertain how she will go home.  We discussed safe return precautions for the ED.  Discussed that she may wait for a ride here, may wait in the waiting room or call for a ride share.  She suitable for outpatient management.   Delton [place], MD 08/18/21 [phone]

## 2021-08-18 NOTE — ED Notes (Signed)
Pt is A/O x3 this a.m.  Pt denies all SI/HI stated she does not have any A/V hallucinations. All belongings returned to pt and accounted for. Pt left ambulatory via POV.

## 2021-09-02 ENCOUNTER — Other Ambulatory Visit: Payer: Self-pay

## 2021-09-03 ENCOUNTER — Other Ambulatory Visit: Payer: Self-pay

## 2021-09-04 ENCOUNTER — Other Ambulatory Visit: Payer: Self-pay

## 2021-09-10 ENCOUNTER — Encounter: Payer: Self-pay | Admitting: Adult Health

## 2021-09-10 ENCOUNTER — Ambulatory Visit (INDEPENDENT_AMBULATORY_CARE_PROVIDER_SITE_OTHER): Payer: Medicare Other | Admitting: Adult Health

## 2021-09-10 VITALS — BP 98/58 | Ht 63.0 in | Wt 143.8 lb

## 2021-09-10 DIAGNOSIS — I82C12 Acute embolism and thrombosis of left internal jugular vein: Secondary | ICD-10-CM

## 2021-09-10 DIAGNOSIS — G459 Transient cerebral ischemic attack, unspecified: Secondary | ICD-10-CM

## 2021-09-10 NOTE — Progress Notes (Signed)
PATIENT: Nicole Macdonald DOB: [birth year]  REASON FOR VISIT: follow up HISTORY FROM: patient PRIMARY NEUROLOGIST: Dr. Leonie Man  Chief Complaint  Patient presents with   Follow-up    Pt in 18  pt here for hospital f/u for TIA pt states no questions or concerns this am      HISTORY OF PRESENT ILLNESS: Today 09/10/21 Nicole Macdonald is a 51 year old female with a history of TIA event.  She returns today for follow-up.  She reports No additional strokelike symptoms.  Reports that she was told to take crestor for 1 month- has not followed with PCP since stroke for additional blood work.  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  Hypercoagulable workup negative she will continue to follow hematology who is doing blood work in 3 to 6 months.  HISTORY ( copied from Dr. Clydene Fake note): Nicole Macdonald is a 51 y.o. female with history of bipolar disorder, manic depression, tobacco use and substance abuse presenting via EMS as a Code Stroke for new onset of left sided weakness and garbled speech. She did not receive TNK.   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  Interestingly cocaine induced cerebral venous sinus thrombosis has been infrequently described in the literature as well. Resultant left hemiparesis and sensory loss Code Stroke CT Head - No acute intracranial process. ASPECTS is 10. Bubbly fluid in the left sphenoid sinus, as can be seen in the setting of acute sinusitis. Correlate with symptoms. CT Venogram Head - Left sigmoid sinus and internal jugular vein thrombosis. MRI head - Incomplete, motion degraded examination. No acute infarct. Abnormal appearance of the left sigmoid sinus  corresponding to suspected thrombosis on today's CTA. MRA head - not ordered CTA H&N - Filling defect in the left sigmoid sinus, concerning for venous sinus thrombosis. Consider CTV. No intracranial large vessel occlusion or significant stenosis. No hemodynamically significant stenosis in the neck. No infarct core or penumbra on CT perfusion imaging. CT Perfusion - No infarct core or penumbra on CT perfusion imaging. Carotid Doppler - CTA neck ordered - carotid dopplers not indicated. 2D Echo -ejection fraction 55 to 60%.   Sars Corona Virus 2 - negative ETH - 239 (H)  LDL - 98 HgbA1c - 4.9 UDS - positive for cocaine  REVIEW OF SYSTEMS: Out of a complete 14 system review of symptoms, the patient complains only of the following symptoms, and all other reviewed systems are negative.  ALLERGIES: Allergies  Allergen Reactions   Codeine Hives    HOME MEDICATIONS: Outpatient Medications Prior to Visit  Medication Sig Dispense Refill   albuterol (VENTOLIN HFA) 108 (90 Base) MCG/ACT inhaler Inhale 2 puffs into the lungs every 6 (six) hours as needed for wheezing or shortness of breath. 8 g 0   amphetamine-dextroamphetamine (ADDERALL) 20 MG tablet Take 20 mg by mouth daily.     apixaban (ELIQUIS) 5 MG TABS tablet Take 1 tablet (5 mg total) by mouth 2 (two) times daily. 60 tablet 0   BIOTIN PO Take 1 tablet by mouth daily.     clonazePAM (KLONOPIN) 2 MG tablet Take 2 mg by mouth 2 (two) times daily.     FLUoxetine (PROZAC) 40 MG capsule Take 40 mg by mouth daily.  fluticasone (FLONASE) 50 MCG/ACT nasal spray Place 2 sprays into both nostrils daily.     folic acid (FOLVITE) 1 MG tablet Take 1 mg by mouth daily.     nicotine (NICODERM CQ - DOSED IN MG/24 HOURS) 21 mg/24hr patch Place 1 patch (21 mg total) onto the skin daily. (Patient not taking: Reported on 06/15/2021) 28 patch 0   rosuvastatin (CRESTOR) 20 MG tablet Take 1 tablet (20 mg total) by mouth daily. 30 tablet 0   thiamine 100 MG  tablet Take 100 mg by mouth daily.     vitamin B-12 (CYANOCOBALAMIN) 1000 MCG tablet Take 1,000 mcg by mouth daily.     No facility-administered medications prior to visit.    PAST MEDICAL HISTORY: Past Medical History:  Diagnosis Date   Bipolar 1 disorder (HCC)    Internal jugular vein thrombosis (HCC)    2023   Manic depression (HCC)     PAST SURGICAL HISTORY: Past Surgical History:  Procedure Laterality Date   COMPRESSION HIP SCREW Left 04/19/2017   Procedure: left hip cannulated screw pinning;  Surgeon: Christena Flake, MD;  Location: [facility] ORS;  Service: Orthopedics;  Laterality: Left;   denies      FAMILY HISTORY: Family History  Problem Relation Age of Onset   Ovarian cancer Mother    Diabetes Maternal Grandmother    Lung cancer Maternal Grandmother     SOCIAL HISTORY: Social History   Socioeconomic History   Marital status: Single    Spouse name: Not on file   Number of children: Not on file   Years of education: Not on file   Highest education level: Not on file  Occupational History   Not on file  Tobacco Use   Smoking status: Every Day    Packs/day: 0.50    Types: Cigarettes   Smokeless tobacco: Never  Vaping Use   Vaping Use: Former  Substance and Sexual Activity   Alcohol use: Yes   Drug use: Yes    Types: Cocaine, Marijuana    Comment: 15 days ago last time cocaine, marijuana complted stop   Sexual activity: Not Currently  Other Topics Concern   Not on file  Social History Narrative   Not on file   Social Determinants of Health   Financial Resource Strain: Not on file  Food Insecurity: Not on file  Transportation Needs: Not on file  Physical Activity: Not on file  Stress: Not on file  Social Connections: Not on file  Intimate Partner Violence: Not on file      PHYSICAL EXAM  Vitals:   09/10/21 0906  BP: (!) 98/58  Weight: 143 lb 12.8 oz (65.2 kg)  Height: 5\' 3"  (1.6 m)   Body mass index is 25.47 kg/m.  Generalized: Well  developed, in no acute distress   Neurological examination  Mentation: Alert oriented to time, place, history taking. Follows all commands speech and language fluent Cranial nerve II-XII: Pupils were equal round reactive to light. Extraocular movements were full, visual field were full on confrontational test. Facial sensation and strength were normal. Head turning and shoulder shrug  were normal and symmetric. Motor: The motor testing reveals 5 over 5 strength of all 4 extremities. Good symmetric motor tone is noted throughout.  Sensory: Sensory testing is intact to soft touch on all 4 extremities. No evidence of extinction is noted.  Coordination: Cerebellar testing reveals good finger-nose-finger and heel-to-shin bilaterally.  Gait and station: Slight limp on the right due to exercise  injury from swimming. tandem gait is unsteady.  Romberg is negative but unsteady   DIAGNOSTIC DATA (LABS, IMAGING, TESTING) - I reviewed patient records, labs, notes, testing and imaging myself where available.  Lab Results  Component Value Date   WBC 5.0 08/17/2021   HGB 12.1 08/17/2021   HCT 38.8 08/17/2021   MCV 103.2 (H) 08/17/2021   PLT 226 08/17/2021      Component Value Date/Time   NA 146 (H) 08/17/2021 1750   NA 137 07/28/2013 1648   K 3.6 08/17/2021 1750   K 3.8 07/28/2013 1648   CL 113 (H) 08/17/2021 1750   CL 107 07/28/2013 1648   CO2 26 08/17/2021 1750   CO2 25 07/28/2013 1648   GLUCOSE 110 (H) 08/17/2021 1750   GLUCOSE 96 07/28/2013 1648   BUN 19 08/17/2021 1750   BUN 15 07/28/2013 1648   CREATININE 0.93 08/17/2021 1750   CREATININE 0.91 07/28/2013 1648   CALCIUM 9.3 08/17/2021 1750   CALCIUM 8.9 07/28/2013 1648   PROT 7.2 08/17/2021 1750   ALBUMIN 4.2 08/17/2021 1750   AST 30 08/17/2021 1750   ALT 25 08/17/2021 1750   ALKPHOS 60 08/17/2021 1750   BILITOT 0.3 08/17/2021 1750   GFRNONAA >60 08/17/2021 1750   GFRNONAA >60 07/28/2013 1648   GFRAA >60 11/05/2019 0125   GFRAA  >60 07/28/2013 1648   Lab Results  Component Value Date   CHOL 174 05/10/2021   HDL 66 05/10/2021   LDLCALC 98 05/10/2021   TRIG 51 05/10/2021   CHOLHDL 2.6 05/10/2021   Lab Results  Component Value Date   HGBA1C 4.9 05/10/2021   Lab Results  Component Value Date   VITAMINB12 465 05/30/2021   Lab Results  Component Value Date   TSH 1.850 05/30/2021      ASSESSMENT AND PLAN 51 y.o. year old female  has a past medical history of Bipolar 1 disorder (HCC), Internal jugular vein thrombosis (HCC), and Manic depression (HCC). 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  Discussed secondary stroke prevention measures and importance of close PCP follow up for aggressive stroke risk factor management. I have gone over the pathophysiology of stroke, warning signs and symptoms, risk factors and their management in some detail with instructions to go to the closest emergency room for symptoms of concern. HTN: BP goal normotensive.   HLD: LDL goal <70. Recent LDL 98. Encouraged patient to follow-up with PCP to recheck lipids. Would check today but she is not fasting DMII: A1c goal<7.0. Recent A1c 4.9.  Encouraged patient to monitor diet and encouraged exercise Discussed smoking sensation-reports that she was willing to use nicotine patches but the hospital never sent the order in.  I did advise that she could buy these over-the-counter. FU with our office PRN      Butch Penny, MSN, NP-C 09/10/2021, 7:36 AM [facility] [address] [place], [place] [address] [phone]

## 2021-09-11 NOTE — Progress Notes (Signed)
I agree with the above plan 

## 2021-09-14 ENCOUNTER — Inpatient Hospital Stay: Payer: Medicare Other | Attending: Oncology

## 2021-09-20 ENCOUNTER — Emergency Department: Payer: Medicare Other

## 2021-09-20 ENCOUNTER — Other Ambulatory Visit: Payer: Self-pay

## 2021-09-20 ENCOUNTER — Inpatient Hospital Stay: Payer: Medicare Other

## 2021-09-20 ENCOUNTER — Inpatient Hospital Stay
Admission: EM | Admit: 2021-09-20 | Discharge: 2021-09-24 | DRG: 896 | Disposition: A | Discharge status: Home / Self Care | Payer: Medicare Other | Attending: Internal Medicine | Admitting: Internal Medicine

## 2021-09-20 DIAGNOSIS — F10929 Alcohol use, unspecified with intoxication, unspecified: Principal | ICD-10-CM

## 2021-09-20 DIAGNOSIS — W19XXXA Unspecified fall, initial encounter: Secondary | ICD-10-CM | POA: Diagnosis present

## 2021-09-20 DIAGNOSIS — J9601 Acute respiratory failure with hypoxia: Secondary | ICD-10-CM

## 2021-09-20 DIAGNOSIS — F1020 Alcohol dependence, uncomplicated: Secondary | ICD-10-CM | POA: Diagnosis present

## 2021-09-20 DIAGNOSIS — G9341 Metabolic encephalopathy: Secondary | ICD-10-CM

## 2021-09-20 DIAGNOSIS — E876 Hypokalemia: Secondary | ICD-10-CM

## 2021-09-20 DIAGNOSIS — J9600 Acute respiratory failure, unspecified whether with hypoxia or hypercapnia: Secondary | ICD-10-CM

## 2021-09-20 DIAGNOSIS — Z79899 Other long term (current) drug therapy: Secondary | ICD-10-CM | POA: Diagnosis not present

## 2021-09-20 DIAGNOSIS — D649 Anemia, unspecified: Secondary | ICD-10-CM | POA: Diagnosis present

## 2021-09-20 DIAGNOSIS — F10229 Alcohol dependence with intoxication, unspecified: Principal | ICD-10-CM | POA: Diagnosis present

## 2021-09-20 DIAGNOSIS — Z720 Tobacco use: Secondary | ICD-10-CM

## 2021-09-20 DIAGNOSIS — F10239 Alcohol dependence with withdrawal, unspecified: Secondary | ICD-10-CM | POA: Diagnosis not present

## 2021-09-20 DIAGNOSIS — Z7951 Long term (current) use of inhaled steroids: Secondary | ICD-10-CM

## 2021-09-20 DIAGNOSIS — Y908 Blood alcohol level of 240 mg/100 ml or more: Secondary | ICD-10-CM | POA: Diagnosis present

## 2021-09-20 DIAGNOSIS — Z7141 Alcohol abuse counseling and surveillance of alcoholic: Secondary | ICD-10-CM

## 2021-09-20 DIAGNOSIS — Z8673 Personal history of transient ischemic attack (TIA), and cerebral infarction without residual deficits: Secondary | ICD-10-CM | POA: Diagnosis not present

## 2021-09-20 DIAGNOSIS — J9602 Acute respiratory failure with hypercapnia: Secondary | ICD-10-CM | POA: Diagnosis present

## 2021-09-20 DIAGNOSIS — R579 Shock, unspecified: Secondary | ICD-10-CM | POA: Diagnosis not present

## 2021-09-20 DIAGNOSIS — S0990XA Unspecified injury of head, initial encounter: Secondary | ICD-10-CM | POA: Diagnosis present

## 2021-09-20 DIAGNOSIS — F319 Bipolar disorder, unspecified: Secondary | ICD-10-CM | POA: Diagnosis present

## 2021-09-20 DIAGNOSIS — F1910 Other psychoactive substance abuse, uncomplicated: Secondary | ICD-10-CM | POA: Diagnosis present

## 2021-09-20 HISTORY — DX: Bipolar disorder, unspecified: F31.9

## 2021-09-20 HISTORY — DX: Transient cerebral ischemic attack, unspecified: G45.9

## 2021-09-20 HISTORY — DX: Acute embolism and thrombosis of unspecified vein: I82.90

## 2021-09-20 LAB — URINE DRUG SCREEN, QUALITATIVE (ARMC ONLY)
Amphetamines, Ur Screen: POSITIVE — AB
Barbiturates, Ur Screen: NOT DETECTED
Benzodiazepine, Ur Scrn: POSITIVE — AB
Cannabinoid 50 Ng, Ur ~~LOC~~: NOT DETECTED
Cocaine Metabolite,Ur ~~LOC~~: NOT DETECTED
MDMA (Ecstasy)Ur Screen: NOT DETECTED
Methadone Scn, Ur: NOT DETECTED
Opiate, Ur Screen: NOT DETECTED
Phencyclidine (PCP) Ur S: NOT DETECTED
Tricyclic, Ur Screen: NOT DETECTED

## 2021-09-20 LAB — COMPREHENSIVE METABOLIC PANEL
ALT: 32 U/L (ref 0–44)
AST: 38 U/L (ref 15–41)
Albumin: 4 g/dL (ref 3.5–5.0)
Alkaline Phosphatase: 59 U/L (ref 38–126)
Anion gap: 9 (ref 5–15)
BUN: 9 mg/dL (ref 6–20)
CO2: 25 mmol/L (ref 22–32)
Calcium: 8.4 mg/dL — ABNORMAL LOW (ref 8.9–10.3)
Chloride: 112 mmol/L — ABNORMAL HIGH (ref 98–111)
Creatinine, Ser: 0.66 mg/dL (ref 0.44–1.00)
GFR, Estimated: >60 mL/min (ref >60)
Glucose, Bld: 93 mg/dL (ref 70–99)
Potassium: 3.7 mmol/L (ref 3.5–5.1)
Sodium: 146 mmol/L — ABNORMAL HIGH (ref 135–145)
Total Bilirubin: 0.4 mg/dL (ref 0.3–1.2)
Total Protein: 7.1 g/dL (ref 6.5–8.1)

## 2021-09-20 LAB — CREATININE, SERUM
Creatinine, Ser: 0.59 mg/dL (ref 0.44–1.00)
GFR, Estimated: >60 mL/min (ref >60)

## 2021-09-20 LAB — LACTIC ACID, PLASMA
Lactic Acid, Venous: 2.4 mmol/L (ref 0.5–1.9)
Lactic Acid, Venous: 2.4 mmol/L (ref 0.5–1.9)

## 2021-09-20 LAB — ETHANOL: Alcohol, Ethyl (B): 391 mg/dL (ref <10)

## 2021-09-20 LAB — BLOOD GAS, ARTERIAL
Acid-base deficit: 2.2 mmol/L — ABNORMAL HIGH (ref 0.0–2.0)
Bicarbonate: 22.4 mmol/L (ref 20.0–28.0)
FIO2: 40 %
MECHVT: 450 mL
Mechanical Rate: 16
O2 Saturation: 99.7 %
PEEP: 5 cmH2O
Patient temperature: 37
pCO2 arterial: 37 mmHg (ref 32–48)
pH, Arterial: 7.39 (ref 7.35–7.45)
pO2, Arterial: 111 mmHg — ABNORMAL HIGH (ref 83–108)

## 2021-09-20 LAB — CBC
HCT: 38.8 % (ref 36.0–46.0)
HCT: 30.8 % — ABNORMAL LOW (ref 36.0–46.0)
Hemoglobin: 12.4 g/dL (ref 12.0–15.0)
Hemoglobin: 10 g/dL — ABNORMAL LOW (ref 12.0–15.0)
MCH: 33 pg (ref 26.0–34.0)
MCH: 32.9 pg (ref 26.0–34.0)
MCHC: 32 g/dL (ref 30.0–36.0)
MCHC: 32.5 g/dL (ref 30.0–36.0)
MCV: 103.2 fL — ABNORMAL HIGH (ref 80.0–100.0)
MCV: 101.3 fL — ABNORMAL HIGH (ref 80.0–100.0)
Platelets: 233 10*3/uL (ref 150–400)
Platelets: 187 10*3/uL (ref 150–400)
RBC: 3.76 MIL/uL — ABNORMAL LOW (ref 3.87–5.11)
RBC: 3.04 MIL/uL — ABNORMAL LOW (ref 3.87–5.11)
RDW: 13.7 % (ref 11.5–15.5)
RDW: 13.6 % (ref 11.5–15.5)
WBC: 4.8 10*3/uL (ref 4.0–10.5)
WBC: 2.9 10*3/uL — ABNORMAL LOW (ref 4.0–10.5)
nRBC: 0 % (ref 0.0–0.2)
nRBC: 0 % (ref 0.0–0.2)

## 2021-09-20 LAB — SALICYLATE LEVEL: Salicylate Lvl: <7 mg/dL — ABNORMAL LOW (ref 7.0–30.0)

## 2021-09-20 LAB — TROPONIN I (HIGH SENSITIVITY)
Troponin I (High Sensitivity): 4 ng/L (ref <18)
Troponin I (High Sensitivity): 3 ng/L (ref <18)

## 2021-09-20 LAB — ACETAMINOPHEN LEVEL: Acetaminophen (Tylenol), Serum: <10 ug/mL — ABNORMAL LOW (ref 10–30)

## 2021-09-20 LAB — MRSA NEXT GEN BY PCR, NASAL: MRSA by PCR Next Gen: NOT DETECTED

## 2021-09-20 LAB — PREGNANCY, URINE: Preg Test, Ur: NEGATIVE

## 2021-09-20 MED ORDER — POLYETHYLENE GLYCOL 3350 17 G PO PACK: 17.0000 g | PACK | Freq: Every day | ORAL | Status: DC | PRN

## 2021-09-20 MED ORDER — CHLORHEXIDINE GLUCONATE CLOTH 2 % EX PADS
6.0000 | MEDICATED_PAD | Freq: Every day | CUTANEOUS | Status: DC
  Administered 2021-09-20 – 2021-09-24 (×4): 6 via TOPICAL

## 2021-09-20 MED ORDER — ORAL CARE MOUTH RINSE: 15.0000 mL | OROMUCOSAL | Status: DC | PRN

## 2021-09-20 MED ORDER — PANTOPRAZOLE 2 MG/ML SUSPENSION
40.0000 mg | Freq: Every day | ORAL | Status: DC
  Administered 2021-09-20: 40 mg
  Filled 2021-09-20 (×2): qty 20

## 2021-09-20 MED ORDER — PROPOFOL 1000 MG/100ML IV EMUL
INTRAVENOUS | Status: AC
  Filled 2021-09-20: qty 100

## 2021-09-20 MED ORDER — FENTANYL 2500MCG IN NS 250ML (10MCG/ML) PREMIX INFUSION
0.0000 ug/h | INTRAVENOUS | Status: DC
  Administered 2021-09-20: 100 ug/h via INTRAVENOUS
  Filled 2021-09-20: qty 250

## 2021-09-20 MED ORDER — DOCUSATE SODIUM 100 MG PO CAPS: 100.0000 mg | ORAL_CAPSULE | Freq: Two times a day (BID) | ORAL | Status: DC | PRN

## 2021-09-20 MED ORDER — SODIUM CHLORIDE 0.9 % IV BOLUS
1000.0000 mL | Freq: Once | INTRAVENOUS | Status: AC
  Administered 2021-09-20: 1000 mL via INTRAVENOUS

## 2021-09-20 MED ORDER — ETOMIDATE 2 MG/ML IV SOLN
INTRAVENOUS | Status: AC | PRN
  Administered 2021-09-20: 20 mg via INTRAVENOUS

## 2021-09-20 MED ORDER — ORAL CARE MOUTH RINSE
15.0000 mL | OROMUCOSAL | Status: DC
  Administered 2021-09-20 – 2021-09-21 (×7): 15 mL via OROMUCOSAL

## 2021-09-20 MED ORDER — PROPOFOL 1000 MG/100ML IV EMUL
INTRAVENOUS | Status: AC
  Administered 2021-09-20: 30 ug via INTRAVENOUS
  Filled 2021-09-20: qty 100

## 2021-09-20 MED ORDER — HEPARIN SODIUM (PORCINE) 5000 UNIT/ML IJ SOLN
5000.0000 [IU] | Freq: Three times a day (TID) | INTRAMUSCULAR | Status: DC
  Administered 2021-09-20 – 2021-09-21 (×2): 5000 [IU] via SUBCUTANEOUS
  Filled 2021-09-20 (×2): qty 1

## 2021-09-20 MED ORDER — DOCUSATE SODIUM 50 MG/5ML PO LIQD: 100.0000 mg | Freq: Two times a day (BID) | ORAL | Status: DC | PRN

## 2021-09-20 MED ORDER — PROPOFOL 1000 MG/100ML IV EMUL
INTRAVENOUS | Status: AC | PRN
  Administered 2021-09-20: 20 ug/kg/min via INTRAVENOUS
  Administered 2021-09-20: 30 ug via INTRAVENOUS

## 2021-09-20 MED ORDER — SUCCINYLCHOLINE CHLORIDE 20 MG/ML IJ SOLN
INTRAMUSCULAR | Status: AC | PRN
  Administered 2021-09-20: 100 mg via INTRAVENOUS

## 2021-09-20 NOTE — H&P (Addendum)
NAME:  Nicole Macdonald, MRN:  [id number], DOB:  July 09, 1970, LOS: 0 ADMISSION DATE:  09/20/2021, CONSULTATION DATE: 09/20/2021 REFERRING MD: Asencion Partridge MD, CHIEF COMPLAINT: Fall, alcohol intoxication  History of Present Illness:  51 y.o. 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  Per EMS she went to friend's house already intoxicated.)  Hold portable catheter and then fell and hit the head outside of the pool.  She was alternatively apneic and agitated and intubated in the ED for airway protection.  CT head and chest x-ray labs obtained and PCCM consulted for admission.  Pertinent  Medical History   No past medical history on file.   Significant Hospital Events: Including procedures, antibiotic start and stop dates in addition to other pertinent events   8/17 Admit  Interim History / Subjective:    Objective   Blood pressure 90/65, pulse 72, temperature (!) 96.1 F (35.6 C), resp. rate (!) 21, weight 60 kg, SpO2 97 %.    Vent Mode: AC FiO2 (%):  [40 %-60 %] 40 % Set Rate:  [16 bmp] 16 bmp Vt Set:  [450 mL] 450 mL PEEP:  [5 cmH20] 5 cmH20  No intake or output data in the 24 hours ending 09/20/21 1949 Filed Weights   09/20/21 1756  Weight: 60 kg    Examination: Blood pressure 93/69, pulse 68, temperature (!) 95.7 F (35.4 C), resp. rate 18, weight 60 kg, SpO2 98 %. Gen:      No acute distress HEENT:  EOMI, sclera anicteric Neck:     No masses; no thyromegaly, ETT Lungs:    Clear to auscultation bilaterally; normal respiratory effort CV:         Regular rate and rhythm; no murmurs Abd:      + bowel sounds; soft, non-tender; no palpable masses, no distension Ext:    No edema; adequate peripheral perfusion Skin:      Warm and dry; no rash Neuro: alert and  oriented x 3 Psych: normal mood and affect   Labs/imaging reviewed Significant for sodium 146, BUN/creatinine 9/0.66 WBC 4.8, hemoglobin 12.4, platelets 233 CT head with no acute abnormality Chest x-ray shows retrocardiac opacity, subtle lucency in the right lung apex.  Resolved Hospital Problem list     Assessment & Plan:  Acute respiratory failure, inability to protect airway due to intoxication Intubated for respiratory protection Check ABG.  Repeat chest x-ray to evaluate skinfold/pneumothorax though suspicion for the latter is low She may have retrocardiac opacity possibly atelectasis.  Observe off antibiotics Spontaneous breathing trials when mental status improves  Acute encephalopathy secondary to alcohol intoxication CT head is unremarkable Monitor neuro status  Best Practice (right click and "Reselect all SmartList Selections" daily)   Diet/type: NPO DVT prophylaxis: prophylactic heparin  GI prophylaxis: PPI Lines: N/A Foley:  N/A Code Status:  full code Last date of multidisciplinary goals of care discussion [=].  Sister Waynetta Sandy was called and updated over telephone.  Critical care time:    The patient is critically ill with multiple organ system failure and requires high complexity decision making for assessment and support, frequent evaluation and titration of therapies, advanced monitoring, review of radiographic studies and interpretation of complex data.   Critical Care Time devoted to patient care services, exclusive of separately billable procedures, described  in this note is 35 minutes.   Chilton Greathouse MD [facility] See Amion for pager  If no response to pager , please call [phone] until 7pm After 7:00 pm call Elink  [phone] 09/20/2021, 8:07 PM

## 2021-09-20 NOTE — ED Provider Notes (Signed)
[facility] Provider Note    Event Date/Time   First MD Initiated Contact with Patient 09/20/21 1755     (approximate)   History   Alcohol Intoxication and Fall   HPI  Nicole Macdonald is a 51 y.o. female with a history of cerebral venous sinus thrombosis on Eliquis bipolar disorder, depression, and alcohol abuse who presents with alcohol intoxication, altered mental status, and head injury.  Per EMS the patient went to a friend's house and when she arrived there she was already intoxicated.  She proceeded to drink a whole bottle of liquor while there and then fell and hit her head outside of the pool.  When EMS arrived the patient was alternately agitated and apneic and received 2 mg of Versed.  The patient is unable to give any history.   Physical Exam   Triage Vital Signs: ED Triage Vitals  Enc Vitals Group     BP 09/20/21 1746 121/82     Pulse Rate 09/20/21 1746 (!) 102     Resp 09/20/21 1746 (!) 24     Temp --      Temp src --      SpO2 09/20/21 1746 94 %     Weight --      Height --      Head Circumference --      Peak Flow --      Pain Score 09/20/21 1747 0     Pain Loc --      Pain Edu? --      Excl. in [place]? --     Most recent vital signs: Vitals:   09/20/21 2015 09/20/21 2020  BP: 98/73 99/71  Pulse: 66 67  Resp: (!) 21 (!) 21  Temp: (!) 95.5 F (35.3 C) (!) 95.5 F (35.3 C)  SpO2: 100% 100%     General: Are minimally agitated then minimally responsive with poor respiratory effort. CV:  Good peripheral perfusion.  Resp:  Intermittently poor respiratory effort.  Decreased breath sounds bilaterally. Abd:  No distention.  Other:  EOMI.  PERRLA.  Motor intact in all extremities.  Superficial occipital head laceration.    ED Results / Procedures / Treatments   Labs (all labs ordered are listed, but only abnormal results are displayed) Labs Reviewed  COMPREHENSIVE METABOLIC PANEL - Abnormal; Notable for the following  components:      Result Value   Sodium 146 (*)    Chloride 112 (*)    Calcium 8.4 (*)    All other components within normal limits  CBC - Abnormal; Notable for the following components:   RBC 3.76 (*)    MCV 103.2 (*)    All other components within normal limits  ACETAMINOPHEN LEVEL - Abnormal; Notable for the following components:   Acetaminophen (Tylenol), Serum <10 (*)    All other components within normal limits  SALICYLATE LEVEL - Abnormal; Notable for the following components:   Salicylate Lvl [id number] (*)    All other components within normal limits  LACTIC ACID, PLASMA - Abnormal; Notable for the following components:   Lactic Acid, Venous 2.4 (*)    All other components within normal limits  ETHANOL - Abnormal; Notable for the following components:   Alcohol, Ethyl (B) 391 (*)    All other components within normal limits  URINE DRUG SCREEN, QUALITATIVE ([facility] ONLY) - Abnormal; Notable for the following components:   Amphetamines, Ur Screen POSITIVE (*)    Benzodiazepine, Ur Scrn POSITIVE (*)  All other components within normal limits  BLOOD GAS, ARTERIAL - Abnormal; Notable for the following components:   pO2, Arterial 111 (*)    Acid-base deficit 2.2 (*)    All other components within normal limits  LACTIC ACID, PLASMA  PREGNANCY, URINE  HIV ANTIBODY (ROUTINE TESTING W REFLEX)  CBC  CREATININE, SERUM  CBC  BASIC METABOLIC PANEL  CBG MONITORING, ED  TROPONIN I (HIGH SENSITIVITY)  TROPONIN I (HIGH SENSITIVITY)     EKG  ED ECG REPORT I, Dionne Bucy, the attending physician, personally viewed and interpreted this ECG.  Date: 09/20/2021 EKG Time: 1803 Rate: 109 Rhythm: Sinus tachycardia QRS Axis: normal Intervals: normal ST/T Wave abnormalities: normal Narrative Interpretation: no evidence of acute ischemia   RADIOLOGY  Chest x-ray: I independently viewed and interpreted the images; there is no focal consolidation or edema  CT head: No  ICH    PROCEDURES:  Critical Care performed: Yes, see critical care procedure note(s)  .Critical Care  Performed by: Dionne Bucy, MD Authorized by: Dionne Bucy, MD   Critical care provider statement:    Critical care time (minutes):  60   Critical care was necessary to treat or prevent imminent or life-threatening deterioration of the following conditions:  Respiratory failure and toxidrome   Critical care was time spent personally by me on the following activities:  Development of treatment plan with patient or surrogate, discussions with consultants, evaluation of patient's response to treatment, examination of patient, ordering and review of laboratory studies, ordering and review of radiographic studies, ordering and performing treatments and interventions, pulse oximetry, re-evaluation of patient's condition and review of old charts   Care discussed with: admitting provider   Procedure Name: Intubation Date/Time: 09/20/2021 6:03 PM  Performed by: Dionne Bucy, MDPre-anesthesia Checklist: Patient identified, Patient being monitored, Emergency Drugs available, Timeout performed and Suction available Oxygen Delivery Method: Non-rebreather mask Preoxygenation: Pre-oxygenation with 100% oxygen Induction Type: Rapid sequence Ventilation: Mask ventilation without difficulty Laryngoscope Size: Glidescope and 3 Grade View: Grade I Tube size: 7.5 mm Number of attempts: 1 Placement Confirmation: ETT inserted through vocal cords under direct vision, CO2 detector and Breath sounds checked- equal and bilateral Secured at: 24 cm Tube secured with: ETT holder       MEDICATIONS ORDERED IN ED: Medications  fentaNYL in NS (19mcg/ml) infusion-PREMIX (100 mcg/hr Intravenous New Bag/Given 09/20/21 1814)  polyethylene glycol (MIRALAX / GLYCOLAX) packet 17 g (has no administration in time range)  heparin injection 5,000 Units (has no administration in time  range)  pantoprazole (PROTONIX) 2 mg/mL oral suspension 40 mg (has no administration in time range)  docusate (COLACE) 50 MG/5ML liquid 100 mg (has no administration in time range)  etomidate (AMIDATE) injection (20 mg Intravenous Given 09/20/21 1750)  succinylcholine (ANECTINE) injection (100 mg Intravenous Given 09/20/21 1750)  propofol (DIPRIVAN) 1000 MG/100ML infusion ( Intravenous Rate/Dose Change 09/20/21 1944)  sodium chloride 0.9 % bolus 1,000 mL (0 mLs Intravenous Stopped 09/20/21 2018)  sodium chloride 0.9 % bolus 1,000 mL (1,000 mLs Intravenous New Bag/Given 09/20/21 1946)     IMPRESSION / MDM / ASSESSMENT AND PLAN / ED COURSE  I reviewed the triage vital signs and the nursing notes.  51 year old female with PMH as noted above presents with altered mental status after she became intoxicated with alcohol and then had a fall hitting her head.  EMS reported that the patient was alternately agitated and apneic, and on ED arrival she continued to be intermittently combative, flailing around  in the bed, unable to be redirected, and then more somnolent appearing with poor respiratory effort.  She required 6 L supplemental oxygen to maintain an O2 saturation in the 90s.  Given her mental status, intermittently poor respiratory effort, level of intoxication, and possibility of head injury, she required intubation for airway protection.  Intubation was completed successfully on the first attempt.  Differential diagnosis includes, but is not limited to, intracranial hemorrhage, concussion, minor head injury, alcohol intoxication, other toxidrome, less likely other metabolic cause.  We will obtain CT head, chest x-ray, lab work-up, and reassess.  I anticipate admission.  Patient's presentation is most consistent with acute presentation with potential threat to life or bodily function.  The patient is on the cardiac monitor to evaluate for evidence of arrhythmia and/or significant heart rate  changes.  ----------------------------------------- 8:27 PM on 09/20/2021 -----------------------------------------  CT head shows no traumatic findings.  Chest x-ray shows ET tube in good position.  There is a questionable finding of a very small right pneumothorax although there are lung markings seen outside of this and it is more consistent with a skinfold.  The patient is maintaining a normal O2 saturation on the vent.  Lactate is elevated as is the ethanol but other labs are unremarkable.  I consulted Dr. Isaiah Serge from ICU and discussed the patient with him.  He evaluated the patient and agrees with ICU admission.    FINAL CLINICAL IMPRESSION(S) / ED DIAGNOSES   Final diagnoses:  Alcoholic intoxication with complication (HCC)  Acute respiratory failure, unspecified whether with hypoxia or hypercapnia (HCC)     Rx / DC Orders   ED Discharge Orders     None        Note:  This document was prepared using Dragon voice recognition software and may include unintentional dictation errors.    Dionne Bucy, MD 09/20/21 2029

## 2021-09-20 NOTE — Progress Notes (Signed)
eLink Physician-Brief Progress Note Patient Name: Kaiya Boatman DOB: 1970/05/22 MRN: [id number]   Date of Service  09/20/2021  HPI/Events of Note  ETOH intoxication, apneic >> intubated [place] on prop/ fent  eICU Interventions  ABG ok Rpt CXR clarifies no pneumothorax     Intervention Category Evaluation Type: New Patient Evaluation  Nassir Neidert V. [place] Gopal 09/20/2021, 9:57 PM

## 2021-09-20 NOTE — ED Triage Notes (Signed)
Patient brought in via ems from friends home. Patient had been drinking alcohol at the pool and was walking when she fell and hit her head. Patient combative with ems and was given 2mg  versed

## 2021-09-21 ENCOUNTER — Encounter: Payer: Self-pay | Admitting: Pulmonary Disease

## 2021-09-21 ENCOUNTER — Inpatient Hospital Stay: Payer: Medicare Other

## 2021-09-21 DIAGNOSIS — F10929 Alcohol use, unspecified with intoxication, unspecified: Secondary | ICD-10-CM

## 2021-09-21 DIAGNOSIS — F1020 Alcohol dependence, uncomplicated: Secondary | ICD-10-CM | POA: Diagnosis present

## 2021-09-21 DIAGNOSIS — J9600 Acute respiratory failure, unspecified whether with hypoxia or hypercapnia: Secondary | ICD-10-CM

## 2021-09-21 LAB — CBC
HCT: 31.6 % — ABNORMAL LOW (ref 36.0–46.0)
Hemoglobin: 10 g/dL — ABNORMAL LOW (ref 12.0–15.0)
MCH: 32.5 pg (ref 26.0–34.0)
MCHC: 31.6 g/dL (ref 30.0–36.0)
MCV: 102.6 fL — ABNORMAL HIGH (ref 80.0–100.0)
Platelets: 187 10*3/uL (ref 150–400)
RBC: 3.08 MIL/uL — ABNORMAL LOW (ref 3.87–5.11)
RDW: 13.9 % (ref 11.5–15.5)
WBC: 4.1 10*3/uL (ref 4.0–10.5)
nRBC: 0 % (ref 0.0–0.2)

## 2021-09-21 LAB — BASIC METABOLIC PANEL
Anion gap: 7 (ref 5–15)
BUN: 8 mg/dL (ref 6–20)
CO2: 21 mmol/L — ABNORMAL LOW (ref 22–32)
Calcium: 7.5 mg/dL — ABNORMAL LOW (ref 8.9–10.3)
Chloride: 116 mmol/L — ABNORMAL HIGH (ref 98–111)
Creatinine, Ser: 0.55 mg/dL (ref 0.44–1.00)
GFR, Estimated: >60 mL/min (ref >60)
Glucose, Bld: 71 mg/dL (ref 70–99)
Potassium: 3.1 mmol/L — ABNORMAL LOW (ref 3.5–5.1)
Sodium: 144 mmol/L (ref 135–145)

## 2021-09-21 LAB — MAGNESIUM: Magnesium: 2 mg/dL (ref 1.7–2.4)

## 2021-09-21 LAB — GLUCOSE, CAPILLARY: Glucose-Capillary: 85 mg/dL (ref 70–99)

## 2021-09-21 LAB — PHOSPHORUS: Phosphorus: 3.4 mg/dL (ref 2.5–4.6)

## 2021-09-21 LAB — HIV ANTIBODY (ROUTINE TESTING W REFLEX): HIV Screen 4th Generation wRfx: NONREACTIVE

## 2021-09-21 MED ORDER — PROPOFOL 1000 MG/100ML IV EMUL
5.0000 ug/kg/min | INTRAVENOUS | Status: DC
  Administered 2021-09-21: 60 ug/kg/min via INTRAVENOUS
  Administered 2021-09-21: 40 ug/kg/min via INTRAVENOUS
  Filled 2021-09-21 (×2): qty 100

## 2021-09-21 MED ORDER — THIAMINE HCL 100 MG PO TABS
100.0000 mg | ORAL_TABLET | Freq: Every day | ORAL | Status: DC
  Administered 2021-09-22 – 2021-09-24 (×3): 100 mg via ORAL
  Filled 2021-09-21 (×3): qty 1

## 2021-09-21 MED ORDER — FLUOXETINE HCL 20 MG PO CAPS
40.0000 mg | ORAL_CAPSULE | Freq: Every day | ORAL | Status: DC
  Administered 2021-09-21 – 2021-09-24 (×4): 40 mg via ORAL
  Filled 2021-09-21 (×7): qty 2

## 2021-09-21 MED ORDER — LORAZEPAM 1 MG PO TABS: 1.0000 mg | ORAL_TABLET | Freq: Four times a day (QID) | ORAL | Status: AC | PRN

## 2021-09-21 MED ORDER — DOCUSATE SODIUM 100 MG PO CAPS
100.0000 mg | ORAL_CAPSULE | Freq: Two times a day (BID) | ORAL | Status: DC | PRN
Start: 2021-09-21 — End: 2021-09-24

## 2021-09-21 MED ORDER — LORAZEPAM 1 MG PO TABS: 1.0000 mg | ORAL_TABLET | Freq: Every day | ORAL | Status: DC

## 2021-09-21 MED ORDER — CLONAZEPAM 1 MG PO TABS
2.0000 mg | ORAL_TABLET | Freq: Three times a day (TID) | ORAL | Status: DC | PRN
  Administered 2021-09-21: 2 mg via ORAL
  Filled 2021-09-21: qty 2

## 2021-09-21 MED ORDER — LORAZEPAM 1 MG PO TABS
1.0000 mg | ORAL_TABLET | Freq: Two times a day (BID) | ORAL | Status: DC
  Administered 2021-09-23: 1 mg via ORAL
  Filled 2021-09-21 (×2): qty 1

## 2021-09-21 MED ORDER — LOPERAMIDE HCL 2 MG PO CAPS: 2.0000 mg | ORAL_CAPSULE | ORAL | Status: AC | PRN

## 2021-09-21 MED ORDER — POTASSIUM CHLORIDE 20 MEQ PO PACK: 40.0000 meq | PACK | ORAL | Status: DC

## 2021-09-21 MED ORDER — SODIUM CHLORIDE 0.9 % IV SOLN: INTRAVENOUS | Status: DC | PRN

## 2021-09-21 MED ORDER — LORAZEPAM 1 MG PO TABS
1.0000 mg | ORAL_TABLET | Freq: Three times a day (TID) | ORAL | Status: AC
  Administered 2021-09-22 – 2021-09-23 (×3): 1 mg via ORAL
  Filled 2021-09-21 (×3): qty 1

## 2021-09-21 MED ORDER — LACTATED RINGERS IV BOLUS
1000.0000 mL | Freq: Once | INTRAVENOUS | Status: AC
  Administered 2021-09-21: 1000 mL via INTRAVENOUS

## 2021-09-21 MED ORDER — THIAMINE HCL 100 MG/ML IJ SOLN
100.0000 mg | Freq: Once | INTRAMUSCULAR | Status: AC
  Administered 2021-09-21: 100 mg via INTRAVENOUS

## 2021-09-21 MED ORDER — HYDROXYZINE HCL 25 MG PO TABS: 25.0000 mg | ORAL_TABLET | Freq: Four times a day (QID) | ORAL | Status: AC | PRN

## 2021-09-21 MED ORDER — THIAMINE HCL 100 MG/ML IJ SOLN
100.0000 mg | Freq: Once | INTRAMUSCULAR | Status: DC
  Filled 2021-09-21: qty 2

## 2021-09-21 MED ORDER — FOLIC ACID 1 MG PO TABS
1.0000 mg | ORAL_TABLET | Freq: Every day | ORAL | Status: DC
  Administered 2021-09-21 – 2021-09-24 (×4): 1 mg via ORAL
  Filled 2021-09-21 (×4): qty 1

## 2021-09-21 MED ORDER — ADULT MULTIVITAMIN W/MINERALS CH: 1.0000 | ORAL_TABLET | Freq: Every day | ORAL | Status: DC

## 2021-09-21 MED ORDER — APIXABAN 5 MG PO TABS
5.0000 mg | ORAL_TABLET | Freq: Two times a day (BID) | ORAL | Status: DC
  Administered 2021-09-21 – 2021-09-24 (×7): 5 mg via ORAL
  Filled 2021-09-21 (×7): qty 1

## 2021-09-21 MED ORDER — POLYETHYLENE GLYCOL 3350 17 G PO PACK: 17.0000 g | PACK | Freq: Every day | ORAL | Status: DC | PRN

## 2021-09-21 MED ORDER — POTASSIUM CHLORIDE 10 MEQ/100ML IV SOLN
10.0000 meq | INTRAVENOUS | Status: DC
  Filled 2021-09-21 (×5): qty 100

## 2021-09-21 MED ORDER — IBUPROFEN 400 MG PO TABS
600.0000 mg | ORAL_TABLET | Freq: Once | ORAL | Status: AC
  Administered 2021-09-21: 600 mg via ORAL
  Filled 2021-09-21: qty 2

## 2021-09-21 MED ORDER — LORAZEPAM 1 MG PO TABS
1.0000 mg | ORAL_TABLET | Freq: Four times a day (QID) | ORAL | Status: AC
  Administered 2021-09-21 – 2021-09-22 (×4): 1 mg via ORAL
  Filled 2021-09-21 (×4): qty 1

## 2021-09-21 MED ORDER — ONDANSETRON 4 MG PO TBDP: 4.0000 mg | ORAL_TABLET | Freq: Four times a day (QID) | ORAL | Status: AC | PRN

## 2021-09-21 MED ORDER — POTASSIUM CHLORIDE 10 MEQ/100ML IV SOLN
10.0000 meq | INTRAVENOUS | Status: AC
  Administered 2021-09-21 (×4): 10 meq via INTRAVENOUS
  Filled 2021-09-21 (×4): qty 100

## 2021-09-21 MED ORDER — ADULT MULTIVITAMIN W/MINERALS CH
1.0000 | ORAL_TABLET | Freq: Every day | ORAL | Status: DC
  Administered 2021-09-21 – 2021-09-24 (×4): 1 via ORAL
  Filled 2021-09-21 (×4): qty 1

## 2021-09-21 MED ORDER — THIAMINE MONONITRATE 100 MG PO TABS
100.0000 mg | ORAL_TABLET | Freq: Every day | ORAL | Status: DC
  Filled 2021-09-21: qty 1

## 2021-09-21 NOTE — Consult Note (Signed)
[facility] Face-to-Face Psychiatry Consult   Reason for Consult:  alcohol abuse Referring Physician:  Harlon Ditty, NP Patient Identification: Nicole Macdonald MRN:  [id number] Principal Diagnosis: Alcohol use disorder, severe, dependence (HCC) Diagnosis:  Principal Problem:   Alcohol use disorder, severe, dependence (HCC) Active Problems:   Fall   Total Time spent with patient: 45 minutes  Subjective:   Nicole Macdonald is a 51 y.o. female patient admitted with alcohol intoxication with a fall.  HPI:  51 yo female with alcohol use disorder, consult for request for assistance to stop drinking.  She reports drinking liquor and beer daily, "a lot", last drink prior to admission.  Denies other substance use besides nicotine along with depression, anxiety, suicidal/homicidal ideations, and hallucinations.  She complains of tremors, Ativan alcohol detox placed.  Currently, she resides with her mother and ex-husband.  Recommend rehab which she is in agreement, TOC consult placed for assistance.  Past Psychiatric History: alcohol use disorder  Risk to Self:  none Risk to Others:  none Prior Inpatient Therapy:  denies Prior Outpatient Therapy:  Dr Evelene Croon  Past Medical History: Denies Family History: mother with alcohol use disorder Family Psychiatric  History: mother-alcohol use disorder Social History:  Social History   Substance and Sexual Activity  Alcohol Use Not on file     Social History   Substance and Sexual Activity  Drug Use Not on file    Social History   Socioeconomic History   Marital status: Not on file    Spouse name: Not on file   Number of children: Not on file   Years of education: Not on file   Highest education level: Not on file  Occupational History   Not on file  Tobacco Use   Smoking status: Not on file   Smokeless tobacco: Not on file  Substance and Sexual Activity   Alcohol use: Not on file   Drug use: Not on file   Sexual activity: Not on file   Other Topics Concern   Not on file  Social History Narrative   Not on file   Social Determinants of Health   Financial Resource Strain: Not on file  Food Insecurity: Not on file  Transportation Needs: Not on file  Physical Activity: Not on file  Stress: Not on file  Social Connections: Not on file   Additional Social History:    Allergies:  No Known Allergies  Labs:  Results for orders placed or performed during the hospital encounter of 09/20/21 (from the past 48 hour(s))  Comprehensive metabolic panel     Status: Abnormal   Collection Time: 09/20/21  5:56 PM  Result Value Ref Range   Sodium 146 (H) 135 - 145 mmol/L   Potassium 3.7 3.5 - 5.1 mmol/L   Chloride 112 (H) 98 - 111 mmol/L   CO2 25 22 - 32 mmol/L   Glucose, Bld 93 70 - 99 mg/dL    Comment: Glucose reference range applies only to samples taken after fasting for at least 8 hours.   BUN 9 6 - 20 mg/dL   Creatinine, Ser 2.69 0.44 - 1.00 mg/dL   Calcium 8.4 (L) 8.9 - 10.3 mg/dL   Total Protein 7.1 6.5 - 8.1 g/dL   Albumin 4.0 3.5 - 5.0 g/dL   AST 38 15 - 41 U/L   ALT 32 0 - 44 U/L   Alkaline Phosphatase 59 38 - 126 U/L   Total Bilirubin 0.4 0.3 - 1.2 mg/dL   GFR, Estimated >  60 >60 mL/min    Comment: (NOTE) Calculated using the CKD-EPI Creatinine Equation (2021)    Anion gap 9 5 - 15    Comment: Performed at [facility], [address] [place], [place] [address]  CBC     Status: Abnormal   Collection Time: 09/20/21  5:56 PM  Result Value Ref Range   WBC 4.8 4.0 - 10.5 K/uL   RBC 3.76 (L) 3.87 - 5.11 MIL/uL   Hemoglobin 12.4 12.0 - 15.0 g/dL   HCT 60.4 54.0 - 98.1 %   MCV 103.2 (H) 80.0 - 100.0 fL   MCH 33.0 26.0 - 34.0 pg   MCHC 32.0 30.0 - 36.0 g/dL   RDW 19.1 47.8 - 29.5 %   Platelets 233 150 - 400 K/uL   nRBC 0.0 0.0 - 0.2 %    Comment: Performed at [facility], [address], [place], [place] [address]  Acetaminophen level     Status: Abnormal   Collection Time: 09/20/21   5:56 PM  Result Value Ref Range   Acetaminophen (Tylenol), Serum <10 (L) 10 - 30 ug/mL    Comment: (NOTE) Therapeutic concentrations vary significantly. A range of 10-30 ug/mL  may be an effective concentration for many patients. However, some  are best treated at concentrations outside of this range. Acetaminophen concentrations >150 ug/mL at 4 hours after ingestion  and >50 ug/mL at 12 hours after ingestion are often associated with  toxic reactions.  Performed at [facility], [address] [place], [place] [address]   Salicylate level     Status: Abnormal   Collection Time: 09/20/21  5:56 PM  Result Value Ref Range   Salicylate Lvl <7.0 (L) 7.0 - 30.0 mg/dL    Comment: Performed at [facility], [address] [place], [place] [address]  Troponin I (High Sensitivity)     Status: None   Collection Time: 09/20/21  5:56 PM  Result Value Ref Range   Troponin I (High Sensitivity) 4 <18 ng/L    Comment: (NOTE) Elevated high sensitivity troponin I (hsTnI) values and significant  changes across serial measurements may suggest ACS but many other  chronic and acute conditions are known to elevate hsTnI results.  Refer to the "Links" section for chest pain algorithms and additional  guidance. Performed at [facility], [address] [place], [place] [address]   Ethanol     Status: Abnormal   Collection Time: 09/20/21  5:56 PM  Result Value Ref Range   Alcohol, Ethyl (B) 391 (HH) <10 mg/dL    Comment: CRITICAL RESULT CALLED TO, READ BACK BY AND VERIFIED WITH MONTANA GILLEY 1832 09/20/21 MU (NOTE) Lowest detectable limit for serum alcohol is 10 mg/dL.  For medical purposes only. Performed at [facility], [address] [place], [place] [address]   Blood gas, arterial     Status: Abnormal   Collection Time: 09/20/21  6:40 PM  Result Value Ref Range   FIO2 40 %   Delivery systems VENTILATOR    Mode ASSIST CONTROL    MECHVT 450 mL    PEEP 5 cm H20   pH, Arterial 7.39 7.35 - 7.45   pCO2 arterial 37 32 - 48 mmHg   pO2, Arterial 111 (H) 83 - 108 mmHg   Bicarbonate 22.4 20.0 - 28.0 mmol/L   Acid-base deficit 2.2 (H) 0.0 - 2.0 mmol/L   O2 Saturation 99.7 %   Patient temperature 37.0    Collection  site RIGHT RADIAL    Allens test (pass/fail) PASS PASS   Mechanical Rate 16     Comment: Performed at [facility], [address] [place], [place] [address]  Urine Drug Screen, Qualitative     Status: Abnormal   Collection Time: 09/20/21  6:51 PM  Result Value Ref Range   Tricyclic, Ur Screen NONE DETECTED NONE DETECTED   Amphetamines, Ur Screen POSITIVE (A) NONE DETECTED   MDMA (Ecstasy)Ur Screen NONE DETECTED NONE DETECTED   Cocaine Metabolite,Ur [place] NONE DETECTED NONE DETECTED   Opiate, Ur Screen NONE DETECTED NONE DETECTED   Phencyclidine (PCP) Ur S NONE DETECTED NONE DETECTED   Cannabinoid 50 Ng, Ur [place] NONE DETECTED NONE DETECTED   Barbiturates, Ur Screen NONE DETECTED NONE DETECTED   Benzodiazepine, Ur Scrn POSITIVE (A) NONE DETECTED   Methadone Scn, Ur NONE DETECTED NONE DETECTED    Comment: (NOTE) Tricyclics + metabolites, urine    Cutoff 1000 ng/mL Amphetamines + metabolites, urine  Cutoff 1000 ng/mL MDMA (Ecstasy), urine              Cutoff 500 ng/mL Cocaine Metabolite, urine          Cutoff 300 ng/mL Opiate + metabolites, urine        Cutoff 300 ng/mL Phencyclidine (PCP), urine         Cutoff 25 ng/mL Cannabinoid, urine                 Cutoff 50 ng/mL Barbiturates + metabolites, urine  Cutoff 200 ng/mL Benzodiazepine, urine              Cutoff 200 ng/mL Methadone, urine                   Cutoff 300 ng/mL  The urine drug screen provides only a preliminary, unconfirmed analytical test result and should not be used for non-medical purposes. Clinical consideration and professional judgment should be applied to any positive drug screen result due to possible interfering substances. A more specific  alternate chemical method must be used in order to obtain a confirmed analytical result. Gas chromatography / mass spectrometry (GC/MS) is the preferred confirm atory method. Performed at [facility], [address] [place], [place] [address]   Pregnancy, urine     Status: None   Collection Time: 09/20/21  6:51 PM  Result Value Ref Range   Preg Test, Ur NEGATIVE NEGATIVE    Comment: Performed at [facility], [address] [place], [place] [address]  Lactic acid, plasma     Status: Abnormal   Collection Time: 09/20/21  6:59 PM  Result Value Ref Range   Lactic Acid, Venous 2.4 (HH) 0.5 - 1.9 mmol/L    Comment: CRITICAL RESULT CALLED TO, READ BACK BY AND VERIFIED WITH KATIE MARTINEZ 09/20/21 1927 MU Performed at [facility], [address] [place], [place] [address]   Troponin I (High Sensitivity)     Status: None   Collection Time: 09/20/21  8:19 PM  Result Value Ref Range   Troponin I (High Sensitivity) 3 <18 ng/L    Comment: (NOTE) Elevated high sensitivity troponin I (hsTnI) values and significant  changes across serial measurements may suggest ACS but many other  chronic and acute conditions are known to elevate hsTnI results.  Refer to the "Links" section for chest pain algorithms and additional  guidance. Performed at [facility], [address] [place], [place] [address]   HIV Antibody (routine testing  w rflx)     Status: None   Collection Time: 09/20/21  8:19 PM  Result Value Ref Range   HIV Screen 4th Generation wRfx Non Reactive Non Reactive    Comment: Performed at [facility], [address] [place], [place] [address]  CBC     Status: Abnormal   Collection Time: 09/20/21  8:19 PM  Result Value Ref Range   WBC 2.9 (L) 4.0 - 10.5 K/uL   RBC 3.04 (L) 3.87 - 5.11 MIL/uL   Hemoglobin 10.0 (L) 12.0 - 15.0 g/dL   HCT 76.7 (L) 20.9 - 47.0 %   MCV 101.3 (H) 80.0 - 100.0 fL   MCH 32.9 26.0 - 34.0 pg   MCHC 32.5  30.0 - 36.0 g/dL   RDW 96.2 83.6 - 62.9 %   Platelets 187 150 - 400 K/uL   nRBC 0.0 0.0 - 0.2 %    Comment: Performed at [facility], [address] [place], [place] [address]  Creatinine, serum     Status: None   Collection Time: 09/20/21  8:19 PM  Result Value Ref Range   Creatinine, Ser 0.59 0.44 - 1.00 mg/dL   GFR, Estimated >65 >03 mL/min    Comment: (NOTE) Calculated using the CKD-EPI Creatinine Equation (2021) Performed at [facility], [address] [place], [place] [address]   Glucose, capillary     Status: None   Collection Time: 09/20/21  9:46 PM  Result Value Ref Range   Glucose-Capillary 85 70 - 99 mg/dL    Comment: Glucose reference range applies only to samples taken after fasting for at least 8 hours.  MRSA Next Gen by PCR, Nasal     Status: None   Collection Time: 09/20/21  9:48 PM   Specimen: Nasal Mucosa; Nasal Swab  Result Value Ref Range   MRSA by PCR Next Gen NOT DETECTED NOT DETECTED    Comment: (NOTE) The GeneXpert MRSA Assay (FDA approved for NASAL specimens only), is one component of a comprehensive MRSA colonization surveillance program. It is not intended to diagnose MRSA infection nor to guide or monitor treatment for MRSA infections. Test performance is not FDA approved in patients less than 25 years old. Performed at [facility], [address] [place], [place] [address]   Lactic acid, plasma     Status: Abnormal   Collection Time: 09/20/21 10:05 PM  Result Value Ref Range   Lactic Acid, Venous 2.4 (HH) 0.5 - 1.9 mmol/L    Comment: CRITICAL VALUE NOTED. VALUE IS CONSISTENT WITH PREVIOUSLY REPORTED/CALLED VALUE RH Performed at [facility], [address] [place], [place] [address]   CBC     Status: Abnormal   Collection Time: 09/21/21  4:17 AM  Result Value Ref Range   WBC 4.1 4.0 - 10.5 K/uL   RBC 3.08 (L) 3.87 - 5.11 MIL/uL   Hemoglobin 10.0 (L) 12.0 - 15.0 g/dL   HCT 74.9 (L) 44.9 - 67.5 %    MCV 102.6 (H) 80.0 - 100.0 fL   MCH 32.5 26.0 - 34.0 pg   MCHC 31.6 30.0 - 36.0 g/dL   RDW 91.6 38.4 - 66.5 %   Platelets 187 150 - 400 K/uL   nRBC 0.0 0.0 - 0.2 %    Comment: Performed at [facility], [address] [place], [place] [address]  Basic metabolic panel     Status: Abnormal   Collection Time: 09/21/21  4:17 AM  Result Value  Ref Range   Sodium 144 135 - 145 mmol/L   Potassium 3.1 (L) 3.5 - 5.1 mmol/L   Chloride 116 (H) 98 - 111 mmol/L   CO2 21 (L) 22 - 32 mmol/L   Glucose, Bld 71 70 - 99 mg/dL    Comment: Glucose reference range applies only to samples taken after fasting for at least 8 hours.   BUN 8 6 - 20 mg/dL   Creatinine, Ser 8.41 0.44 - 1.00 mg/dL   Calcium 7.5 (L) 8.9 - 10.3 mg/dL   GFR, Estimated >66 >06 mL/min    Comment: (NOTE) Calculated using the CKD-EPI Creatinine Equation (2021)    Anion gap 7 5 - 15    Comment: Performed at [facility], [address] [place], [place] [address]  Magnesium     Status: None   Collection Time: 09/21/21  4:17 AM  Result Value Ref Range   Magnesium 2.0 1.7 - 2.4 mg/dL    Comment: Performed at [facility], [address] [place], [place] [address]  Phosphorus     Status: None   Collection Time: 09/21/21  4:17 AM  Result Value Ref Range   Phosphorus 3.4 2.5 - 4.6 mg/dL    Comment: Performed at [facility], [address] [place], [place] [address]    Current Facility-Administered Medications  Medication Dose Route Frequency Provider Last Rate Last Admin   0.9 %  sodium chloride infusion   Intravenous PRN Erin Fulling, MD   Stopped at 09/21/21 1039   apixaban (ELIQUIS) tablet 5 mg  5 mg Oral BID Erin Fulling, MD   5 mg at 09/21/21 1036   Chlorhexidine Gluconate Cloth 2 % PADS 6 each  6 each Topical Q0600 Mannam, Praveen, MD   6 each at 09/21/21 2202   clonazePAM (KLONOPIN) tablet 2 mg  2 mg Oral TID PRN Erin Fulling, MD   2 mg at 09/21/21 1036   docusate sodium (COLACE)  capsule 100 mg  100 mg Oral BID PRN Tressie Ellis, RPH       FLUoxetine (PROZAC) capsule 40 mg  40 mg Oral Daily Erin Fulling, MD   40 mg at 09/21/21 1037   folic acid (FOLVITE) tablet 1 mg  1 mg Oral Daily Kasa, Wallis Bamberg, MD       multivitamin with minerals tablet 1 tablet  1 tablet Oral Daily Erin Fulling, MD       Oral care mouth rinse  15 mL Mouth Rinse Q2H Mannam, Praveen, MD   15 mL at 09/21/21 5427   Oral care mouth rinse  15 mL Mouth Rinse PRN Mannam, Praveen, MD       polyethylene glycol (MIRALAX / GLYCOLAX) packet 17 g  17 g Oral Daily PRN Tressie Ellis, RPH       potassium chloride 10 mEq in 100 mL IVPB  10 mEq Intravenous Q1 Hr x 4 Dorothea Ogle B, RPH 100 mL/hr at 09/21/21 1341 10 mEq at 09/21/21 1341   thiamine (VITAMIN B1) tablet 100 mg  100 mg Oral Daily Erin Fulling, MD        Musculoskeletal: Strength & Muscle Tone: decreased Gait & Station:  did not witness Patient leans: N/A  Psychiatric Specialty Exam: Physical Exam Vitals and nursing note reviewed.  Constitutional:      Appearance: Normal appearance.  HENT:     Head: Normocephalic.     Nose: Nose normal.  Pulmonary:     Effort: Pulmonary effort is normal.  Musculoskeletal:     Cervical back: Normal range of motion.  Neurological:     General: No focal deficit present.     Mental Status: She is alert and oriented to person, place, and time.  Psychiatric:        Attention and Perception: Attention and perception normal.        Mood and Affect: Mood is anxious.        Speech: Speech normal.        Behavior: Behavior normal. Behavior is cooperative.        Thought Content: Thought content normal.        Cognition and Memory: Cognition and memory normal.        Judgment: Judgment normal.     Review of Systems  Neurological:  Positive for tremors.  Psychiatric/Behavioral:  Positive for substance abuse. The patient is nervous/anxious.   All other systems reviewed and are negative.   Blood pressure  98/73, pulse 87, temperature 98.3 F (36.8 C), temperature source Oral, resp. rate 13, height  (1.753 m), weight 64.2 kg, SpO2 98 %.Body mass index is 20.9 kg/m.  General Appearance: Casual  Eye Contact:  Good  Speech:  Normal Rate  Volume:  Normal  Mood:  Anxious  Affect:  Congruent  Thought Process:  Coherent and Descriptions of Associations: Intact  Orientation:  Full (Time, Place, and Person)  Thought Content:  WDL and Logical  Suicidal Thoughts:  No  Homicidal Thoughts:  No  Memory:  Immediate;   Good Recent;   Good Remote;   Good  Judgement:  Fair  Insight:  Fair  Psychomotor Activity:  Decreased  Concentration:  Concentration: Fair and Attention Span: Fair  Recall:  Fair  Fund of Knowledge:  Good  Language:  Good  Akathisia:  No  Handed:  Right  AIMS (if indicated):     Assets:  Housing Leisure Time Physical Health Resilience Social Support  ADL's:  Intact  Cognition:  WNL  Sleep:        Physical Exam: Physical Exam Vitals and nursing note reviewed.  Constitutional:      Appearance: Normal appearance.  HENT:     Head: Normocephalic.     Nose: Nose normal.  Pulmonary:     Effort: Pulmonary effort is normal.  Musculoskeletal:     Cervical back: Normal range of motion.  Neurological:     General: No focal deficit present.     Mental Status: She is alert and oriented to person, place, and time.  Psychiatric:        Attention and Perception: Attention and perception normal.        Mood and Affect: Mood is anxious.        Speech: Speech normal.        Behavior: Behavior normal. Behavior is cooperative.        Thought Content: Thought content normal.        Cognition and Memory: Cognition and memory normal.        Judgment: Judgment normal.    Review of Systems  Neurological:  Positive for tremors.  Psychiatric/Behavioral:  Positive for substance abuse. The patient is nervous/anxious.   All other systems reviewed and are negative.  Blood  pressure 98/73, pulse 87, temperature 98.3 F (36.8 C), temperature source Oral, resp. rate 13, height  (1.753 m), weight 64.2 kg, SpO2 98 %. Body mass index is 20.9 kg/m.  Treatment Plan Summary: Alcohol use disorder, severe: Ativan alcohol detox protocol  started Recommend rehab, TOC consult in place to assist  Disposition: No evidence of imminent risk to self or others at present.   Patient does not meet criteria for psychiatric inpatient admission. Supportive therapy provided about ongoing stressors.  Nanine Means, NP 09/21/2021 2:21 PM

## 2021-09-21 NOTE — Consult Note (Addendum)
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 In the ED patient was admitted for airway protection. Pharmacy consulted to assist with electrolyte monitoring and replacement as indicated.  Patient pending extubation  Goal of Therapy:  Electrolytes within normal limits  Plan:  --K 3.1, Kcl 10 mEq IV x 4 doses --Follow-up electrolytes with AM labs tomorrow  Tressie Ellis 09/21/2021 8:27 AM

## 2021-09-21 NOTE — Progress Notes (Signed)
NAME:  Nicole Macdonald, MRN:  [id number], DOB:  22-Oct-1970, LOS: 1 ADMISSION DATE:  09/20/2021  CHIEF COMPLAINT:  Follow up RESP FAILURE    History of Present Illness/SYNOPSIS  51 y.o. 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  Per EMS she went to friend's house already intoxicated. fell and hit the head outside of the pool.  She was alternatively apneic and agitated and intubated in the ED for airway protection.  CT head and chest x-ray labs obtained and PCCM consulted for admission.   +ETOH INTOXICATION INTUBATED ON VENT  +ANEMIA PREG TEST NEG MRSA NEG   Significant Hospital Events: Including procedures, antibiotic start and stop dates in addition to other pertinent events   8/17 intubated for acute mental status changes 8/18    Antimicrobials:   Antibiotics Given (last 72 hours)     None            Interim History / Subjective:  Remains on vent On pressors Remains critically ill Plan for neuro assessment      Objective   Blood pressure 93/68, pulse 67, temperature 98.1 F (36.7 C), temperature source Oral, resp. rate 16, height 5' 9"  (1.753 m), weight 64.2 kg, SpO2 99 %.    Vent Mode: PRVC FiO2 (%):  [30 %-60 %] 30 % Set Rate:  [16 bmp] 16 bmp Vt Set:  [450 mL] 450 mL PEEP:  [5 cmH20] 5 cmH20   Intake/Output Summary (Last 24 hours) at 09/21/2021 0727 Last data filed at 09/21/2021 9794 Gross per 24 hour  Intake 406.26 ml  Output 1000 ml  Net -593.74 ml   Filed Weights   09/20/21 1756 09/20/21 2140 09/21/21 0500  Weight: 60 kg 64.2 kg 64.2 kg      REVIEW OF SYSTEMS  PATIENT IS UNABLE TO PROVIDE COMPLETE REVIEW OF SYSTEMS DUE TO SEVERE CRITICAL ILLNESS  ALL OTHER ROS ARE NEGATIVE   PHYSICAL  EXAMINATION:  GENERAL:critically ill appearing, +resp distress EYES: Pupils equal, round, reactive to light.  No scleral icterus.  MOUTH: Moist mucosal membrane. INTUBATED NECK: Supple.  PULMONARY: +rhonchi, +wheezing CARDIOVASCULAR: S1 and S2.  No murmurs  GASTROINTESTINAL: Soft, nontender, -distended. Positive bowel sounds.  MUSCULOSKELETAL: No swelling, clubbing, or edema.  NEUROLOGIC: obtunded SKIN:intact,warm,dry    Labs/imaging that I havepersonally reviewed  (right click and "Reselect all SmartList Selections" daily)      ASSESSMENT AND PLAN SYNOPSIS  Severe ACUTE Hypoxic and Hypercapnic Respiratory Failure due to altered mental status due to ETOH intoxication leading to inability to protect airway and high risk for asphyxiation -continue Mechanical Ventilator support -continue Bronchodilator Therapy -Wean Fio2 and PEEP as tolerated -VAP/VENT bundle implementation -will perform SAT/SBT when respiratory parameters are met  Vent Mode: PRVC FiO2 (%):  [30 %-60 %] 30 % Set Rate:  [16 bmp] 16 bmp Vt Set:  [450 mL] 450 mL PEEP:  [5 cmH20] 5 cmH20   CARDIAC ICU monitoring   KIDENY -continue Foley Catheter-assess need -Avoid nephrotoxic agents -Follow urine output, BMP -Ensure adequate renal perfusion, optimize oxygenation -Renal dose medications   Intake/Output Summary (Last 24 hours) at 09/21/2021 0727 Last data filed at 09/21/2021 0626 Gross per 24 hour  Intake 406.26 ml  Output 1000 ml  Net -593.74 ml     NEUROLOGY Acute  metabolic  encephalopathy WUA pending  SHOCK SOURCE-HYPOVOLUMIA -use vasopressors to keep MAP>65 as needed -follow ABG and LA -follow up cultures -aggressive IV fluid resuscitation   ENDO - ICU hypoglycemic\Hyperglycemia protocol -check FSBS per protocol   GI GI PROPHYLAXIS as indicated  NUTRITIONAL STATUS DIET-->TF's as tolerated Constipation protocol as indicated   ELECTROLYTES -follow labs as needed -replace as  needed -pharmacy consultation and following     Best practice (right click and "Reselect all SmartList Selections" daily)  Diet:  NPO Pain/Anxiety/Delirium protocol (if indicated): Yes (RASS goal 0) VAP protocol (if indicated): Yes DVT prophylaxis: LMWH GI prophylaxis: H2B Foley:  Yes, and it is still needed Mobility:  bed rest  Code Status:  FULL CODE Disposition: ICU  Labs   CBC: Recent Labs  Lab 09/20/21 1756 09/20/21 2019 09/21/21 0417  WBC 4.8 2.9* 4.1  HGB 12.4 10.0* 10.0*  HCT 38.8 30.8* 31.6*  MCV 103.2* 101.3* 102.6*  PLT 233 187 893    Basic Metabolic Panel: Recent Labs  Lab 09/20/21 1756 09/20/21 2019 09/21/21 0417  NA 146*  --  144  K 3.7  --  3.1*  CL 112*  --  116*  CO2 25  --  21*  GLUCOSE 93  --  71  BUN 9  --  8  CREATININE 0.66 0.59 0.55  CALCIUM 8.4*  --  7.5*  MG  --   --  2.0  PHOS  --   --  3.4   GFR: Estimated Creatinine Clearance: 82.4 mL/min (by C-G formula based on SCr of 0.55 mg/dL). Recent Labs  Lab 09/20/21 1756 09/20/21 1859 09/20/21 2019 09/20/21 2205 09/21/21 0417  WBC 4.8  --  2.9*  --  4.1  LATICACIDVEN  --  2.4*  --  2.4*  --     Liver Function Tests: Recent Labs  Lab 09/20/21 1756  AST 38  ALT 32  ALKPHOS 59  BILITOT 0.4  PROT 7.1  ALBUMIN 4.0   No results for input(s): "LIPASE", "AMYLASE" in the last 168 hours. No results for input(s): "AMMONIA" in the last 168 hours.  ABG    Component Value Date/Time   PHART 7.39 09/20/2021 1840   PCO2ART 37 09/20/2021 1840   PO2ART 111 (H) 09/20/2021 1840   HCO3 22.4 09/20/2021 1840   ACIDBASEDEF 2.2 (H) 09/20/2021 1840   O2SAT 99.7 09/20/2021 1840     Coagulation Profile: No results for input(s): "INR", "PROTIME" in the last 168 hours.  Cardiac Enzymes: No results for input(s): "CKTOTAL", "CKMB", "CKMBINDEX", "TROPONINI" in the last 168 hours.  HbA1C: No results found for: "HGBA1C"  CBG: Recent Labs  Lab 09/20/21 2146  GLUCAP 85     Allergies No Known Allergies     DVT/GI PRX  assessed I Assessed the need for Labs I Assessed the need for Foley I Assessed the need for Central Venous Line Family Discussion when available I Assessed the need for Mobilization I made an Assessment of medications to be adjusted accordingly Safety Risk assessment completed  CASE DISCUSSED IN MULTIDISCIPLINARY ROUNDS WITH ICU TEAM     Critical Care Time devoted to patient care services described in this note is 45 minutes.  Critical care was necessary to treat or prevent imminent or life-threatening deterioration.     Corrin Parker, M.D.  [facility]  Medical Director [place] Director [facility] Cardio-Pulmonary Department

## 2021-09-21 NOTE — Progress Notes (Signed)
eLink Physician-Brief Progress Note Patient Name: Nicole Macdonald DOB: 1970-09-22 MRN: [id number]   Date of Service  09/21/2021  HPI/Events of Note  Hypotensive on 60 mcg prop  eICU Interventions  Lower prop 1L LR bolus     Intervention Category Major Interventions: Hypotension - evaluation and management  Caedyn Tassinari V. Kitara Hebb 09/21/2021, 3:51 AM

## 2021-09-21 NOTE — Progress Notes (Signed)
Pt. Extubated to room air,sat 93%,no apparent distress noted at this time.

## 2021-09-22 DIAGNOSIS — J9600 Acute respiratory failure, unspecified whether with hypoxia or hypercapnia: Secondary | ICD-10-CM

## 2021-09-22 DIAGNOSIS — E876 Hypokalemia: Secondary | ICD-10-CM

## 2021-09-22 DIAGNOSIS — F1020 Alcohol dependence, uncomplicated: Secondary | ICD-10-CM

## 2021-09-22 DIAGNOSIS — G9341 Metabolic encephalopathy: Secondary | ICD-10-CM

## 2021-09-22 LAB — BASIC METABOLIC PANEL
Anion gap: 5 (ref 5–15)
BUN: 9 mg/dL (ref 6–20)
CO2: 26 mmol/L (ref 22–32)
Calcium: 8.5 mg/dL — ABNORMAL LOW (ref 8.9–10.3)
Chloride: 105 mmol/L (ref 98–111)
Creatinine, Ser: 0.53 mg/dL (ref 0.44–1.00)
GFR, Estimated: >60 mL/min (ref >60)
Glucose, Bld: 82 mg/dL (ref 70–99)
Potassium: 3.8 mmol/L (ref 3.5–5.1)
Sodium: 136 mmol/L (ref 135–145)

## 2021-09-22 LAB — MAGNESIUM: Magnesium: 1.9 mg/dL (ref 1.7–2.4)

## 2021-09-22 LAB — PHOSPHORUS: Phosphorus: 3 mg/dL (ref 2.5–4.6)

## 2021-09-22 LAB — CBC
HCT: 33 % — ABNORMAL LOW (ref 36.0–46.0)
Hemoglobin: 10.5 g/dL — ABNORMAL LOW (ref 12.0–15.0)
MCH: 33.2 pg (ref 26.0–34.0)
MCHC: 31.8 g/dL (ref 30.0–36.0)
MCV: 104.4 fL — ABNORMAL HIGH (ref 80.0–100.0)
Platelets: 174 10*3/uL (ref 150–400)
RBC: 3.16 MIL/uL — ABNORMAL LOW (ref 3.87–5.11)
RDW: 13.2 % (ref 11.5–15.5)
WBC: 5 10*3/uL (ref 4.0–10.5)
nRBC: 0 % (ref 0.0–0.2)

## 2021-09-22 LAB — LACTIC ACID, PLASMA: Lactic Acid, Venous: 0.7 mmol/L (ref 0.5–1.9)

## 2021-09-22 MED ORDER — NICOTINE 14 MG/24HR TD PT24
14.0000 mg | MEDICATED_PATCH | Freq: Every day | TRANSDERMAL | Status: DC
  Administered 2021-09-22 – 2021-09-24 (×3): 14 mg via TRANSDERMAL
  Filled 2021-09-22 (×3): qty 1

## 2021-09-22 MED ORDER — IBUPROFEN 400 MG PO TABS
600.0000 mg | ORAL_TABLET | Freq: Three times a day (TID) | ORAL | Status: DC | PRN
Start: 2021-09-22 — End: 2021-09-24
  Administered 2021-09-22 – 2021-09-23 (×3): 600 mg via ORAL
  Filled 2021-09-22 (×3): qty 2

## 2021-09-22 NOTE — Progress Notes (Signed)
PROGRESS NOTE    Nicole Macdonald  VZS:[id number] DOB: 11-Mar-1970 DOA: 09/20/2021 PCP: No primary care provider on file.    Brief Narrative:  51 y.o. 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  Per EMS she went to friend's house already intoxicated.)  Hold portable catheter and then fell and hit the head outside of the pool.  She was alternatively apneic and agitated and intubated in the ED for airway protection.  CT head and chest x-ray labs obtained and PCCM consulted for admission. +ETOH INTOXICATION 8/17 intubated for acute mental status changes  8/19 TRH pickup  Consultants:  pccm  Procedures: s/p extubation  Antimicrobials:      Subjective: Has no sob or cp. Feels little sore in throat.   Objective: Vitals:   09/22/21 1000 09/22/21 1033 09/22/21 1100 09/22/21 1200  BP: 112/70 120/76 114/69 115/73  Pulse: (!) 56 63 (!) 54 (!) 59  Resp: 13 15 15 11   Temp:    97.7 F (36.5 C)  TempSrc:    Oral  SpO2: 95% 94% 93% 94%  Weight:      Height:        Intake/Output Summary (Last 24 hours) at 09/22/2021 1348 Last data filed at 09/22/2021 0200 Gross per 24 hour  Intake 1555.51 ml  Output 1650 ml  Net -94.49 ml   Filed Weights   09/20/21 2140 09/21/21 0500 09/22/21 0500  Weight: 64.2 kg 64.2 kg 65.7 kg    Examination:  Calm, NAD Cta no w/r Reg s1/s2 no gallop Soft benign +bs No edema Awake and alert, grossly intact Mood and affect appropriate in current setting     Data Reviewed: I have personally reviewed following labs and imaging studies  CBC: Recent Labs  Lab 09/20/21 1756 09/20/21 2019 09/21/21 0417 09/22/21 0540  WBC 4.8 2.9* 4.1 5.0  HGB 12.4 10.0* 10.0* 10.5*  HCT 38.8 30.8* 31.6* 33.0*  MCV 103.2* 101.3* 102.6* 104.4*  PLT  233 187 187 174   Basic Metabolic Panel: Recent Labs  Lab 09/20/21 1756 09/20/21 2019 09/21/21 0417 09/22/21 0540  NA 146*  --  144 136  K 3.7  --  3.1* 3.8  CL 112*  --  116* 105  CO2 25  --  21* 26  GLUCOSE 93  --  71 82  BUN 9  --  8 9  CREATININE 0.66 0.59 0.55 0.53  CALCIUM 8.4*  --  7.5* 8.5*  MG  --   --  2.0 1.9  PHOS  --   --  3.4 3.0   GFR: Estimated Creatinine Clearance: 84.3 mL/min (by C-G formula based on SCr of 0.53 mg/dL). Liver Function Tests: Recent Labs  Lab 09/20/21 1756  AST 38  ALT 32  ALKPHOS 59  BILITOT 0.4  PROT 7.1  ALBUMIN 4.0   No results for input(s): "LIPASE", "AMYLASE" in the last 168 hours. No results for input(s): "AMMONIA" in the last 168 hours. Coagulation Profile: No results for input(s): "INR", "PROTIME" in the last 168 hours. Cardiac Enzymes: No results for input(s): "CKTOTAL", "CKMB", "CKMBINDEX", "TROPONINI" in the last 168 hours. BNP (last 3 results) No results for input(s): "PROBNP" in the last 8760 hours. HbA1C: No results for input(s): "HGBA1C" in the last 72 hours. CBG: Recent Labs  Lab  09/20/21 2146  GLUCAP 85   Lipid Profile: No results for input(s): "CHOL", "HDL", "LDLCALC", "TRIG", "CHOLHDL", "LDLDIRECT" in the last 72 hours. Thyroid Function Tests: No results for input(s): "TSH", "T4TOTAL", "FREET4", "T3FREE", "THYROIDAB" in the last 72 hours. Anemia Panel: No results for input(s): "VITAMINB12", "FOLATE", "FERRITIN", "TIBC", "IRON", "RETICCTPCT" in the last 72 hours. Sepsis Labs: Recent Labs  Lab 09/20/21 1859 09/20/21 2205 09/22/21 0540  LATICACIDVEN 2.4* 2.4* 0.7    Recent Results (from the past 240 hour(s))  MRSA Next Gen by PCR, Nasal     Status: None   Collection Time: 09/20/21  9:48 PM   Specimen: Nasal Mucosa; Nasal Swab  Result Value Ref Range Status   MRSA by PCR Next Gen NOT DETECTED NOT DETECTED Final    Comment: (NOTE) The GeneXpert MRSA Assay (FDA approved for NASAL specimens  only), is one component of a comprehensive MRSA colonization surveillance program. It is not intended to diagnose MRSA infection nor to guide or monitor treatment for MRSA infections. Test performance is not FDA approved in patients less than 63 years old. Performed at [facility], [address], [place], [place] [address]          Radiology Studies: DG Chest [place] 1 View  Result Date: 09/21/2021 CLINICAL DATA:  Acute respiratory failure EXAM: PORTABLE CHEST 1 VIEW COMPARISON:  Film from the previous day. FINDINGS: Cardiac shadow is stable. Endotracheal tube is noted in satisfactory position. Gastric catheter is noted coiled within the stomach. The lungs are well aerated bilaterally. Improving aeration in the left retrocardiac region is noted. No bony abnormality is seen. IMPRESSION: Tubes and lines in satisfactory position. Improving aeration in the left base. Electronically Signed   By: Alcide Clever M.D.   On: 09/21/2021 03:51   DG Chest Port 1 View  Result Date: 09/20/2021 CLINICAL DATA:  Evaluate for pneumothorax EXAM: PORTABLE CHEST 1 VIEW COMPARISON:  Radiograph earlier today FINDINGS: Stable position of the endotracheal and enteric tubes. Normal cardiomediastinal silhouette. No pleural effusion or pneumothorax. Similar retrocardiac atelectasis/consolidation and mild interstitial markings bilaterally. No acute osseous abnormality. IMPRESSION: 1. No pneumothorax. The previous lucency at the right lung apex was likely secondary to a skin fold. 2. Endotracheal and enteric tubes in good position. 3. Retrocardiac atelectasis/consolidation. Electronically Signed   By: Minerva Fester M.D.   On: 09/20/2021 20:46   CT HEAD WO CONTRAST ( )  Result Date: 09/20/2021 CLINICAL DATA:  Head trauma.  Altered mental status. EXAM: CT HEAD WITHOUT CONTRAST TECHNIQUE: Contiguous axial images were obtained from the base of the skull through the vertex without intravenous contrast. RADIATION  DOSE REDUCTION: This exam was performed according to the departmental dose-optimization program which includes automated exposure control, adjustment of the mA and/or kV according to patient size and/or use of iterative reconstruction technique. COMPARISON:  CT head 08/17/2021 FINDINGS: Brain: No evidence of acute infarction, hemorrhage, hydrocephalus, extra-axial collection or mass lesion/mass effect. Vascular: Negative for hyperdense vessel Skull: Negative for skull fracture Sinuses/Orbits: Mucosal edema paranasal sinuses.  Negative orbit Other: None IMPRESSION: No acute abnormality. Electronically Signed   By: Marlan Palau M.D.   On: 09/20/2021 18:39   DG Chest Portable 1 View  Addendum Date: 09/20/2021   ADDENDUM REPORT: 09/20/2021 18:39 ADDENDUM: These results were called by telephone at the time of interpretation on 09/20/2021 at 6:41 pm to provider [facility] , who verbally acknowledged these results. Electronically Signed   By: Donzetta Kohut M.D.   On: 09/20/2021 18:39  Result Date: 09/20/2021 CLINICAL DATA:  Intubation and OG tube placement. EXAM: PORTABLE CHEST 1 VIEW COMPARISON:  Comparison made with November 05, 2019. FINDINGS: Endotracheal tube 2.3 cm from the carina. Gastric tube coursing into the stomach. Side port near the GE junction. EKG leads project over the abdomen and chest. Cardiomediastinal contours and hilar structures are stable. No lobar consolidation. Mild increased interstitial markings. Subtle LEFT retrocardiac airspace disease. No definitive pneumothorax. Small lucent line in the RIGHT lung apex is favored to represent a skin fold. On limited assessment no acute skeletal findings. IMPRESSION: 1. Endotracheal tube 2.3 cm from the carina. 2. Gastric tube terminates in the stomach, side port near the GE junction. 3. Subtle LEFT retrocardiac airspace disease, may reflect atelectasis or developing consolidation. Aspiration related changes are also considered. 4. Lucency at  the RIGHT lung apex favored to represent a skin fold but cannot be tract lateral to the pleural space. Lung markings can be seen peripheral to this area. Consider close follow-up to exclude small anterior and lateral pneumothorax. Electronically Signed: By: Zetta Bills M.D. On: 09/20/2021 18:35        Scheduled Meds:  apixaban  5 mg Oral BID   Chlorhexidine Gluconate Cloth  6 each Topical Q0600   FLUoxetine  40 mg Oral Daily   folic acid  1 mg Oral Daily   LORazepam  1 mg Oral TID   Followed by   Derrill Memo ON 09/23/2021] LORazepam  1 mg Oral BID   Followed by   Derrill Memo ON 09/25/2021] LORazepam  1 mg Oral Daily   multivitamin with minerals  1 tablet Oral Daily   thiamine  100 mg Oral Daily   Continuous Infusions:  sodium chloride Stopped (09/21/21 1735)    Assessment & Plan:   Principal Problem:   Alcohol use disorder, severe, dependence ([place]) Active Problems:   Fall   Acute respiratory failure ([place])   Acute metabolic encephalopathy   Hypokalemia S/p Fall   Acute respiratory failure, inability to protect airway due to intoxication Intubated for respiratory protection She may have retrocardiac opacity possibly atelectasis.  was Observe off antibiotics 8/19 s/p extubation 8/18  on RA Continue to monitor Can transfer to PCU    Acute metabolic encephalopathy  Alcohol Abuse AME secondary to alcohol intoxication CT head is unremarkable 8/19 psychiatry following,  Ativan alcohol detox protocal Recommended rehab, toc consult placed.    Hypokalemia Was replaced K stable  DVT prophylaxis: Eliquis Code Status: Full Family Communication: None at bedside Disposition Plan:  Status is: Inpatient Remains inpatient appropriate because: IV treatment        LOS: 2 days   Time spent: 35 minutes    Nolberto Hanlon, MD [facility] Pager [phone]  If 7PM-7AM, please contact night-coverage 09/22/2021, 1:48 PM

## 2021-09-22 NOTE — TOC Initial Note (Signed)
Transition of Care [facility]) - Initial/Assessment Note    Patient Details  Name: Nicole Macdonald MRN: [id number] Date of Birth: 1970/11/08  [facility] CM/SW Contact:    Magnus Ivan, LCSW Phone Number: 09/22/2021, 9:52 AM  Clinical Narrative:                 TOC received consult for SA resources. Met with patient at bedside. Patient lives with her ex husband and mother. Patient drives herself to appointments. PCP is [employer]. Pharmacy is [facility] in [place]. No DME/HH/SNF history.  Patient is interested in SA resources. List provided at bedside and encouraged patient to reach out to these resources.  Expected Discharge Plan: Home/Self Care Barriers to Discharge: Continued Medical Work up   Patient Goals and CMS Choice Patient states their goals for this hospitalization and ongoing recovery are:: home with ex spouse and mother CMS Medicare.gov Compare Post Acute Care list provided to:: Patient Choice offered to / list presented to : Patient  Expected Discharge Plan and Services Expected Discharge Plan: Home/Self Care       Living arrangements for the past 2 months: Single Family Home                                      Prior Living Arrangements/Services Living arrangements for the past 2 months: Single Family Home Lives with:: Relatives, Parents Patient language and need for interpreter reviewed:: Yes Do you feel safe going back to the place where you live?: Yes      Need for Family Participation in Patient Care: Yes (Comment) Care giver support system in place?: Yes (comment)   Criminal Activity/Legal Involvement Pertinent to Current Situation/Hospitalization: No - Comment as needed  Activities of Daily Living      Permission Sought/Granted                  Emotional Assessment       Orientation: : Oriented to Self, Oriented to Place, Oriented to  Time, Oriented to Situation Alcohol / Substance Use: Not Applicable Psych  Involvement: No (comment)  Admission diagnosis:  Fall [W19.XXXA] Alcoholic intoxication with complication ([place]) [M75.449] Acute respiratory failure, unspecified whether with hypoxia or hypercapnia ([place]) [J96.00] Patient Active Problem List   Diagnosis Date Noted   Alcohol use disorder, severe, dependence ([place]) 09/21/2021   Fall 09/20/2021   PCP:  No primary care provider on file. Pharmacy:  No Pharmacies Listed    Social Determinants of Health (SDOH) Interventions    Readmission Risk Interventions     No data to display

## 2021-09-23 DIAGNOSIS — F1020 Alcohol dependence, uncomplicated: Secondary | ICD-10-CM | POA: Diagnosis not present

## 2021-09-23 NOTE — Progress Notes (Signed)
PROGRESS NOTE    Nicole Macdonald  PZW:[id number] DOB: 24-Mar-1970 DOA: 09/20/2021 PCP: No primary care provider on file.    Brief Narrative:  51 y.o. 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  Per EMS she went to friend's house already intoxicated.)  Hold portable catheter and then fell and hit the head outside of the pool.  She was alternatively apneic and agitated and intubated in the ED for airway protection.  CT head and chest x-ray labs obtained and PCCM consulted for admission. +ETOH INTOXICATION 8/17 intubated for acute mental status changes  8/19 TRH pickup  8/20 Pt ambulating fine per nsg, PT was canceled. C/o rt forearm pain, Rt chest wall pain when tries to sit up , no difficulty moving arm or pain with respiration.  Consultants:  pccm  Procedures: s/p extubation  Antimicrobials:      Subjective: No sob, cp, dizziness  Objective: Vitals:   09/23/21 0000 09/23/21 0400 09/23/21 0819 09/23/21 1141  BP: (!) 136/95 (!) 163/95 (!) 143/69 130/72  Pulse: 70 64 62   Resp: 16 16 12 17   Temp: 97.8 F (36.6 C) 97.8 F (36.6 C) 98.3 F (36.8 C) 98 F (36.7 C)  TempSrc: Oral Oral Oral Oral  SpO2: 99% 97% 98% 96%  Weight:      Height:        Intake/Output Summary (Last 24 hours) at 09/23/2021 1327 Last data filed at 09/22/2021 1900 Gross per 24 hour  Intake 480 ml  Output --  Net 480 ml   Filed Weights   09/20/21 2140 09/21/21 0500 09/22/21 0500  Weight: 64.2 kg 64.2 kg 65.7 kg    Examination: Calm, NAD Cta no w/r Reg s1/s2 no gallop Chest wall, no pain illicited with palpation Soft benign +bs No edema no limitations with movements x4 Alert and awake. Mood and affect appropriate in current setting     Data Reviewed: I have personally  reviewed following labs and imaging studies  CBC: Recent Labs  Lab 09/20/21 1756 09/20/21 2019 09/21/21 0417 09/22/21 0540  WBC 4.8 2.9* 4.1 5.0  HGB 12.4 10.0* 10.0* 10.5*  HCT 38.8 30.8* 31.6* 33.0*  MCV 103.2* 101.3* 102.6* 104.4*  PLT 233 187 187 174   Basic Metabolic Panel: Recent Labs  Lab 09/20/21 1756 09/20/21 2019 09/21/21 0417 09/22/21 0540  NA 146*  --  144 136  K 3.7  --  3.1* 3.8  CL 112*  --  116* 105  CO2 25  --  21* 26  GLUCOSE 93  --  71 82  BUN 9  --  8 9  CREATININE 0.66 0.59 0.55 0.53  CALCIUM 8.4*  --  7.5* 8.5*  MG  --   --  2.0 1.9  PHOS  --   --  3.4 3.0   GFR: Estimated Creatinine Clearance: 84.3 mL/min (by C-G formula based on SCr of 0.53 mg/dL). Liver Function Tests: Recent Labs  Lab 09/20/21 1756  AST 38  ALT 32  ALKPHOS 59  BILITOT 0.4  PROT 7.1  ALBUMIN 4.0   No results for input(s): "LIPASE", "AMYLASE" in the last 168 hours. No results for input(s): "AMMONIA" in the last 168 hours. Coagulation Profile: No results for input(s): "INR", "PROTIME" in the last 168 hours. Cardiac Enzymes: No  results for input(s): "CKTOTAL", "CKMB", "CKMBINDEX", "TROPONINI" in the last 168 hours. BNP (last 3 results) No results for input(s): "PROBNP" in the last 8760 hours. HbA1C: No results for input(s): "HGBA1C" in the last 72 hours. CBG: Recent Labs  Lab 09/20/21 2146  GLUCAP 85   Lipid Profile: No results for input(s): "CHOL", "HDL", "LDLCALC", "TRIG", "CHOLHDL", "LDLDIRECT" in the last 72 hours. Thyroid Function Tests: No results for input(s): "TSH", "T4TOTAL", "FREET4", "T3FREE", "THYROIDAB" in the last 72 hours. Anemia Panel: No results for input(s): "VITAMINB12", "FOLATE", "FERRITIN", "TIBC", "IRON", "RETICCTPCT" in the last 72 hours. Sepsis Labs: Recent Labs  Lab 09/20/21 1859 09/20/21 2205 09/22/21 0540  LATICACIDVEN 2.4* 2.4* 0.7    Recent Results (from the past 240 hour(s))  MRSA Next Gen by PCR, Nasal     Status: None    Collection Time: 09/20/21  9:48 PM   Specimen: Nasal Mucosa; Nasal Swab  Result Value Ref Range Status   MRSA by PCR Next Gen NOT DETECTED NOT DETECTED Final    Comment: (NOTE) The GeneXpert MRSA Assay (FDA approved for NASAL specimens only), is one component of a comprehensive MRSA colonization surveillance program. It is not intended to diagnose MRSA infection nor to guide or monitor treatment for MRSA infections. Test performance is not FDA approved in patients less than 2 years old. Performed at [facility], [address], [place], [place] [address]          Radiology Studies: No results found.      Scheduled Meds:  apixaban  5 mg Oral BID   Chlorhexidine Gluconate Cloth  6 each Topical Q0600   FLUoxetine  40 mg Oral Daily   folic acid  1 mg Oral Daily   LORazepam  1 mg Oral BID   Followed by   Melene Muller ON 09/25/2021] LORazepam  1 mg Oral Daily   multivitamin with minerals  1 tablet Oral Daily   nicotine  14 mg Transdermal Daily   thiamine  100 mg Oral Daily   Continuous Infusions:  sodium chloride Stopped (09/21/21 1735)    Assessment & Plan:   Principal Problem:   Alcohol use disorder, severe, dependence (HCC) Active Problems:   Fall   Acute respiratory failure (HCC)   Acute metabolic encephalopathy   Hypokalemia   S/p Fall Ambulating fine. PT cancelled. Sore from fall      Acute respiratory failure, inability to protect airway due to intoxication Intubated for respiratory protection She may have retrocardiac opacity possibly atelectasis.  was Observe off antibiotics 8/19 s/p extubation 8/18  on RA 8/20 encourage I-S O2 sat 98% on room air    Acute metabolic encephalopathy  Alcohol Abuse AME secondary to alcohol intoxication CT head is unremarkable 8/19 psychiatry following,  Ativan alcohol detox protocal 8/20 recommend rehab TOC was consulted for  Still scoring       Hypokalemia Was replaced and stable  DVT  prophylaxis: Eliquis Code Status: Full Family Communication: None at bedside Disposition Plan:  Status is: Inpatient Remains inpatient appropriate because: IV treatment        LOS: 3 days   Time spent: 35 minutes    Lynn Ito, MD [facility] Pager [phone]  If 7PM-7AM, please contact night-coverage 09/23/2021, 1:27 PM

## 2021-09-23 NOTE — Progress Notes (Signed)
PT Cancellation Note  Patient Details Name: Nicole Macdonald MRN: [id number] DOB: 01-27-1971   Cancelled Treatment:    Reason Eval/Treat Not Completed: Other (comment) PT orders received, chart reviewed. Pt received in bed reporting c/o pain in L ribs, neck & R UE -- MD made aware via secure chat. After PT initiated evaluation MD cancelled PT orders so evaluation deferred.  Aleda Grana, PT, DPT 09/23/21, 9:32 AM   Sandi Mariscal 09/23/2021, 9:31 AM

## 2021-09-24 DIAGNOSIS — F1020 Alcohol dependence, uncomplicated: Secondary | ICD-10-CM | POA: Diagnosis not present

## 2021-09-24 LAB — ETHYLENE GLYCOL: Ethylene Glycol Lvl: <5 mg/dL

## 2021-09-24 MED ORDER — THIAMINE HCL 100 MG PO TABS: 100.0000 mg | ORAL_TABLET | Freq: Every day | ORAL | 0 refills | Status: AC

## 2021-09-24 MED ORDER — ADULT MULTIVITAMIN W/MINERALS CH: 1.0000 | ORAL_TABLET | Freq: Every day | ORAL | 0 refills | Status: AC

## 2021-09-24 NOTE — Discharge Summary (Signed)
Seva [facility] SHF:[id number] DOB: 10-14-1970 DOA: 09/20/2021  PCP: No primary care provider on file.  Admit date: 09/20/2021 Discharge date: 09/24/2021  Admitted From: home Disposition: Home  Recommendations for Outpatient Follow-up:  Follow up with PCP in 1 week Please obtain BMP/CBC in one week Please follow up with rehab      Discharge Condition:Stable CODE STATUS:full  Diet recommendation: Heart Healthy  Brief/Interim Summary: Per HPI: 51 y.o. 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  Per EMS she went to friend's house already intoxicated.)  Hold portable catheter and then fell and hit the head outside of the pool.  She was alternatively apneic and agitated and intubated in the ED for airway protection.  CT head and chest x-ray labs obtained and PCCM consulted for admission. +ETOH INTOXICATION Patient was  intubated for acute mental status changes, then s/p extubation and transfer to medicine service.  She was having withdrawal which resolved.  Behavioral health was consulted and she was given information for rehab centers.  They did not feel patient will need psychiatric admission.  TOC was consulted to assist with rehab information. CT of the head on admission was without acute abnormalities.  She has tolerated diet, no further withdrawal, and on room air.  She is stable for discharge.  She was counseled extensively on alcohol cessation.  We also discussed extensively with her that she is on anticoagulation and she needs to avoid all alcohol or drug use to prevent falls and other complications as risk of being on anticoagulation is brain or GI hemorrhaging she verbalizes an understanding.      S/p Fall Ambulating fine.            Acute respiratory  failure, inability to protect airway due to intoxication Intubated for respiratory protection She may have retrocardiac opacity possibly atelectasis.  was Observe doff antibiotics s/p extubation , on RA IS      Acute metabolic encephalopathy  Alcohol Abuse AME secondary to alcohol intoxication CT head is unremarkable psychiatry was consulted. Ativan alcohol detox protocal was initiated. She no longer undergoing withdrawal. Extensively counseled to stop using ETOH.  Information given by TOC.            Hypokalemia Was replaced and stable    Discharge Diagnoses:  Principal Problem:   Alcohol use disorder, severe, dependence (HCC) Active Problems:   Fall   Acute respiratory failure (HCC)   Acute metabolic encephalopathy   Hypokalemia    Discharge Instructions  Discharge Instructions     Diet - low sodium heart healthy   Complete by: As directed    Discharge instructions   Complete by: As directed    Stop drinking. Avoid any drug use Can [facility] [place], [phone]. There are also resources in [facility]: [facility], [phone]. [facility], [phone]   Increase activity slowly   Complete by: As directed       Allergies as of 09/24/2021   No Known Allergies      Medication List     STOP taking these medications    amphetamine-dextroamphetamine 20 MG tablet Commonly known as: ADDERALL       TAKE these medications    albuterol 108 (90 Base) MCG/ACT inhaler Commonly known as: VENTOLIN HFA Inhale 2 puffs  into the lungs every 6 (six) hours as needed for wheezing or shortness of breath.   apixaban 5 MG Tabs tablet Commonly known as: ELIQUIS Take 5 mg by mouth 2 (two) times daily.   clonazePAM 2 MG tablet Commonly known as: KLONOPIN Take 2 mg by mouth 3 (three) times daily as needed.   FLUoxetine 40 MG capsule Commonly known as: PROZAC Take 40 mg by mouth daily.   fluticasone  50 MCG/ACT nasal spray Commonly known as: FLONASE Place 1-2 sprays into both nostrils daily.   folic acid 1 MG tablet Commonly known as: FOLVITE Take 1 mg by mouth daily.   multivitamin with minerals Tabs tablet Take 1 tablet by mouth daily. Start taking on: September 25, 2021   rosuvastatin 20 MG tablet Commonly known as: CRESTOR Take 20 mg by mouth daily.   thiamine 100 MG tablet Commonly known as: VITAMIN B1 Take 1 tablet (100 mg total) by mouth daily. Start taking on: September 25, 2021        No Known Allergies  Consultations: Behavioral health, PCCM   Procedures/Studies: DG Chest Port 1 View  Result Date: 09/21/2021 CLINICAL DATA:  Acute respiratory failure EXAM: PORTABLE CHEST 1 VIEW COMPARISON:  Film from the previous day. FINDINGS: Cardiac shadow is stable. Endotracheal tube is noted in satisfactory position. Gastric catheter is noted coiled within the stomach. The lungs are well aerated bilaterally. Improving aeration in the left retrocardiac region is noted. No bony abnormality is seen. IMPRESSION: Tubes and lines in satisfactory position. Improving aeration in the left base. Electronically Signed   By: Alcide Clever M.D.   On: 09/21/2021 03:51   DG Chest Port 1 View  Result Date: 09/20/2021 CLINICAL DATA:  Evaluate for pneumothorax EXAM: PORTABLE CHEST 1 VIEW COMPARISON:  Radiograph earlier today FINDINGS: Stable position of the endotracheal and enteric tubes. Normal cardiomediastinal silhouette. No pleural effusion or pneumothorax. Similar retrocardiac atelectasis/consolidation and mild interstitial markings bilaterally. No acute osseous abnormality. IMPRESSION: 1. No pneumothorax. The previous lucency at the right lung apex was likely secondary to a skin fold. 2. Endotracheal and enteric tubes in good position. 3. Retrocardiac atelectasis/consolidation. Electronically Signed   By: Minerva Fester M.D.   On: 09/20/2021 20:46   CT HEAD WO CONTRAST ( )  Result Date:  09/20/2021 CLINICAL DATA:  Head trauma.  Altered mental status. EXAM: CT HEAD WITHOUT CONTRAST TECHNIQUE: Contiguous axial images were obtained from the base of the skull through the vertex without intravenous contrast. RADIATION DOSE REDUCTION: This exam was performed according to the departmental dose-optimization program which includes automated exposure control, adjustment of the mA and/or kV according to patient size and/or use of iterative reconstruction technique. COMPARISON:  CT head 08/17/2021 FINDINGS: Brain: No evidence of acute infarction, hemorrhage, hydrocephalus, extra-axial collection or mass lesion/mass effect. Vascular: Negative for hyperdense vessel Skull: Negative for skull fracture Sinuses/Orbits: Mucosal edema paranasal sinuses.  Negative orbit Other: None IMPRESSION: No acute abnormality. Electronically Signed   By: Marlan Palau M.D.   On: 09/20/2021 18:39   DG Chest Portable 1 View  Addendum Date: 09/20/2021   ADDENDUM REPORT: 09/20/2021 18:39 ADDENDUM: These results were called by telephone at the time of interpretation on 09/20/2021 at 6:41 pm to provider [facility] , who verbally acknowledged these results. Electronically Signed   By: Donzetta Kohut M.D.   On: 09/20/2021 18:39   Result Date: 09/20/2021 CLINICAL DATA:  Intubation and OG tube placement. EXAM: PORTABLE CHEST 1 VIEW COMPARISON:  Comparison made with November 05, 2019. FINDINGS: Endotracheal tube 2.3 cm from the carina. Gastric tube coursing into the stomach. Side port near the GE junction. EKG leads project over the abdomen and chest. Cardiomediastinal contours and hilar structures are stable. No lobar consolidation. Mild increased interstitial markings. Subtle LEFT retrocardiac airspace disease. No definitive pneumothorax. Small lucent line in the RIGHT lung apex is favored to represent a skin fold. On limited assessment no acute skeletal findings. IMPRESSION: 1. Endotracheal tube 2.3 cm from the carina. 2.  Gastric tube terminates in the stomach, side port near the GE junction. 3. Subtle LEFT retrocardiac airspace disease, may reflect atelectasis or developing consolidation. Aspiration related changes are also considered. 4. Lucency at the RIGHT lung apex favored to represent a skin fold but cannot be tract lateral to the pleural space. Lung markings can be seen peripheral to this area. Consider close follow-up to exclude small anterior and lateral pneumothorax. Electronically Signed: By: Donzetta Kohut M.D. On: 09/20/2021 18:35      Subjective: Has no complaints this AM.  Is excited to go home  Discharge Exam: Vitals:   09/24/21 0735 09/24/21 1240  BP: 119/78 126/77  Pulse: (!) 46 (!) 58  Resp: 19 14  Temp: 98.7 F (37.1 C) 97.8 F (36.6 C)  SpO2: 90% 98%   Vitals:   09/23/21 1141 09/23/21 2130 09/24/21 0735 09/24/21 1240  BP: 130/72 119/74 119/78 126/77  Pulse:  93 (!) 46 (!) 58  Resp: 17 19 19 14   Temp: 98 F (36.7 C) 98.9 F (37.2 C) 98.7 F (37.1 C) 97.8 F (36.6 C)  TempSrc: Oral Oral Oral Oral  SpO2: 96% 95% 90% 98%  Weight:      Height:        General: Pt is alert, awake, not in acute distress Cardiovascular: RRR, S1/S2 +, no rubs, no gallops Respiratory: CTA bilaterally, no wheezing, no rhonchi Abdominal: Soft, NT, ND, bowel sounds + Extremities: no edema, no cyanosis    The results of significant diagnostics from this hospitalization (including imaging, microbiology, ancillary and laboratory) are listed below for reference.     Microbiology: Recent Results (from the past 240 hour(s))  MRSA Next Gen by PCR, Nasal     Status: None   Collection Time: 09/20/21  9:48 PM   Specimen: Nasal Mucosa; Nasal Swab  Result Value Ref Range Status   MRSA by PCR Next Gen NOT DETECTED NOT DETECTED Final    Comment: (NOTE) The GeneXpert MRSA Assay (FDA approved for NASAL specimens only), is one component of a comprehensive MRSA colonization surveillance program. It is not  intended to diagnose MRSA infection nor to guide or monitor treatment for MRSA infections. Test performance is not FDA approved in patients less than 57 years old. Performed at [facility], [address] [place], [place]      Labs: BNP (last 3 results) No results for input(s): "BNP" in the last 8760 hours. Basic Metabolic Panel: Recent Labs  Lab 09/20/21 1756 09/20/21 2019 09/21/21 0417 09/22/21 0540  NA 146*  --  144 136  K 3.7  --  3.1* 3.8  CL 112*  --  116* 105  CO2 25  --  21* 26  GLUCOSE 93  --  71 82  BUN 9  --  8 9  CREATININE 0.66 0.59 0.55 0.53  CALCIUM 8.4*  --  7.5* 8.5*  MG  --   --  2.0 1.9  PHOS  --   --  3.4 3.0  Liver Function Tests: Recent Labs  Lab 09/20/21 1756  AST 38  ALT 32  ALKPHOS 59  BILITOT 0.4  PROT 7.1  ALBUMIN 4.0   No results for input(s): "LIPASE", "AMYLASE" in the last 168 hours. No results for input(s): "AMMONIA" in the last 168 hours. CBC: Recent Labs  Lab 09/20/21 1756 09/20/21 2019 09/21/21 0417 09/22/21 0540  WBC 4.8 2.9* 4.1 5.0  HGB 12.4 10.0* 10.0* 10.5*  HCT 38.8 30.8* 31.6* 33.0*  MCV 103.2* 101.3* 102.6* 104.4*  PLT 233 187 187 174   Cardiac Enzymes: No results for input(s): "CKTOTAL", "CKMB", "CKMBINDEX", "TROPONINI" in the last 168 hours. BNP: Invalid input(s): "POCBNP" CBG: Recent Labs  Lab 09/20/21 2146  GLUCAP 85   D-Dimer No results for input(s): "DDIMER" in the last 72 hours. Hgb A1c No results for input(s): "HGBA1C" in the last 72 hours. Lipid Profile No results for input(s): "CHOL", "HDL", "LDLCALC", "TRIG", "CHOLHDL", "LDLDIRECT" in the last 72 hours. Thyroid function studies No results for input(s): "TSH", "T4TOTAL", "T3FREE", "THYROIDAB" in the last 72 hours.  Invalid input(s): "FREET3" Anemia work up No results for input(s): "VITAMINB12", "FOLATE", "FERRITIN", "TIBC", "IRON", "RETICCTPCT" in the last 72 hours. Urinalysis No results found for: "COLORURINE",  "APPEARANCEUR", "LABSPEC", "PHURINE", "GLUCOSEU", "HGBUR", "BILIRUBINUR", "KETONESUR", "PROTEINUR", "UROBILINOGEN", "NITRITE", "LEUKOCYTESUR" Sepsis Labs Recent Labs  Lab 09/20/21 1756 09/20/21 2019 09/21/21 0417 09/22/21 0540  WBC 4.8 2.9* 4.1 5.0   Microbiology Recent Results (from the past 240 hour(s))  MRSA Next Gen by PCR, Nasal     Status: None   Collection Time: 09/20/21  9:48 PM   Specimen: Nasal Mucosa; Nasal Swab  Result Value Ref Range Status   MRSA by PCR Next Gen NOT DETECTED NOT DETECTED Final    Comment: (NOTE) The GeneXpert MRSA Assay (FDA approved for NASAL specimens only), is one component of a comprehensive MRSA colonization surveillance program. It is not intended to diagnose MRSA infection nor to guide or monitor treatment for MRSA infections. Test performance is not FDA approved in patients less than 54 years old. Performed at [facility], [address] [place], [place] [address]      Time coordinating discharge: Over 30 minutes  SIGNED:   Lynn Ito, MD  [facility] 09/24/2021, 4:45 PM Pager   If 7PM-7AM, please contact night-coverage [email or web address] Password [email or web address]

## 2021-09-24 NOTE — Progress Notes (Signed)
Discharge paperwork reviewed with patient. All questions answered. Pt returning to private residence with friend. Informed of the process for follow up with substance abuse counselors. Paperwork already given to pt by social work. Pt wheeled out by volunteer services.

## 2021-09-24 NOTE — Progress Notes (Signed)
Slept through the nigh. Awakened minimally for vital signs and toileting.

## 2021-09-25 ENCOUNTER — Encounter: Payer: Self-pay | Admitting: Adult Health

## 2021-09-28 ENCOUNTER — Other Ambulatory Visit: Payer: Medicare Other

## 2021-10-04 ENCOUNTER — Other Ambulatory Visit: Payer: Self-pay

## 2021-10-04 DIAGNOSIS — G08 Intracranial and intraspinal phlebitis and thrombophlebitis: Secondary | ICD-10-CM

## 2021-10-05 ENCOUNTER — Inpatient Hospital Stay: Payer: Medicare Other | Attending: Oncology

## 2021-10-05 DIAGNOSIS — G08 Intracranial and intraspinal phlebitis and thrombophlebitis: Secondary | ICD-10-CM

## 2021-10-05 DIAGNOSIS — E611 Iron deficiency: Secondary | ICD-10-CM | POA: Diagnosis present

## 2021-10-05 LAB — CBC WITH DIFFERENTIAL/PLATELET
Abs Immature Granulocytes: 0.04 10*3/uL (ref 0.00–0.07)
Basophils Absolute: 0.1 10*3/uL (ref 0.0–0.1)
Basophils Relative: 1 %
Eosinophils Absolute: 0.5 10*3/uL (ref 0.0–0.5)
Eosinophils Relative: 8 %
HCT: 39.7 % (ref 36.0–46.0)
Hemoglobin: 12.8 g/dL (ref 12.0–15.0)
Immature Granulocytes: 1 %
Lymphocytes Relative: 21 %
Lymphs Abs: 1.5 10*3/uL (ref 0.7–4.0)
MCH: 32.7 pg (ref 26.0–34.0)
MCHC: 32.2 g/dL (ref 30.0–36.0)
MCV: 101.5 fL — ABNORMAL HIGH (ref 80.0–100.0)
Monocytes Absolute: 0.4 10*3/uL (ref 0.1–1.0)
Monocytes Relative: 5 %
Neutro Abs: 4.6 10*3/uL (ref 1.7–7.7)
Neutrophils Relative %: 64 %
Platelets: 291 10*3/uL (ref 150–400)
RBC: 3.91 MIL/uL (ref 3.87–5.11)
RDW: 12.9 % (ref 11.5–15.5)
WBC: 7.1 10*3/uL (ref 4.0–10.5)
nRBC: 0 % (ref 0.0–0.2)

## 2021-10-05 LAB — IRON AND TIBC
Iron: 111 ug/dL (ref 28–170)
Saturation Ratios: 29 % (ref 10.4–31.8)
TIBC: 389 ug/dL (ref 250–450)
UIBC: 278 ug/dL

## 2021-10-05 LAB — FERRITIN: Ferritin: 29 ng/mL (ref 11–307)

## 2021-12-17 ENCOUNTER — Inpatient Hospital Stay: Payer: Medicare Other | Attending: Oncology

## 2021-12-17 ENCOUNTER — Ambulatory Visit: Admission: RE | Admit: 2021-12-17 | Payer: Medicare Other | Source: Ambulatory Visit

## 2021-12-21 ENCOUNTER — Inpatient Hospital Stay: Payer: Medicare Other | Admitting: Oncology

## 2021-12-24 ENCOUNTER — Encounter: Payer: Self-pay | Admitting: Oncology

## 2022-01-11 NOTE — Telephone Encounter (Signed)
Can you call her?

## 2022-01-14 ENCOUNTER — Ambulatory Visit
Admission: RE | Admit: 2022-01-14 | Discharge: 2022-01-14 | Disposition: A | Discharge status: Home / Self Care | Payer: Medicare Other | Source: Ambulatory Visit | Attending: Oncology | Admitting: Oncology

## 2022-01-14 DIAGNOSIS — R9389 Abnormal findings on diagnostic imaging of other specified body structures: Secondary | ICD-10-CM | POA: Insufficient documentation

## 2022-01-29 ENCOUNTER — Other Ambulatory Visit: Payer: Self-pay | Admitting: *Deleted

## 2022-01-29 DIAGNOSIS — D539 Nutritional anemia, unspecified: Secondary | ICD-10-CM

## 2022-01-30 ENCOUNTER — Telehealth: Payer: Self-pay | Admitting: *Deleted

## 2022-01-30 ENCOUNTER — Encounter: Payer: Self-pay | Admitting: Oncology

## 2022-01-30 ENCOUNTER — Inpatient Hospital Stay: Payer: Medicare Other

## 2022-01-30 ENCOUNTER — Inpatient Hospital Stay: Payer: Medicare Other | Attending: Oncology | Admitting: Oncology

## 2022-01-30 VITALS — BP 109/77 | HR 114 | Temp 97.0°F | Resp 16 | Wt 135.0 lb

## 2022-01-30 DIAGNOSIS — R911 Solitary pulmonary nodule: Secondary | ICD-10-CM | POA: Insufficient documentation

## 2022-01-30 DIAGNOSIS — I82619 Acute embolism and thrombosis of superficial veins of unspecified upper extremity: Secondary | ICD-10-CM | POA: Diagnosis present

## 2022-01-30 DIAGNOSIS — Z7901 Long term (current) use of anticoagulants: Secondary | ICD-10-CM | POA: Diagnosis not present

## 2022-01-30 DIAGNOSIS — I82C19 Acute embolism and thrombosis of unspecified internal jugular vein: Secondary | ICD-10-CM | POA: Insufficient documentation

## 2022-01-30 DIAGNOSIS — D539 Nutritional anemia, unspecified: Secondary | ICD-10-CM

## 2022-01-30 DIAGNOSIS — G08 Intracranial and intraspinal phlebitis and thrombophlebitis: Secondary | ICD-10-CM

## 2022-01-30 DIAGNOSIS — R918 Other nonspecific abnormal finding of lung field: Secondary | ICD-10-CM | POA: Diagnosis not present

## 2022-01-30 DIAGNOSIS — F17210 Nicotine dependence, cigarettes, uncomplicated: Secondary | ICD-10-CM | POA: Insufficient documentation

## 2022-01-30 LAB — CBC WITH DIFFERENTIAL/PLATELET
Abs Immature Granulocytes: 0.02 10*3/uL (ref 0.00–0.07)
Basophils Absolute: 0.1 10*3/uL (ref 0.0–0.1)
Basophils Relative: 1 %
Eosinophils Absolute: 0.1 10*3/uL (ref 0.0–0.5)
Eosinophils Relative: 3 %
HCT: 41.8 % (ref 36.0–46.0)
Hemoglobin: 13.7 g/dL (ref 12.0–15.0)
Immature Granulocytes: 1 %
Lymphocytes Relative: 32 %
Lymphs Abs: 1.4 10*3/uL (ref 0.7–4.0)
MCH: 33.7 pg (ref 26.0–34.0)
MCHC: 32.8 g/dL (ref 30.0–36.0)
MCV: 102.7 fL — ABNORMAL HIGH (ref 80.0–100.0)
Monocytes Absolute: 0.4 10*3/uL (ref 0.1–1.0)
Monocytes Relative: 8 %
Neutro Abs: 2.5 10*3/uL (ref 1.7–7.7)
Neutrophils Relative %: 55 %
Platelets: 224 10*3/uL (ref 150–400)
RBC: 4.07 MIL/uL (ref 3.87–5.11)
RDW: 12.5 % (ref 11.5–15.5)
WBC: 4.4 10*3/uL (ref 4.0–10.5)
nRBC: 0 % (ref 0.0–0.2)

## 2022-01-30 LAB — IRON AND TIBC
Iron: 80 ug/dL (ref 28–170)
Saturation Ratios: 19 % (ref 10.4–31.8)
TIBC: 413 ug/dL (ref 250–450)
UIBC: 333 ug/dL

## 2022-01-30 LAB — FERRITIN: Ferritin: 93 ng/mL (ref 11–307)

## 2022-01-30 NOTE — Progress Notes (Signed)
Pt in for follow up, reports has been off of klonopin and adderall for 2 weeks.

## 2022-01-30 NOTE — Progress Notes (Signed)
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  She had a CT and MRI brain as well as CT venogram head which showed left sigmoid sinus and internal jugular vein thrombosis.  As per her discharge summary there was no concern for any oropharyngeal infection that would be suggestive of septic thrombophlebitis.  Patient was discharged on Eliquis and asked to follow-up with hematology and neurology.  She had a hypercoagulable work-up done which was essentially unremarkable including antiphospholipid antibody syndrome panel.  Homocystine levels are mildly elevated at 16.8.     Hypercoagulable workup negative.  JAK2 mutation negative.  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  Patient is currently on Eliquis.  Interval history-patient states that she has a cat at home and often gets scratched by the cat and bruises and bleeds easily due to being on Eliquis.  She is keen on coming off Eliquis at this time.  Reports alcohol intake about 2-3 times a week.  ECOG PS- 1 Pain scale- 2   Review of systems- Review of Systems  Constitutional:  Positive for malaise/fatigue. Negative for chills, fever and weight loss.  HENT:  Negative for congestion, ear discharge and nosebleeds.   Eyes:   Negative for blurred vision.  Respiratory:  Negative for cough, hemoptysis, sputum production, shortness of breath and wheezing.   Cardiovascular:  Negative for chest pain, palpitations, orthopnea and claudication.  Gastrointestinal:  Negative for abdominal pain, blood in stool, constipation, diarrhea, heartburn, melena, nausea and vomiting.  Genitourinary:  Negative for dysuria, flank pain, frequency, hematuria and urgency.  Musculoskeletal:  Negative for back pain, joint pain and myalgias.  Skin:  Negative for rash.  Neurological:  Negative for dizziness, tingling, focal weakness, seizures, weakness and headaches.  Endo/Heme/Allergies:  Does not bruise/bleed easily.  Psychiatric/Behavioral:  Negative for depression and suicidal ideas. The patient does not have insomnia.       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[place])    2023   Manic depression ([place])    Thrombosis    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   TIA (transient ischemic attack)      Past Surgical History:  Procedure Laterality Date   COMPRESSION HIP SCREW Left 04/19/2017   Procedure: left hip cannulated screw pinning;  Surgeon: Corky Mull, MD;  Location: [facility] ORS;  Service: Orthopedics;  Laterality: Left;   denies      Social History   Socioeconomic History   Marital status: Single    Spouse name: Not on file   Number of children: Not on file   Years of education: Not on file   Highest education level: Not on file  Occupational History   Not on file  Tobacco Use   Smoking status: Every Day    Packs/day: 0.50    Types: Cigarettes   Smokeless tobacco: Never  Vaping Use   Vaping Use: Former  Substance and Sexual Activity   Alcohol use: Yes   Drug use: Not Currently    Types: Cocaine, Marijuana    Comment: 15 days ago  last time cocaine, marijuana complted stop   Sexual activity: Not Currently  Other Topics Concern   Not on file  Social History Narrative   ** Merged History Encounter **       Social Determinants of Health   Financial Resource Strain: Not on file  Food Insecurity: Not on file  Transportation Needs: Not on file  Physical Activity: Not on file  Stress: Not on file  Social Connections: Not on file  Intimate Partner Violence: Not on file    Family History  Problem Relation Age of Onset   Ovarian cancer Mother    Diabetes Maternal Grandmother    Lung cancer Maternal Grandmother    Transient ischemic attack Neg Hx    Stroke Neg Hx      Current Outpatient Medications:    albuterol (VENTOLIN HFA) 108 (90 Base) MCG/ACT inhaler, Inhale 2 puffs into the lungs every 6 (six) hours as needed for wheezing or shortness of breath., Disp: 8 g, Rfl: 0   apixaban (ELIQUIS) 5 MG TABS tablet, Take 1 tablet (5 mg total) by mouth 2 (two) times daily., Disp: 60 tablet, Rfl: 0   BIOTIN PO, Take 1 tablet by mouth daily., Disp: , Rfl:    FLUoxetine (PROZAC) 40 MG capsule, Take 40 mg by mouth daily., Disp: , Rfl:    fluticasone (FLONASE) 50 MCG/ACT nasal spray, Place 2 sprays into both nostrils daily., Disp: , Rfl:    naltrexone (DEPADE) 50 MG tablet, Take by mouth., Disp: , Rfl:    vitamin B-12 (CYANOCOBALAMIN) 1000 MCG tablet, Take 1,000 mcg by mouth daily., Disp: , Rfl:    amphetamine-dextroamphetamine (ADDERALL) 20 MG tablet, Take 20 mg by mouth daily. (Patient not taking: Reported on 01/30/2022), Disp: , Rfl:    clonazePAM (KLONOPIN) 2 MG tablet, Take 2 mg by mouth 2 (two) times daily. (Patient not taking: Reported on 01/30/2022), Disp: , Rfl:    nicotine (NICODERM CQ - DOSED IN MG/24 HOURS) 21 mg/24hr patch, Place 1 patch (21 mg total) onto the skin daily. (Patient not taking: Reported on 01/30/2022), Disp: 28 patch, Rfl: 0  Physical exam:  Vitals:   01/30/22 0956  BP: 109/77  Pulse: (!) 114   Resp: 16  Temp: (!) 97 F (36.1 C)  TempSrc: Tympanic  SpO2: 97%  Weight: 135 lb (61.2 kg)   Physical Exam Cardiovascular:     Rate and Rhythm: Normal rate and regular rhythm.     Heart sounds: Normal heart sounds.  Pulmonary:     Effort: Pulmonary effort is normal.     Breath sounds: Normal breath sounds.  Skin:    General: Skin is warm and dry.  Neurological:     Mental Status: She is alert and oriented to person, place, and time.         Latest Ref Rng & Units 09/22/2021    5:40 AM  CMP  Glucose 70 - 99 mg/dL 82   BUN 6 - 20 mg/dL 9   Creatinine 1.61 - 0.96 mg/dL 0.45   Sodium 409 - 811 mmol/L 136   Potassium 3.5 -  5.1 mmol/L 3.8   Chloride 98 - 111 mmol/L 105   CO2 22 - 32 mmol/L 26   Calcium 8.9 - 10.3 mg/dL 8.5       Latest Ref Rng & Units 01/30/2022    9:02 AM  CBC  WBC 4.0 - 10.5 K/uL 4.4   Hemoglobin 12.0 - 15.0 g/dL 13.7   Hematocrit 36.0 - 46.0 % 41.8   Platelets 150 - 400 K/uL 224     No images are attached to the encounter.  CT Chest Wo Contrast  Result Date: 01/15/2022 CLINICAL DATA:  Pulmonary nodule. History of productive cough and congestion. EXAM: CT CHEST WITHOUT CONTRAST TECHNIQUE: Multidetector CT imaging of the chest was performed following the standard protocol without IV contrast. RADIATION DOSE REDUCTION: This exam was performed according to the departmental dose-optimization program which includes automated exposure control, adjustment of the mA and/or kV according to patient size and/or use of iterative reconstruction technique. COMPARISON:  06/13/2021 FINDINGS: Cardiovascular: The heart size is normal. No substantial pericardial effusion. Mild atherosclerotic calcification is noted in the wall of the thoracic aorta. Mediastinum/Nodes: No mediastinal lymphadenopathy. No evidence for gross hilar lymphadenopathy although assessment is limited by the lack of intravenous contrast on the current study. The esophagus has normal imaging features.  There is no axillary lymphadenopathy. Lungs/Pleura: Centrilobular emphsyema noted. Nodular consolidative opacity of concern on the previous study measured at 2.2 cm has resolved completely in the interval. Stable 4 mm left lower lobe subpleural nodule on 180/3. 4 mm subpleural right lower lobe nodule on 73/3 is unchanged. No new suspicious pulmonary nodule or mass. No focal airspace consolidation. No pleural effusion. Upper Abdomen: The liver shows diffusely decreased attenuation suggesting fat deposition. Musculoskeletal: No worrisome lytic or sclerotic osseous abnormality. IMPRESSION: 1. Nodular left lower lobe consolidative opacity of concern on the previous study has resolved completely in the interval. No new or suspicious findings on today's study. 2. Stable tiny bilateral lower lobe pulmonary nodules measuring up to 4 mm. No follow-up needed if patient is low-risk (and has no known or suspected primary neoplasm). Non-contrast chest CT can be considered in 12 months if patient is high-risk. This recommendation follows the consensus statement: Guidelines for Management of Incidental Pulmonary Nodules Detected on CT Images: From the Fleischner Society 2017; Radiology 2017; 284:228-243. 3. Hepatic steatosis. 4.  Aortic Atherosclerosis (ICD10-I70.0). Electronically Signed   By: Misty Stanley M.D.   On: 01/15/2022 12:13     Assessment and plan- Patient is a 51 y.o. female who is here for follow-up of following issues:  Internal jugular vein/sigmoid sinus thrombosis: Possibly secondary to cocaine use.  Hypercoagulable workup negative.  She has been on Eliquis for the last 7 months.  I think it would be okay for her to come off Eliquis at this time but I will also get in touch with Dr. Brigitte Pulse from neurology at this time.  No signs and symptoms of headache or neck swelling.  Lung nodule: Patient was found to have a left lower lobe lung nodule 6 months ago.  I did repeat a CT scan at this time and that nodule has  resolved.  She has other nonspecific subcentimeter nodules which I plan to follow-up in a year's time.  Macrocytic anemia: Likely secondary to alcohol intake.  Continue to monitor   Visit Diagnosis 1. Macrocytic anemia   2. Cerebral venous thrombosis of sigmoid sinus   3. Current use of long term anticoagulation   4. Lung nodules  Dr. Randa Evens, MD, MPH [facility] [id number] 01/30/2022 6:57 PM

## 2022-01-30 NOTE — Telephone Encounter (Signed)
Nurse called Dr Margaretmary Eddy office and left message with receptionist for MD and nurse. This nurse stated that Dr Smith Robert saw patient today and would be ok with discontinuing Eliquis but wanted to consult with Dr Sherryll Burger before having patient discontinue medication. Nurse also inquired about ordered CT Venogram by  Dr Sherryll Burger and MD plans to schedule. Receptionist stated she would send message to MD/nurse and have one of them return our call.

## 2022-02-06 ENCOUNTER — Telehealth: Payer: Self-pay

## 2022-02-06 NOTE — Telephone Encounter (Signed)
Received call from Dr. Trena Platt RN per Dr. Manuella Ghazi is ok to hold off on Eliquis

## 2022-02-06 NOTE — Telephone Encounter (Signed)
Ok please let her know to come off eliquis. Keep f/u with me as scheduled

## 2022-02-06 NOTE — Telephone Encounter (Signed)
done

## 2022-02-06 NOTE — Telephone Encounter (Signed)
Spoke to patient informed her both Dr.Rao and Dr. Dorita Fray are ok with coming off of Eliquis and to f/u with Dr Janese Banks at next apt. Patient agrees and verbalizes understanding

## 2022-05-15 IMAGING — CT CT VENOGRAM HEAD
1 of 7 series · 6 of 47 positions shown · IV contrast (APPLIED)
Comparison: None.

CLINICAL DATA: Dural venous sinus thrombosis suspected

EXAM:
CT VENOGRAM HEAD
TECHNIQUE: Venographic phase images of the brain were obtained following the
administration of intravenous contrast. Multiplanar reformats and
maximum intensity projections were generated.

[Series 3: venogram · axial · 0.44mm/px · z∈[-74,+34]mm · 6 of 76 slices shown]
[im 11/76  brain]
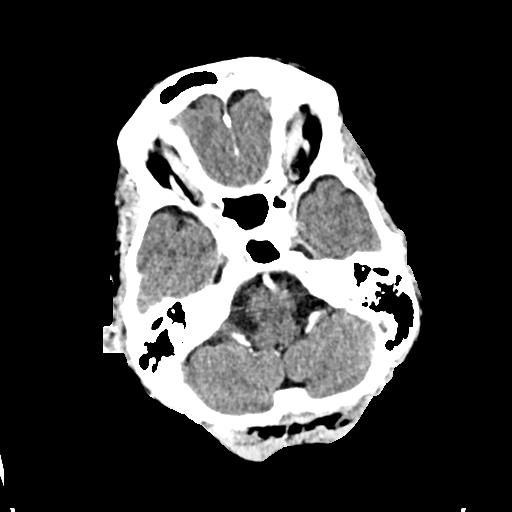
[im 22/76  bone]
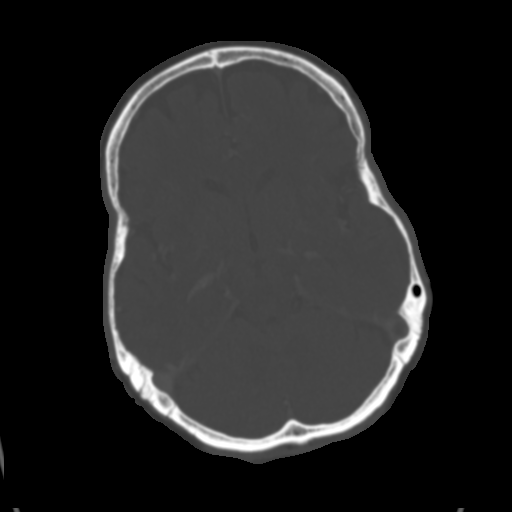
[im 33/76  brain]
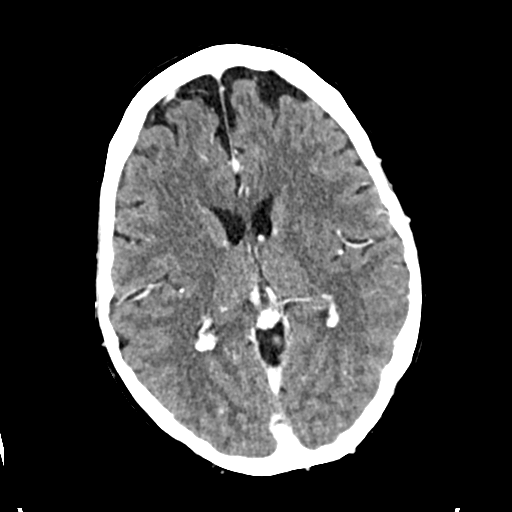
[im 43/76  bone]
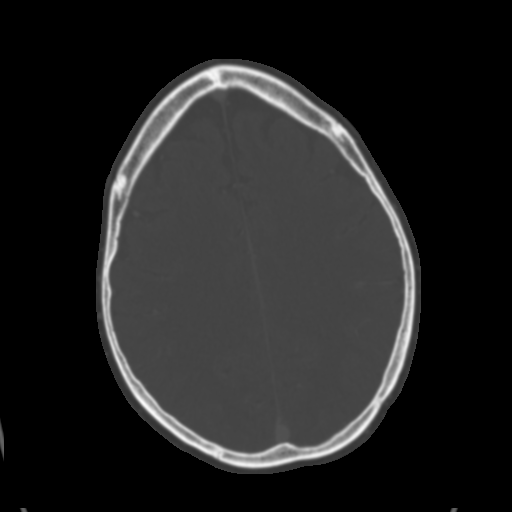
[im 54/76  brain]
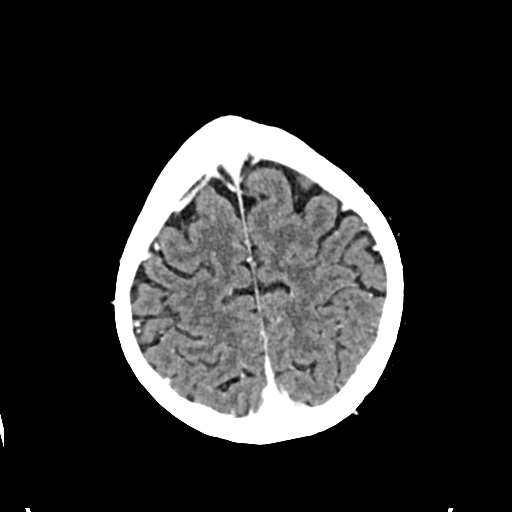
[im 65/76  bone]
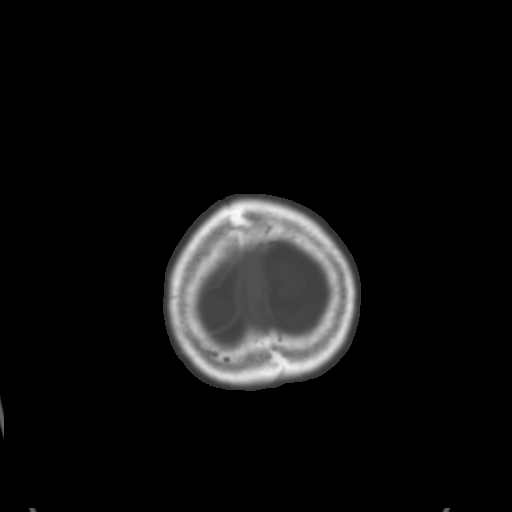

[6 of 47 positions shown; findings below may reference images not displayed]

RADIATION DOSE REDUCTION: This exam was performed according to the
departmental dose-optimization program which includes automated
exposure control, adjustment of the mA and/or kV according to
patient size and/or use of iterative reconstruction technique.

CONTRAST:  75mL OMNIPAQUE IOHEXOL 350 MG/ML SOLN
FINDINGS: There is a filling defect within the left sigmoid sinus that extends
into the visible portion of the internal jugular vein. No other
venous filling defect.
IMPRESSION: Left sigmoid sinus and internal jugular vein thrombosis.

## 2022-05-15 IMAGING — MR MR HEAD W/O CM
7 of 11 series · 34 of 48 positions shown · non-contrast
Comparison: Head CT, CTA, and CTP 05/09/2021

CLINICAL DATA: Neuro deficit, acute, stroke suspected. Left-sided
weakness, slurred speech, and facial droop.

EXAM:
MRI HEAD WITHOUT CONTRAST
TECHNIQUE: Multiplanar, multiecho pulse sequences of the brain and surrounding
structures were obtained without intravenous contrast.

[Series 2: DWI · axial · 3.0mm · 0.94mm/px · z∈[-79,+66]mm · 8 of 100 slices shown (1 of 3)]
[im 1/100]
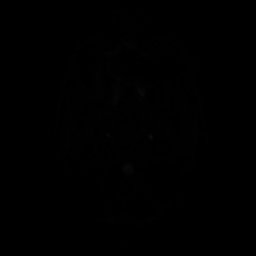
[im 12/100]
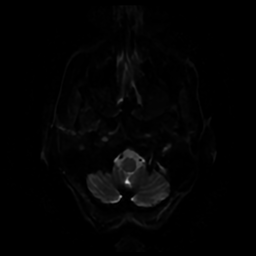
[im 34/100]
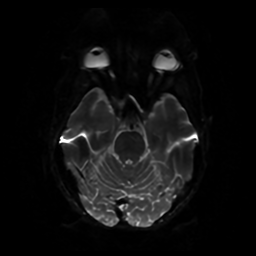
[im 45/100]
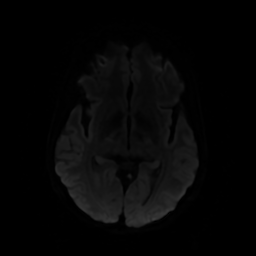
[im 56/100]
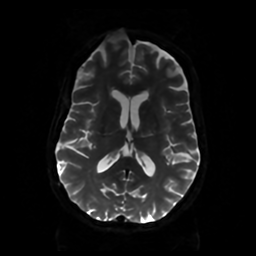
[im 67/100]
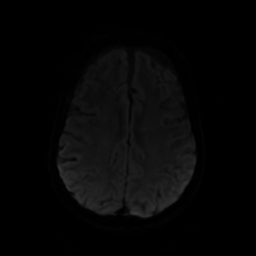
[im 89/100]
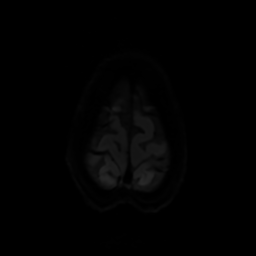
[im 100/100]
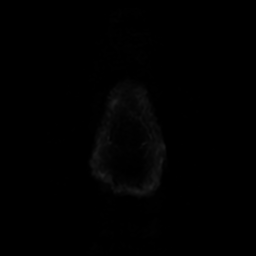

[Series 3: DWI · coronal · 4.0mm · 0.94mm/px · 7 of 78 slices shown (2 of 3)]
[im 1/78]
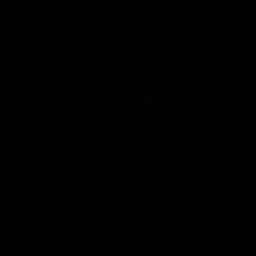
[im 13/78]
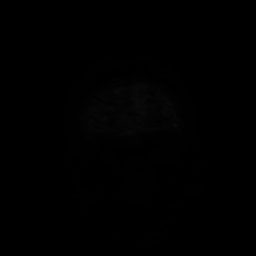
[im 26/78]
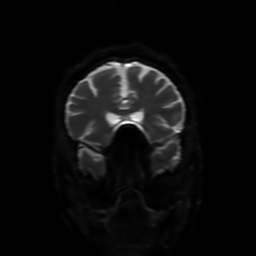
[im 39/78]
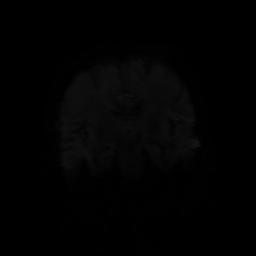
[im 52/78]
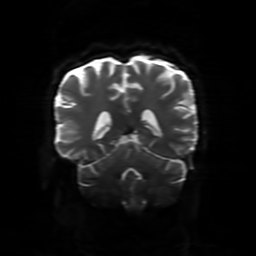
[im 65/78]
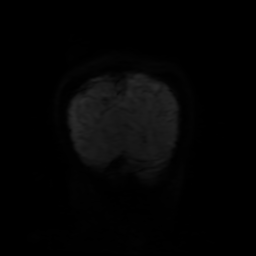
[im 78/78]
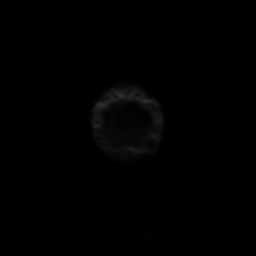

[Series 4: FLAIR · sagittal · 5.0mm · 0.23mm/px · 2 of 26 slices shown (1 of 2)]
[im 1/26]
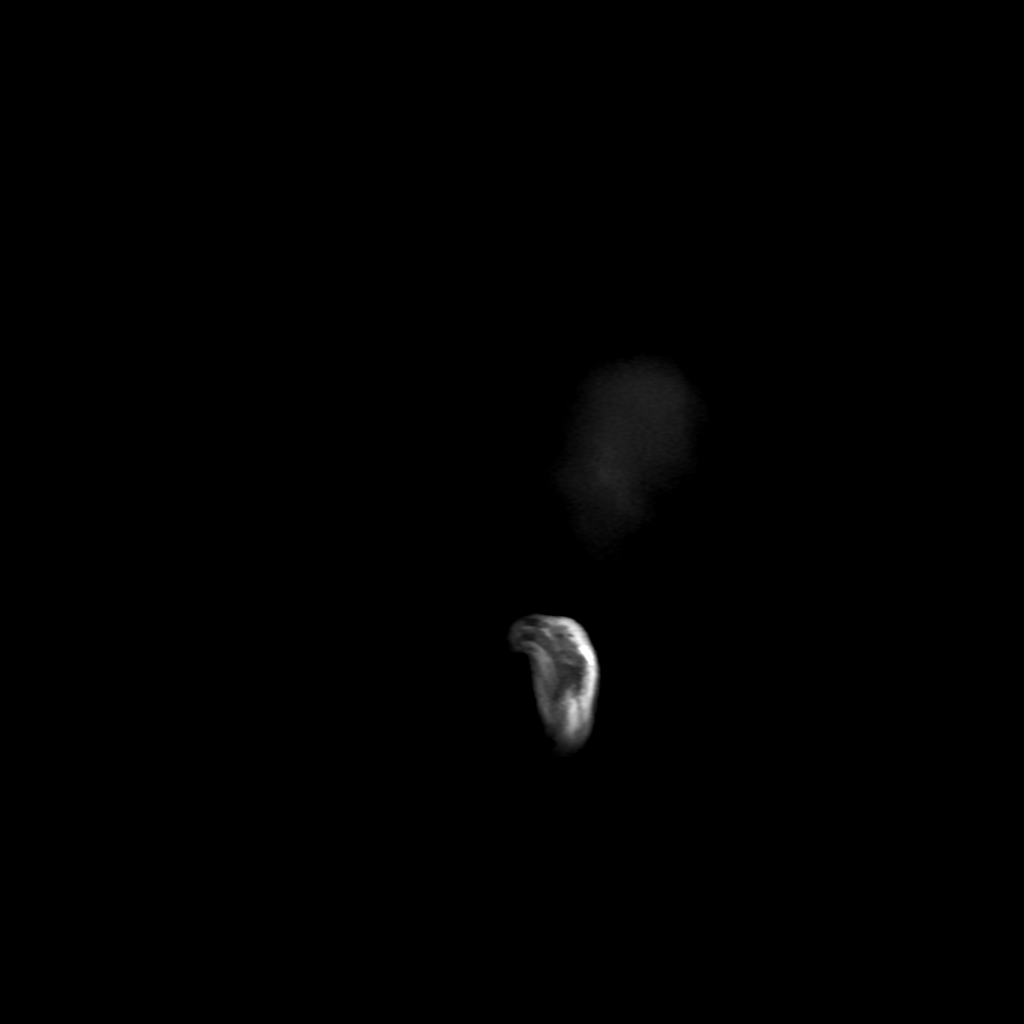
[im 26/26]
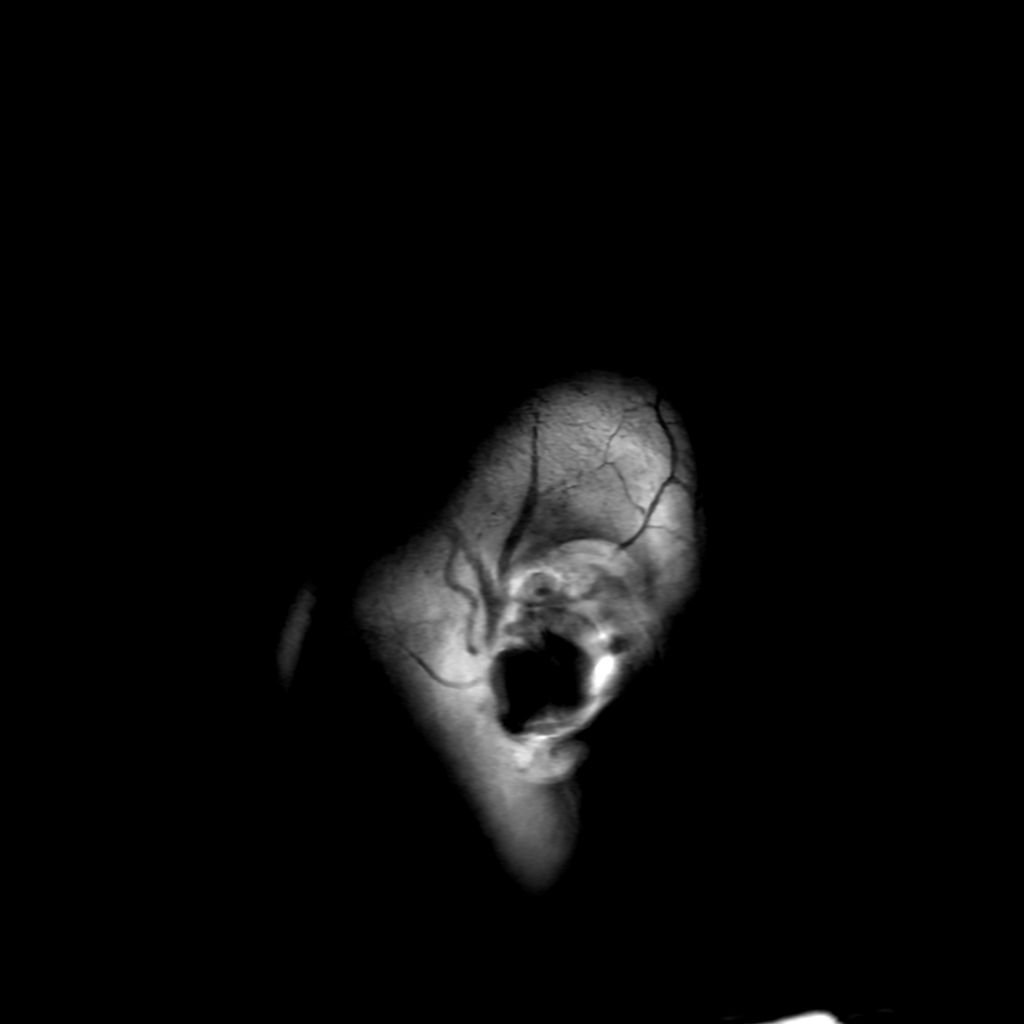

[Series 6: FLAIR · axial · 4.0mm · 0.45mm/px · z∈[-79,+70]mm · 3 of 35 slices shown (2 of 2)]
[im 1/35]
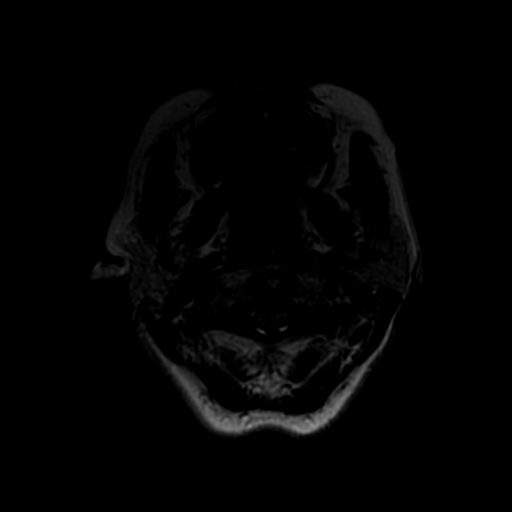
[im 18/35]
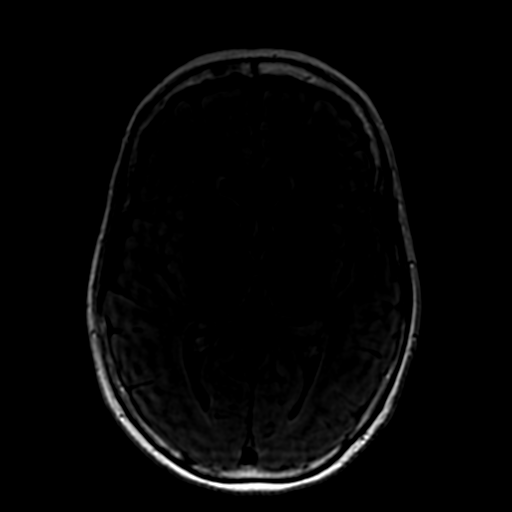
[im 35/35]
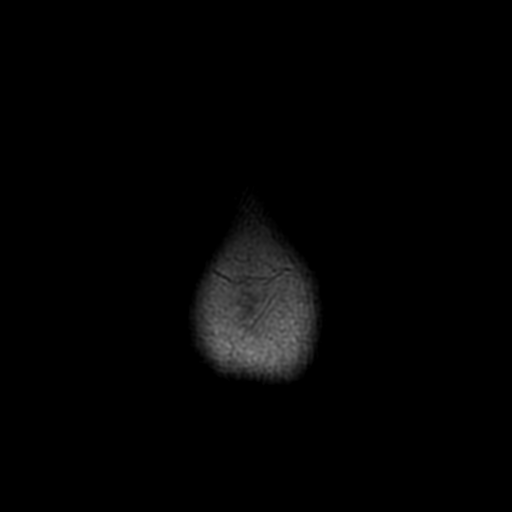

[Series 7: DWI · coronal · 4.0mm · 0.94mm/px · 7 of 77 slices shown (3 of 3)]
[im 1/77]
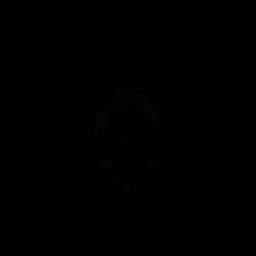
[im 13/77]
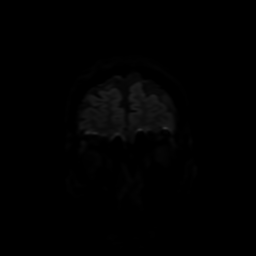
[im 26/77]
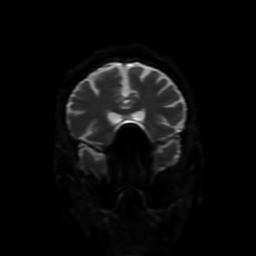
[im 39/77]
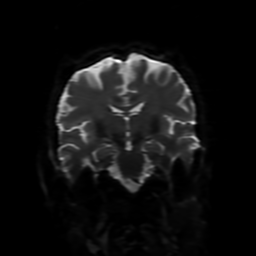
[im 51/77]
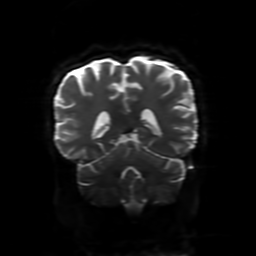
[im 64/77]
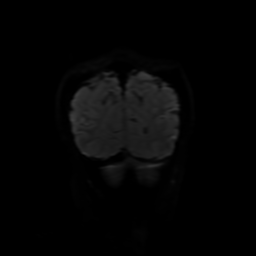
[im 77/77]
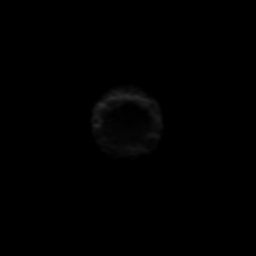

[Series 250: ADC · axial · 3.0mm · 0.94mm/px · z∈[-79,+66]mm · 4 of 50 slices shown (1 of 2)]
[im 1/50]
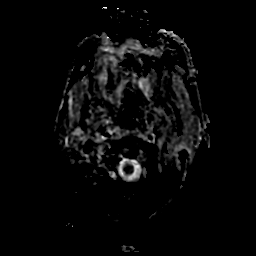
[im 17/50]
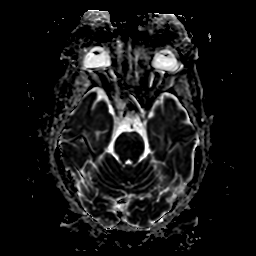
[im 33/50]
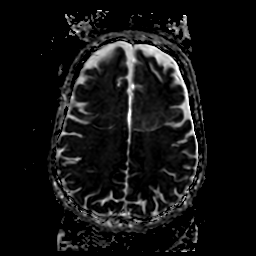
[im 50/50]
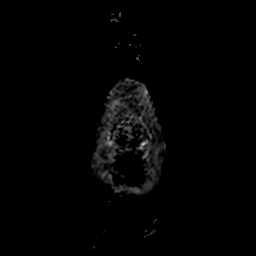

[Series 350: ADC · coronal · 4.0mm · 0.94mm/px · 3 of 39 slices shown (2 of 2)]
[im 1/39]
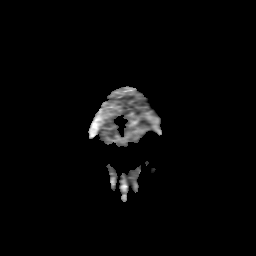
[im 20/39]
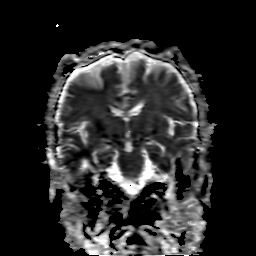
[im 39/39]
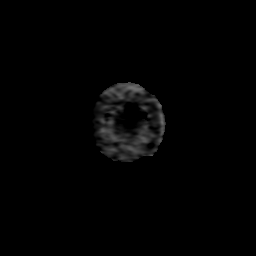

[34 of 48 positions shown; findings below may reference images not displayed]

FINDINGS: The examination had to be discontinued prior to completion due to
the patient's condition and movement. A complete noncontrast brain
MRI was obtained with exception of coronal T2 imaging. No IV
contrast was administered, and the requested MRV was not obtained.

Brain: There is no evidence of an acute infarct, intracranial
hemorrhage, mass, midline shift, or extra-axial fluid collection.
The ventricles and sulci are within normal limits for age. No age
advanced white matter disease is evident.

Vascular: Absent flow void in the left sigmoid sinus.

Skull and upper cervical spine: Unremarkable bone marrow signal.

Sinuses/Orbits: Unremarkable orbits. Minimal mucosal thick scar
asset mild scattered mucosal thickening in the paranasal sinuses. No
significant mastoid fluid.

Other: None.
IMPRESSION: 1. Incomplete, motion degraded examination.
2.  No acute infarct.
3. Abnormal appearance of the left sigmoid sinus corresponding to
suspected thrombosis on today's CTA.

## 2022-05-20 ENCOUNTER — Emergency Department
Admission: EM | Admit: 2022-05-20 | Discharge: 2022-05-21 | Disposition: A | Discharge status: Home / Self Care | Payer: 59 | Attending: Emergency Medicine | Admitting: Emergency Medicine

## 2022-05-20 ENCOUNTER — Encounter: Payer: Self-pay | Admitting: *Deleted

## 2022-05-20 ENCOUNTER — Other Ambulatory Visit: Payer: Self-pay

## 2022-05-20 DIAGNOSIS — F1010 Alcohol abuse, uncomplicated: Secondary | ICD-10-CM

## 2022-05-20 DIAGNOSIS — F1914 Other psychoactive substance abuse with psychoactive substance-induced mood disorder: Secondary | ICD-10-CM | POA: Diagnosis not present

## 2022-05-20 DIAGNOSIS — F17210 Nicotine dependence, cigarettes, uncomplicated: Secondary | ICD-10-CM | POA: Diagnosis not present

## 2022-05-20 DIAGNOSIS — Z720 Tobacco use: Secondary | ICD-10-CM | POA: Diagnosis present

## 2022-05-20 DIAGNOSIS — F32A Depression, unspecified: Secondary | ICD-10-CM | POA: Insufficient documentation

## 2022-05-20 DIAGNOSIS — F419 Anxiety disorder, unspecified: Secondary | ICD-10-CM | POA: Insufficient documentation

## 2022-05-20 DIAGNOSIS — F1020 Alcohol dependence, uncomplicated: Secondary | ICD-10-CM | POA: Diagnosis not present

## 2022-05-20 DIAGNOSIS — Z1152 Encounter for screening for COVID-19: Secondary | ICD-10-CM | POA: Insufficient documentation

## 2022-05-20 DIAGNOSIS — F19920 Other psychoactive substance use, unspecified with intoxication, uncomplicated: Secondary | ICD-10-CM | POA: Insufficient documentation

## 2022-05-20 DIAGNOSIS — G9341 Metabolic encephalopathy: Secondary | ICD-10-CM | POA: Diagnosis not present

## 2022-05-20 DIAGNOSIS — R45851 Suicidal ideations: Secondary | ICD-10-CM | POA: Insufficient documentation

## 2022-05-20 DIAGNOSIS — F10929 Alcohol use, unspecified with intoxication, unspecified: Secondary | ICD-10-CM | POA: Diagnosis present

## 2022-05-20 DIAGNOSIS — F1994 Other psychoactive substance use, unspecified with psychoactive substance-induced mood disorder: Secondary | ICD-10-CM | POA: Diagnosis not present

## 2022-05-20 LAB — CBC
HCT: 39.8 % (ref 36.0–46.0)
Hemoglobin: 13 g/dL (ref 12.0–15.0)
MCH: 33.6 pg (ref 26.0–34.0)
MCHC: 32.7 g/dL (ref 30.0–36.0)
MCV: 102.8 fL — ABNORMAL HIGH (ref 80.0–100.0)
Platelets: 251 10*3/uL (ref 150–400)
RBC: 3.87 MIL/uL (ref 3.87–5.11)
RDW: 13 % (ref 11.5–15.5)
WBC: 4.5 10*3/uL (ref 4.0–10.5)
nRBC: 0 % (ref 0.0–0.2)

## 2022-05-20 LAB — COMPREHENSIVE METABOLIC PANEL
ALT: 94 U/L — ABNORMAL HIGH (ref 0–44)
AST: 102 U/L — ABNORMAL HIGH (ref 15–41)
Albumin: 4.6 g/dL (ref 3.5–5.0)
Alkaline Phosphatase: 88 U/L (ref 38–126)
Anion gap: 15 (ref 5–15)
BUN: 10 mg/dL (ref 6–20)
CO2: 25 mmol/L (ref 22–32)
Calcium: 8.5 mg/dL — ABNORMAL LOW (ref 8.9–10.3)
Chloride: 103 mmol/L (ref 98–111)
Creatinine, Ser: 0.59 mg/dL (ref 0.44–1.00)
GFR, Estimated: >60 mL/min (ref >60)
Glucose, Bld: 91 mg/dL (ref 70–99)
Potassium: 3.4 mmol/L — ABNORMAL LOW (ref 3.5–5.1)
Sodium: 143 mmol/L (ref 135–145)
Total Bilirubin: 0.6 mg/dL (ref 0.3–1.2)
Total Protein: 7.8 g/dL (ref 6.5–8.1)

## 2022-05-20 LAB — ACETAMINOPHEN LEVEL: Acetaminophen (Tylenol), Serum: <10 ug/mL — ABNORMAL LOW (ref 10–30)

## 2022-05-20 LAB — ETHANOL: Alcohol, Ethyl (B): 382 mg/dL (ref <10)

## 2022-05-20 LAB — SALICYLATE LEVEL: Salicylate Lvl: <7 mg/dL — ABNORMAL LOW (ref 7.0–30.0)

## 2022-05-20 MED ORDER — THIAMINE MONONITRATE 100 MG PO TABS
100.0000 mg | ORAL_TABLET | Freq: Every day | ORAL | Status: DC
  Administered 2022-05-20 – 2022-05-21 (×2): 100 mg via ORAL
  Filled 2022-05-20 (×2): qty 1

## 2022-05-20 MED ORDER — LORAZEPAM 2 MG PO TABS: 0.0000 mg | ORAL_TABLET | Freq: Two times a day (BID) | ORAL | Status: DC

## 2022-05-20 MED ORDER — LORAZEPAM 2 MG PO TABS
0.0000 mg | ORAL_TABLET | Freq: Four times a day (QID) | ORAL | Status: DC
  Administered 2022-05-20 – 2022-05-21 (×2): 2 mg via ORAL
  Administered 2022-05-21 (×2): 1 mg via ORAL
  Filled 2022-05-20 (×4): qty 1

## 2022-05-20 MED ORDER — LORAZEPAM 2 MG/ML IJ SOLN: 0.0000 mg | Freq: Two times a day (BID) | INTRAMUSCULAR | Status: DC

## 2022-05-20 MED ORDER — LORAZEPAM 2 MG/ML IJ SOLN: 0.0000 mg | Freq: Four times a day (QID) | INTRAMUSCULAR | Status: DC

## 2022-05-20 MED ORDER — NICOTINE 21 MG/24HR TD PT24
21.0000 mg | MEDICATED_PATCH | Freq: Once | TRANSDERMAL | Status: DC
  Administered 2022-05-20: 21 mg via TRANSDERMAL
  Filled 2022-05-20: qty 1

## 2022-05-20 MED ORDER — THIAMINE HCL 100 MG/ML IJ SOLN
100.0000 mg | Freq: Every day | INTRAMUSCULAR | Status: DC
  Filled 2022-05-20: qty 1

## 2022-05-20 NOTE — ED Triage Notes (Signed)
Pt to triage this am.  Pt requesting alcohol detox.  Pt denies drug use.  Pt reports last etoh drink was this am.  Sister with pt.  Pt  reports SI and that she cut herself with a knife and fingernails.

## 2022-05-20 NOTE — ED Notes (Signed)
Patient's sister, Youlanda Mighty took patients belongings.

## 2022-05-20 NOTE — ED Notes (Signed)
Patient refused snack at this time, states that she is full.

## 2022-05-20 NOTE — ED Provider Notes (Signed)
[facility] Provider Note    Event Date/Time   First MD Initiated Contact with Patient 05/20/22 1642     (approximate)   History   Chief Complaint Psychiatric Evaluation   HPI  Nicole Macdonald is a 52 y.o. female with past medical history of alcohol abuse, bipolar disorder, and cerebral venous sinus thrombosis who presents to the ED for psychiatric evaluation.  Patient reports that she has been increasingly depressed with thoughts of suicide over the past few days.  She reports a plan to cut herself with a knife.  She admits to drinking alcohol on a daily basis, at least 1/5 of liquor daily with her last drink coming earlier today.  She reports a history of alcohol withdrawal including seizures in the past, denies any current symptoms of withdrawal.  She does report "seeing spirits" that will tell her to harm herself.  She states that her sister brought her to the ED for evaluation due to concern for the patient.     Physical Exam   Triage Vital Signs: ED Triage Vitals  Enc Vitals Group     BP 05/20/22 1522 (!) 151/95     Pulse Rate 05/20/22 1522 (!) 113     Resp 05/20/22 1522 (!) 22     Temp 05/20/22 1522 98.2 F (36.8 C)     Temp Source 05/20/22 1522 Oral     SpO2 05/20/22 1522 95 %     Weight 05/20/22 1517 140 lb (63.5 kg)     Height 05/20/22 1517  (1.6 m)     Head Circumference --      Peak Flow --      Pain Score 05/20/22 1516 0     Pain Loc --      Pain Edu? --      Excl. in GC? --     Most recent vital signs: Vitals:   05/20/22 1522 05/20/22 1728  BP: (!) 151/95 (!) 151/95  Pulse: (!) 113 (!) 113  Resp: (!) 22   Temp: 98.2 F (36.8 C)   SpO2: 95%     Constitutional: Alert and oriented. Eyes: Conjunctivae are normal. Head: Atraumatic. Nose: No congestion/rhinnorhea. Mouth/Throat: Mucous membranes are moist.  Cardiovascular: Normal rate, regular rhythm. Grossly normal heart sounds.  2+ radial pulses  bilaterally. Respiratory: Normal respiratory effort.  No retractions. Lungs CTAB. Gastrointestinal: Soft and nontender. No distention. Musculoskeletal: No lower extremity tenderness nor edema.  Neurologic:  Normal speech and language. No gross focal neurologic deficits are appreciated.    ED Results / Procedures / Treatments   Labs (all labs ordered are listed, but only abnormal results are displayed) Labs Reviewed  COMPREHENSIVE METABOLIC PANEL - Abnormal; Notable for the following components:      Result Value   Potassium 3.4 (*)    Calcium 8.5 (*)    AST 102 (*)    ALT 94 (*)    All other components within normal limits  ETHANOL - Abnormal; Notable for the following components:   Alcohol, Ethyl (B) 382 (*)    All other components within normal limits  SALICYLATE LEVEL - Abnormal; Notable for the following components:   Salicylate Lvl <7.0 (*)    All other components within normal limits  ACETAMINOPHEN LEVEL - Abnormal; Notable for the following components:   Acetaminophen (Tylenol), Serum <10 (*)    All other components within normal limits  CBC - Abnormal; Notable for the following components:   MCV 102.8 (*)  All other components within normal limits  URINE DRUG SCREEN, QUALITATIVE ([facility] ONLY)    PROCEDURES:  Critical Care performed: No  Procedures   MEDICATIONS ORDERED IN ED: Medications  LORazepam (ATIVAN) injection 0-4 mg (has no administration in time range)    Or  LORazepam (ATIVAN) tablet 0-4 mg (has no administration in time range)  LORazepam (ATIVAN) injection 0-4 mg (has no administration in time range)    Or  LORazepam (ATIVAN) tablet 0-4 mg (has no administration in time range)  thiamine (VITAMIN B1) tablet 100 mg (has no administration in time range)    Or  thiamine (VITAMIN B1) injection 100 mg (has no administration in time range)     IMPRESSION / MDM / ASSESSMENT AND PLAN / ED COURSE  I reviewed the triage vital signs and the nursing  notes.                              52 y.o. female with past medical history of alcohol withdrawal, bipolar disorder, and cerebral venous sinus thrombosis who presents to the ED complaining of suicidal ideation with plan to cut herself in the setting of significant daily alcohol consumption.  Patient's presentation is most consistent with acute presentation with potential threat to life or bodily function.  Differential diagnosis includes, but is not limited to, electrolyte abnormality, alcoholic ketoacidosis, AKI, hepatitis, alcohol withdrawal, depression, anxiety, suicidal ideation, psychosis.  Patient chronically ill-appearing but in no acute distress, vital signs remarkable for tachycardia but otherwise reassuring.  Patient still appears clinically intoxicated, no signs of alcohol withdrawal at this time.  Labs show no significant anemia, leukocytosis, electrolyte abnormality, or AKI.  She does have a mild transaminitis consistent with chronic alcohol abuse, no significant alcoholic hepatitis at this time.  Ethanol level is markedly elevated, will start on CIWA protocol and monitor for any signs of withdrawal.  Patient would benefit from psychiatric evaluation due to her SI with plan, was placed under IVC pending evaluation.  Patient denies any medical complaints and may be medically cleared at this time.  It appears that patient's Eliquis was stopped at her last heme-onc visit in December of last year.  The patient has been placed in psychiatric observation due to the need to provide a safe environment for the patient while obtaining psychiatric consultation and evaluation, as well as ongoing medical and medication management to treat the patient's condition.  The patient has been placed under full IVC at this time.       FINAL CLINICAL IMPRESSION(S) / ED DIAGNOSES   Final diagnoses:  Suicidal ideation  Alcohol abuse     Rx / DC Orders   ED Discharge Orders     None         Note:  This document was prepared using Dragon voice recognition software and may include unintentional dictation errors.   Chesley Noon, MD 05/20/22 [phone]

## 2022-05-20 NOTE — ED Notes (Signed)
Pt given dinner tray and drink; no other needs voiced at this time. 

## 2022-05-21 DIAGNOSIS — F1994 Other psychoactive substance use, unspecified with psychoactive substance-induced mood disorder: Secondary | ICD-10-CM

## 2022-05-21 DIAGNOSIS — F1010 Alcohol abuse, uncomplicated: Secondary | ICD-10-CM

## 2022-05-21 LAB — URINE DRUG SCREEN, QUALITATIVE (ARMC ONLY)
Amphetamines, Ur Screen: NOT DETECTED
Barbiturates, Ur Screen: NOT DETECTED
Benzodiazepine, Ur Scrn: POSITIVE — AB
Cannabinoid 50 Ng, Ur ~~LOC~~: NOT DETECTED
Cocaine Metabolite,Ur ~~LOC~~: NOT DETECTED
MDMA (Ecstasy)Ur Screen: NOT DETECTED
Methadone Scn, Ur: NOT DETECTED
Opiate, Ur Screen: NOT DETECTED
Phencyclidine (PCP) Ur S: NOT DETECTED
Tricyclic, Ur Screen: NOT DETECTED

## 2022-05-21 LAB — SARS CORONAVIRUS 2 BY RT PCR: SARS Coronavirus 2 by RT PCR: NEGATIVE

## 2022-05-21 NOTE — ED Notes (Signed)
Pt to BHU 4, calm cooperative, no complaints at this time

## 2022-05-21 NOTE — ED Notes (Signed)
NP at the bedside for pt re-evaluation

## 2022-05-21 NOTE — ED Notes (Signed)
Pt given breakfast tray and juice; VS obtained

## 2022-05-21 NOTE — Consult Note (Signed)
[facility] Psych ED Progress Note  05/21/2022 11:33 AM Nicole Macdonald  MRN:  [id number]   Method of visit?: Face to Face   Reassessment  Subjective:  "I don't think I want to go to inpatient detox or rehab."  On evaluation, patient is clinically sober. Speaks in clear, coherent sentences. Mild tremor. Patient denies suicidal or homicidal ideation. Denies auditory or visual hallucinations. Perceptions appear to be intact. Discussion regarding [facility] for alcohol detox/Rehab. Patient thought about it and declined at this time. She prefers discharge, as she reports she has to care for her mother. Patient's sister will come to pick her up and patient will will discuss alternative with her sister. Patient also contemplates other treatment programs. Patient does not appear to be ready to accept treatment for alcohol dependence at this time, though in contemplative stage.   Writer spoke with sister Nicole Macdonald [phone], who initially brought patient to the hospital yesterday.  Nicole Macdonald states that she contacted "[facility] [employer]", an alcohol treatment; she states they will come pick her up from the hospital. Discussion regarding the need for patient to initiate contact if she want to get treatment.      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   TIA (transient ischemic  attack)     Past Surgical History:  Procedure Laterality Date   COMPRESSION HIP SCREW Left 04/19/2017   Procedure: left hip cannulated screw pinning;  Surgeon: Christena Flake, MD;  Location: [facility] ORS;  Service: Orthopedics;  Laterality: Left;   denies     Family History:  Family History  Problem Relation Age of Onset   Ovarian cancer Mother    Diabetes Maternal Grandmother    Lung cancer Maternal Grandmother    Transient ischemic attack Neg Hx    Stroke Neg Hx    Family Psychiatric  History: mother , alcohol dependence Social History:  Social History   Substance and Sexual Activity  Alcohol Use Yes     Social History   Substance and Sexual Activity  Drug Use Not Currently   Types: Cocaine, Marijuana   Comment: 15 days ago last time cocaine, marijuana complted stop    Social History   Socioeconomic History   Marital status: Single    Spouse name: Not on file   Number of children: Not on file   Years of education: Not on file   Highest education level: Not on file  Occupational History   Not on file  Tobacco Use   Smoking status: Every Day    Packs/day: .5    Types: Cigarettes   Smokeless tobacco: Never  Vaping Use   Vaping Use: Every day  Substance and Sexual Activity   Alcohol use: Yes   Drug use: Not Currently    Types: Cocaine, Marijuana    Comment: 15 days ago last time cocaine, marijuana complted  stop   Sexual activity: Not Currently  Other Topics Concern   Not on file  Social History Narrative   ** Merged History Encounter **       Social Determinants of Health   Financial Resource Strain: Not on file  Food Insecurity: Not on file  Transportation Needs: Not on file  Physical Activity: Not on file  Stress: Not on file  Social Connections: Not on file    Sleep: Good  Appetite:  Fair  Current Medications: Current Facility-Administered Medications  Medication Dose Route Frequency Provider Last Rate Last Admin   LORazepam (ATIVAN) injection  0-4 mg  0-4 mg Intravenous Q6H Chesley Noon, MD       Or   LORazepam (ATIVAN) tablet 0-4 mg  0-4 mg Oral Q6H Chesley Noon, MD   1 mg at 05/21/22 1113   [START ON 05/23/2022] LORazepam (ATIVAN) injection 0-4 mg  0-4 mg Intravenous Q12H Chesley Noon, MD       Or   Melene Muller ON 05/23/2022] LORazepam (ATIVAN) tablet 0-4 mg  0-4 mg Oral Q12H Chesley Noon, MD       nicotine (NICODERM CQ - dosed in mg/24 hours) patch 21 mg  21 mg Transdermal Once Chesley Noon, MD   21 mg at 05/20/22 1942   thiamine (VITAMIN B1) tablet 100 mg  100 mg Oral Daily Chesley Noon, MD   100 mg at 05/21/22 1033   Or   thiamine (VITAMIN B1) injection 100 mg  100 mg Intravenous Daily Chesley Noon, MD       Current Outpatient Medications  Medication Sig Dispense Refill   albuterol (VENTOLIN HFA) 108 (90 Base) MCG/ACT inhaler Inhale 2 puffs into the lungs every 6 (six) hours as needed for wheezing or shortness of breath. 8 g 0   atomoxetine (STRATTERA) 60 MG capsule Take 60 mg by mouth at bedtime.     BIOTIN PO Take 1 tablet by mouth daily.     clonazePAM (KLONOPIN) 1 MG tablet Take 1 mg by mouth 3 (three) times daily.     FLUoxetine (PROZAC) 40 MG capsule Take 40 mg by mouth daily.     fluticasone (FLONASE) 50 MCG/ACT nasal spray Place 2 sprays into both nostrils daily.     gabapentin (NEURONTIN) 600 MG tablet Take 600 mg by mouth 4 (four) times daily.     propranolol (INDERAL) 10 MG tablet Take 10 mg by mouth.     vitamin B-12 (CYANOCOBALAMIN) 1000 MCG tablet Take 1,000 mcg by mouth daily.     amphetamine-dextroamphetamine (ADDERALL) 20 MG tablet Take 20 mg by mouth daily. (Patient not taking: Reported on 01/30/2022)     amphetamine-dextroamphetamine (ADDERALL) 30 MG tablet Take 1 tablet by mouth 2 (two) times daily. (Patient not taking: Reported on 05/20/2022)     apixaban (ELIQUIS) 5 MG TABS tablet Take 1 tablet (5 mg total) by mouth 2 (two) times daily. (Patient not taking: Reported on 05/21/2022) 60 tablet 0    clonazePAM (KLONOPIN) 2 MG tablet Take 2 mg by mouth 2 (two) times daily. (Patient not taking: Reported on 01/30/2022)     naltrexone (DEPADE) 50 MG tablet Take by mouth. (Patient not taking: Reported on 05/21/2022)     nicotine (NICODERM CQ - DOSED IN MG/24 HOURS) 21 mg/24hr patch Place 1 patch (21 mg total) onto the skin daily. (Patient not taking: Reported on 01/30/2022) 28 patch 0    Lab Results:  Results for orders placed or performed during the hospital encounter of 05/20/22 (from  the past 48 hour(s))  Comprehensive metabolic panel     Status: Abnormal   Collection Time: 05/20/22  3:23 PM  Result Value Ref Range   Sodium 143 135 - 145 mmol/L   Potassium 3.4 (L) 3.5 - 5.1 mmol/L   Chloride 103 98 - 111 mmol/L   CO2 25 22 - 32 mmol/L   Glucose, Bld 91 70 - 99 mg/dL    Comment: Glucose reference range applies only to samples taken after fasting for at least 8 hours.   BUN 10 6 - 20 mg/dL   Creatinine, Ser 6.57 0.44 - 1.00 mg/dL   Calcium 8.5 (L) 8.9 - 10.3 mg/dL   Total Protein 7.8 6.5 - 8.1 g/dL   Albumin 4.6 3.5 - 5.0 g/dL   AST 846 (H) 15 - 41 U/L   ALT 94 (H) 0 - 44 U/L   Alkaline Phosphatase 88 38 - 126 U/L   Total Bilirubin 0.6 0.3 - 1.2 mg/dL   GFR, Estimated >96 >29 mL/min    Comment: (NOTE) Calculated using the CKD-EPI Creatinine Equation (2021)    Anion gap 15 5 - 15    Comment: Performed at [facility], [address] [place], [place] [address]  Ethanol     Status: Abnormal   Collection Time: 05/20/22  3:23 PM  Result Value Ref Range   Alcohol, Ethyl (B) 382 (HH) <10 mg/dL    Comment: CRITICAL RESULT CALLED TO, READ BACK BY AND VERIFIED WITH  SAMANTHA HAMILTON 05/20/22 1556 MU (NOTE) Lowest detectable limit for serum alcohol is 10 mg/dL.  For medical purposes only. Performed at [facility], [address] [place], [place] [address]   Salicylate level     Status: Abnormal   Collection Time: 05/20/22  3:23 PM  Result Value Ref Range    Salicylate Lvl <7.0 (L) 7.0 - 30.0 mg/dL    Comment: Performed at [facility], [address] [place], [place] [address]  Acetaminophen level     Status: Abnormal   Collection Time: 05/20/22  3:23 PM  Result Value Ref Range   Acetaminophen (Tylenol), Serum <10 (L) 10 - 30 ug/mL    Comment: (NOTE) Therapeutic concentrations vary significantly. A range of 10-30 ug/mL  may be an effective concentration for many patients. However, some  are best treated at concentrations outside of this range. Acetaminophen concentrations >150 ug/mL at 4 hours after ingestion  and >50 ug/mL at 12 hours after ingestion are often associated with  toxic reactions.  Performed at [facility], [address] [place], [place] [address]   cbc     Status: Abnormal   Collection Time: 05/20/22  3:23 PM  Result Value Ref Range   WBC 4.5 4.0 - 10.5 K/uL   RBC 3.87 3.87 - 5.11 MIL/uL   Hemoglobin 13.0 12.0 - 15.0 g/dL   HCT 40.3 47.4 - 25.9 %   MCV 102.8 (H) 80.0 - 100.0 fL   MCH 33.6 26.0 - 34.0 pg   MCHC 32.7 30.0 - 36.0 g/dL   RDW 56.3 87.5 - 64.3 %   Platelets 251 150 - 400 K/uL   nRBC 0.0 0.0 - 0.2 %    Comment: Performed at [facility], [address], [place], [place] [address]  Urine Drug Screen, Qualitative     Status: Abnormal   Collection Time: 05/20/22 11:52 PM  Result Value Ref Range   Tricyclic, Ur Screen NONE DETECTED NONE DETECTED   Amphetamines, Ur Screen  NONE DETECTED NONE DETECTED   MDMA (Ecstasy)Ur Screen NONE DETECTED NONE DETECTED   Cocaine Metabolite,Ur [place] NONE DETECTED NONE DETECTED   Opiate, Ur Screen NONE DETECTED NONE DETECTED   Phencyclidine (PCP) Ur S NONE DETECTED NONE DETECTED   Cannabinoid 50 Ng, Ur [place] NONE DETECTED NONE DETECTED   Barbiturates, Ur Screen NONE DETECTED NONE DETECTED   Benzodiazepine, Ur Scrn POSITIVE (A) NONE DETECTED   Methadone Scn, Ur NONE DETECTED NONE DETECTED    Comment: (NOTE) Tricyclics + metabolites, urine     Cutoff 1000 ng/mL Amphetamines + metabolites, urine  Cutoff 1000 ng/mL MDMA (Ecstasy), urine              Cutoff 500 ng/mL Cocaine Metabolite, urine          Cutoff 300 ng/mL Opiate + metabolites, urine        Cutoff 300 ng/mL Phencyclidine (PCP), urine         Cutoff 25 ng/mL Cannabinoid, urine                 Cutoff 50 ng/mL Barbiturates + metabolites, urine  Cutoff 200 ng/mL Benzodiazepine, urine              Cutoff 200 ng/mL Methadone, urine                   Cutoff 300 ng/mL  The urine drug screen provides only a preliminary, unconfirmed analytical test result and should not be used for non-medical purposes. Clinical consideration and professional judgment should be applied to any positive drug screen result due to possible interfering substances. A more specific alternate chemical method must be used in order to obtain a confirmed analytical result. Gas chromatography / mass spectrometry (GC/MS) is the preferred confirm atory method. Performed at [facility], [address] [place], [place] [address]   SARS Coronavirus 2 by RT PCR (hospital order, performed in [facility] hospital lab) *cepheid single result test* Anterior Nasal Swab     Status: None   Collection Time: 05/20/22 11:53 PM   Specimen: Anterior Nasal Swab  Result Value Ref Range   SARS Coronavirus 2 by RT PCR NEGATIVE NEGATIVE    Comment: (NOTE) SARS-CoV-2 target nucleic acids are NOT DETECTED.  The SARS-CoV-2 RNA is generally detectable in upper and lower respiratory specimens during the acute phase of infection. The lowest concentration of SARS-CoV-2 viral copies this assay can detect is 250 copies / mL. A negative result does not preclude SARS-CoV-2 infection and should not be used as the sole basis for treatment or other patient management decisions.  A negative result may occur with improper specimen collection / handling, submission of specimen other than nasopharyngeal swab, presence of viral  mutation(s) within the areas targeted by this assay, and inadequate number of viral copies (<250 copies / mL). A negative result must be combined with clinical observations, patient history, and epidemiological information.  Fact Sheet for Patients:   RoadLapTop.co.za  Fact Sheet for Healthcare Providers: http://kim-miller.com/  This test is not yet approved or  cleared by the [place] FDA and has been authorized for detection and/or diagnosis of SARS-CoV-2 by FDA under an Emergency Use Authorization (EUA).  This EUA will remain in effect (meaning this test can be used) for the duration of the COVID-19 declaration under Section 564(b)(1) of the Act, 21 U.S.C. section 360bbb-3(b)(1), unless the authorization is terminated or revoked sooner.  Performed at [facility], [address] [place], [place] [address]  Blood Alcohol level:  Lab Results  Component Value Date   ETH 382 (HH) 05/20/2022   ETH 391 (HH) 09/20/2021    Physical Findings: AIMS:  , ,  ,  ,    CIWA:  CIWA-Ar Total: 6 COWS:     Musculoskeletal: Strength & Muscle Tone: within normal limits Gait & Station: normal Patient leans: N/A  Psychiatric Specialty Exam:  Presentation  General Appearance:  Appropriate for Environment  Eye Contact: Good  Speech: Clear and Coherent  Speech Volume: Normal  Handedness: Right   Mood and Affect  Mood: Euthymic  Affect: Appropriate; Congruent   Thought Process  Thought Processes: Coherent  Descriptions of Associations:Intact  Orientation:Full (Time, Place and Person)  Thought Content:WDL  History of Schizophrenia/Schizoaffective disorder:No  Duration of Psychotic Symptoms:No data recorded Hallucinations:Hallucinations: None  Ideas of Reference:None  Suicidal Thoughts:Suicidal Thoughts: No  Homicidal Thoughts:Homicidal Thoughts: No   Sensorium  Memory: Immediate  Good  Judgment: Fair  Insight: Fair   Executive Functions  Concentration: Good  Attention Span: Good  Recall: Good  Fund of Knowledge: Good  Language: Good   Psychomotor Activity  Psychomotor Activity: Psychomotor Activity: Normal   Assets  Assets: Desire for Improvement; Financial Resources/Insurance; Housing; Social Support; Resilience; Physical Health   Sleep  Sleep: Sleep: Fair    Physical Exam: Physical Exam Vitals and nursing note reviewed.  HENT:     Head: Normocephalic.     Nose: No congestion or rhinorrhea.  Eyes:     General:        Right eye: No discharge.        Left eye: No discharge.  Cardiovascular:     Rate and Rhythm: Normal rate.  Pulmonary:     Effort: Pulmonary effort is normal.  Musculoskeletal:        General: Normal range of motion.     Cervical back: Normal range of motion.  Skin:    General: Skin is dry.  Neurological:     Mental Status: She is alert and oriented to person, place, and time.  Psychiatric:        Attention and Perception: Attention normal.        Mood and Affect: Mood normal.        Speech: Speech normal.        Behavior: Behavior normal.        Thought Content: Thought content normal. Thought content is not paranoid or delusional. Thought content does not include homicidal or suicidal ideation.        Cognition and Memory: Cognition normal.        Judgment: Judgment normal.    Review of Systems  Constitutional: Negative.   Respiratory: Negative.    Cardiovascular: Negative.   Musculoskeletal: Negative.   Skin: Negative.   Neurological:  Positive for tremors (mild).  Psychiatric/Behavioral:  Positive for substance abuse. Negative for depression, hallucinations, memory loss and suicidal ideas. The patient is not nervous/anxious and does not have insomnia.    Blood pressure 137/79, pulse 99, temperature 98.1 F (36.7 C), temperature source Oral, resp. rate 18, height 5\' 3"  (1.6 m), weight 63.5 kg,  SpO2 98 %. Body mass index is 24.8 kg/m.  Treatment Plan Summary: Plan Patient was given number for [facility] to call for an intake. Patient decided she did not want to do that now. She elected to be discharged home and will talk "face-to-face" with her family regarding the ability of a family member to care for patient's mother if patient goes  to inpatient rehab.   Patient denies SI/HI/AVH. She is clinically sober. Has been on CIWA and received Ativan this morning. Reviewed with Dr. Derrill Kay. OK for patient to discharge.   Vanetta Mulders, NP 05/21/2022, 11:33 AM

## 2022-05-21 NOTE — Consult Note (Signed)
[facility] Face-to-Face Psychiatry Consult   Reason for Consult: Psychiatric Evaluation  Referring Physician: Dr. Larinda Buttery Patient Identification: Nicole Macdonald MRN:  [id number] Principal Diagnosis: <principal problem not specified> Diagnosis:  Active Problems:   Alcohol abuse   Substance induced mood disorder (HCC)   Alcohol intoxication   Anxiety and depression   Tobacco abuse   Alcohol use disorder, severe, dependence   Acute metabolic encephalopathy   Total Time spent with patient: 1 hour  Subjective: "I have never had a day that I did not drink."  Nicole Macdonald is a 52 y.o. female patient presented the [facility] Emergency Department voluntarily with a history of alcohol abuse, bipolar disorder, and cerebral venous sinus thrombosis, seeking psychiatric evaluation. She expressed suicidal ideation is decreasing and the patient shared not wanting to harm to harm herself .She admitted to daily alcohol consumption, consuming at least a fifth of liquor daily, with her last drink earlier that day. While she has a history of alcohol withdrawal, including seizures, she denied any current withdrawal symptoms. She also reported experiencing visual hallucinations of "spirits" instructing her to harm herself. Concerned for her well-being, her sister brought her to the ED for evaluation. Bal is 382 mg/dl.  During evaluation, conducted by this provider and in consultation with Dr. Larinda Buttery on 05/20/2022, the patient remained alert, oriented x 4, calm, and cooperative, with mood-congruent affect. She did not respond to internal or external stimuli and did not present with delusional thinking, auditory or visual hallucinations, suicidal, homicidal, or self-harm ideations, nor psychotic or paranoid behaviors. The patient will be kept under observation overnight, with plans for reassessment in the morning to determine if psychiatric inpatient admission criteria were met or if she could be discharged  home.  Disposition: The patient remained under observation overnight and will be reassessed in the a.m. to determine if she meets the criteria for psychiatric inpatient admission; she could be discharged home.  HPI: Per Dr. Larinda Buttery, Stann Mainland Ruderman is a 52 y.o. female with past medical history of alcohol abuse, bipolar disorder, and cerebral venous sinus thrombosis who presents to the ED for psychiatric evaluation.  Patient reports that she has been increasingly depressed with thoughts of suicide over the past few days.  She reports a plan to cut herself with a knife.  She admits to drinking alcohol on a daily basis, at least 1/5 of liquor daily with her last drink coming earlier today.  She reports a history of alcohol withdrawal including seizures in the past, denies any current symptoms of withdrawal.  She does report "seeing spirits" that will tell her to harm herself.  She states that her sister brought her to the ED for evaluation due to concern for the patient.    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   TIA (transient ischemic attack)     Past Surgical History:  Procedure Laterality Date   COMPRESSION HIP SCREW Left 04/19/2017   Procedure: left hip cannulated screw pinning;  Surgeon: Christena Flake, MD;  Location: [facility] ORS;  Service: Orthopedics;  Laterality: Left;   denies     Family History:  Family History  Problem Relation Age of Onset   Ovarian cancer Mother    Diabetes Maternal Grandmother    Lung cancer Maternal Grandmother    Transient  ischemic attack Neg Hx    Stroke Neg Hx    Family Psychiatric  History:  Social History:  Social History   Substance and Sexual Activity  Alcohol Use Yes     Social History   Substance and Sexual Activity  Drug Use Not Currently   Types: Cocaine, Marijuana   Comment: 15 days ago last time cocaine, marijuana complted stop    Social History   Socioeconomic History   Marital status: Single    Spouse name: Not on file   Number of children: Not on file   Years of education: Not on file   Highest education level: Not on file  Occupational History   Not on file  Tobacco Use   Smoking status: Every Day    Packs/day: .5    Types: Cigarettes   Smokeless tobacco: Never  Vaping Use   Vaping Use: Every day  Substance and Sexual Activity   Alcohol use: Yes   Drug use: Not Currently    Types: Cocaine, Marijuana    Comment: 15 days ago last time cocaine, marijuana complted stop   Sexual activity: Not Currently  Other Topics Concern   Not on file  Social History Narrative   ** Merged History Encounter **       Social Determinants of Health   Financial Resource Strain: Not on file  Food Insecurity: Not on file  Transportation Needs: Not on file  Physical Activity: Not on file  Stress: Not on file  Social Connections: Not on file   Additional Social History:    Allergies:   Allergies  Allergen Reactions   Codeine Hives    Labs:  Results for orders placed or performed during the hospital encounter of 05/20/22 (from the past 48 hour(s))  Comprehensive metabolic panel     Status: Abnormal   Collection Time: 05/20/22  3:23 PM  Result Value Ref Range   Sodium 143 135 - 145 mmol/L   Potassium 3.4 (L) 3.5 - 5.1 mmol/L   Chloride 103 98 - 111 mmol/L   CO2 25 22 - 32 mmol/L   Glucose, Bld 91 70 - 99 mg/dL    Comment: Glucose reference range applies only to samples taken after fasting for at least 8 hours.   BUN 10 6 - 20 mg/dL   Creatinine, Ser 8.29 0.44 - 1.00 mg/dL    Calcium 8.5 (L) 8.9 - 10.3 mg/dL   Total Protein 7.8 6.5 - 8.1 g/dL   Albumin 4.6 3.5 - 5.0 g/dL   AST 562 (H) 15 - 41 U/L   ALT 94 (H) 0 - 44 U/L   Alkaline Phosphatase 88 38 - 126 U/L   Total Bilirubin 0.6 0.3 - 1.2 mg/dL   GFR, Estimated >13 >08 mL/min    Comment: (NOTE) Calculated using the CKD-EPI Creatinine Equation (2021)    Anion gap 15 5 - 15    Comment: Performed at [facility], [address] [place], [place] [address]  Ethanol     Status: Abnormal   Collection Time: 05/20/22  3:23 PM  Result Value Ref Range   Alcohol, Ethyl (B) 382 (HH) <10 mg/dL    Comment: CRITICAL RESULT CALLED TO, READ  BACK BY AND VERIFIED WITH  SAMANTHA HAMILTON 05/20/22 1556 MU (NOTE) Lowest detectable limit for serum alcohol is 10 mg/dL.  For medical purposes only. Performed at [facility], [address] [place], [place] [address]   Salicylate level     Status: Abnormal   Collection Time: 05/20/22  3:23 PM  Result Value Ref Range   Salicylate Lvl <7.0 (L) 7.0 - 30.0 mg/dL    Comment: Performed at [facility], [address] [place], [place] [address]  Acetaminophen level     Status: Abnormal   Collection Time: 05/20/22  3:23 PM  Result Value Ref Range   Acetaminophen (Tylenol), Serum <10 (L) 10 - 30 ug/mL    Comment: (NOTE) Therapeutic concentrations vary significantly. A range of 10-30 ug/mL  may be an effective concentration for many patients. However, some  are best treated at concentrations outside of this range. Acetaminophen concentrations >150 ug/mL at 4 hours after ingestion  and >50 ug/mL at 12 hours after ingestion are often associated with  toxic reactions.  Performed at [facility], [address] [place], [place] [address]   cbc     Status: Abnormal   Collection Time: 05/20/22  3:23 PM  Result Value Ref Range   WBC 4.5 4.0 - 10.5 K/uL   RBC 3.87 3.87 - 5.11 MIL/uL   Hemoglobin 13.0 12.0 - 15.0 g/dL   HCT 91.4 78.2 -  95.6 %   MCV 102.8 (H) 80.0 - 100.0 fL   MCH 33.6 26.0 - 34.0 pg   MCHC 32.7 30.0 - 36.0 g/dL   RDW 21.3 08.6 - 57.8 %   Platelets 251 150 - 400 K/uL   nRBC 0.0 0.0 - 0.2 %    Comment: Performed at [facility], [address] [place], [place] [address]  SARS Coronavirus 2 by RT PCR (hospital order, performed in [facility] hospital lab) *cepheid single result test* Anterior Nasal Swab     Status: None   Collection Time: 05/20/22 11:53 PM   Specimen: Anterior Nasal Swab  Result Value Ref Range   SARS Coronavirus 2 by RT PCR NEGATIVE NEGATIVE    Comment: (NOTE) SARS-CoV-2 target nucleic acids are NOT DETECTED.  The SARS-CoV-2 RNA is generally detectable in upper and lower respiratory specimens during the acute phase of infection. The lowest concentration of SARS-CoV-2 viral copies this assay can detect is 250 copies / mL. A negative result does not preclude SARS-CoV-2 infection and should not be used as the sole basis for treatment or other patient management decisions.  A negative result may occur with improper specimen collection / handling, submission of specimen other than nasopharyngeal swab, presence of viral mutation(s) within the areas targeted by this assay, and inadequate number of viral copies (<250 copies / mL). A negative result must be combined with clinical observations, patient history, and epidemiological information.  Fact Sheet for Patients:   RoadLapTop.co.za  Fact Sheet for Healthcare Providers: http://kim-miller.com/  This test is not yet approved or  cleared by the [place] FDA and has been authorized for detection and/or diagnosis of SARS-CoV-2 by FDA under an Emergency Use Authorization (EUA).  This EUA will remain in effect (meaning this test can be used) for the duration of the COVID-19 declaration under Section 564(b)(1) of the Act, 21 U.S.C. section 360bbb-3(b)(1), unless the authorization  is terminated or revoked sooner.  Performed at [facility], [address], [place], [place] [address]     Current Facility-Administered  Medications  Medication Dose Route Frequency Provider Last Rate Last Admin   LORazepam (ATIVAN) injection 0-4 mg  0-4 mg Intravenous Q6H Chesley Noon, MD       Or   LORazepam (ATIVAN) tablet 0-4 mg  0-4 mg Oral Q6H Chesley Noon, MD   2 mg at 05/21/22 0019   [START ON 05/23/2022] LORazepam (ATIVAN) injection 0-4 mg  0-4 mg Intravenous Q12H Chesley Noon, MD       Or   Melene Muller ON 05/23/2022] LORazepam (ATIVAN) tablet 0-4 mg  0-4 mg Oral Q12H Chesley Noon, MD       nicotine (NICODERM CQ - dosed in mg/24 hours) patch 21 mg  21 mg Transdermal Once Chesley Noon, MD   21 mg at 05/20/22 1942   thiamine (VITAMIN B1) tablet 100 mg  100 mg Oral Daily Chesley Noon, MD   100 mg at 05/20/22 1743   Or   thiamine (VITAMIN B1) injection 100 mg  100 mg Intravenous Daily Chesley Noon, MD       Current Outpatient Medications  Medication Sig Dispense Refill   atomoxetine (STRATTERA) 60 MG capsule Take 60 mg by mouth at bedtime.     clonazePAM (KLONOPIN) 1 MG tablet Take 1 mg by mouth 3 (three) times daily.     gabapentin (NEURONTIN) 600 MG tablet Take 600 mg by mouth 4 (four) times daily.     propranolol (INDERAL) 10 MG tablet Take 10 mg by mouth.     albuterol (VENTOLIN HFA) 108 (90 Base) MCG/ACT inhaler Inhale 2 puffs into the lungs every 6 (six) hours as needed for wheezing or shortness of breath. 8 g 0   amphetamine-dextroamphetamine (ADDERALL) 20 MG tablet Take 20 mg by mouth daily. (Patient not taking: Reported on 01/30/2022)     amphetamine-dextroamphetamine (ADDERALL) 30 MG tablet Take 1 tablet by mouth 2 (two) times daily. (Patient not taking: Reported on 05/20/2022)     apixaban (ELIQUIS) 5 MG TABS tablet Take 1 tablet (5 mg total) by mouth 2 (two) times daily. 60 tablet 0   BIOTIN PO Take 1 tablet by mouth daily.     clonazePAM  (KLONOPIN) 2 MG tablet Take 2 mg by mouth 2 (two) times daily. (Patient not taking: Reported on 01/30/2022)     FLUoxetine (PROZAC) 40 MG capsule Take 40 mg by mouth daily.     fluticasone (FLONASE) 50 MCG/ACT nasal spray Place 2 sprays into both nostrils daily.     naltrexone (DEPADE) 50 MG tablet Take by mouth.     nicotine (NICODERM CQ - DOSED IN MG/24 HOURS) 21 mg/24hr patch Place 1 patch (21 mg total) onto the skin daily. (Patient not taking: Reported on 01/30/2022) 28 patch 0   vitamin B-12 (CYANOCOBALAMIN) 1000 MCG tablet Take 1,000 mcg by mouth daily.      Musculoskeletal: Strength & Muscle Tone: within normal limits Gait & Station: normal Patient leans: N/A  Psychiatric Specialty Exam:  Presentation  General Appearance:  Appropriate for Environment  Eye Contact: Fleeting  Speech: Garbled  Speech Volume: Decreased  Handedness: Right   Mood and Affect  Mood: Anxious  Affect: Depressed; Flat; Labile; Blunt; Inappropriate   Thought Process  Thought Processes: Goal Directed  Descriptions of Associations:Loose  Orientation:Partial  Thought Content:Scattered  History of Schizophrenia/Schizoaffective disorder:No  Duration of Psychotic Symptoms:No data recorded Hallucinations:Hallucinations: None  Ideas of Reference:None  Suicidal Thoughts:Suicidal Thoughts: No  Homicidal Thoughts:Homicidal Thoughts: No   Sensorium  Memory: Immediate Poor; Recent Poor; Remote Poor  Judgment: Poor  Insight: Poor   Executive Functions  Concentration: Poor  Attention Span: Poor  Recall: Poor  Fund of Knowledge: Poor  Language: Poor   Psychomotor Activity  Psychomotor Activity: Psychomotor Activity: Normal   Assets  Assets: Communication Skills; Desire for Improvement; Resilience; Social Support   Sleep  Sleep: Sleep: Fair   Physical Exam: Physical Exam Vitals and nursing note reviewed.  Constitutional:      Appearance: Normal  appearance. She is normal weight.  HENT:     Head: Normocephalic and atraumatic.     Right Ear: External ear normal.     Left Ear: External ear normal.     Nose: Nose normal.     Mouth/Throat:     Mouth: Mucous membranes are moist.  Cardiovascular:     Rate and Rhythm: Normal rate.     Pulses: Normal pulses.  Pulmonary:     Effort: Pulmonary effort is normal.  Musculoskeletal:        General: Normal range of motion.     Cervical back: Normal range of motion and neck supple.  Neurological:     General: No focal deficit present.     Mental Status: She is alert and oriented to person, place, and time.  Psychiatric:        Mood and Affect: Mood normal.    ROS Blood pressure 125/86, pulse (!) 105, temperature 98.2 F (36.8 C), temperature source Oral, resp. rate 18, height  (1.6 m), weight 63.5 kg, SpO2 95 %. Body mass index is 24.8 kg/m.  Treatment Plan Summary: Daily contact with patient to assess and evaluate symptoms and progress in treatment and Plan   The patient remained under observation overnight and will be reassessed in the a.m. to determine if she meets the criteria for psychiatric inpatient admission; she could be discharged home.  Disposition:    The patient remained under observation overnight and will be reassessed in the a.m. to determine if she meets the criteria for psychiatric inpatient admission; she could be discharged home.  Gillermo Murdoch, NP 05/21/2022 1:42 AM

## 2022-05-21 NOTE — ED Notes (Signed)
IVC papers  rescinded  per  Sallye Ober  NP  informed  RN  Sue Lush

## 2022-05-21 NOTE — Discharge Instructions (Signed)
Please seek medical attention for any high fevers, chest pain, shortness of breath, change in behavior, persistent vomiting, bloody stool or any other new or concerning symptoms.  

## 2022-05-21 NOTE — BH Assessment (Signed)
Comprehensive Clinical Assessment (CCA) Screening, Triage and Referral Note  05/21/2022 Nicole Macdonald [id number] Recommendations for Services/Supports/Treatments: Consulted with Madaline Brilliant. NP, who recommended pt be observed overnight and reassessed in the AM.  Marcelino Duster L. Boggess is a 52 year old, English speaking, Caucasian female with a history of Anxiety, Depression, substance induced mood disorder, and alcohol use disorder, severe. Pt presented to [facility] ED voluntarily requesting detox and endorsing SI.  On assessment the pt. admitted that she uses alcohol, daily or when there is access. The pt reported having no hx of substance abuse treatment in the past. Pt's longest time she's been sober or the amount of drinking is unknown. The pt. unable to confirm or deny withdrawal symptoms. The pt. had limited insight and judgement as she admitted that her substance use is problematic. The pt. is not connected to any services. Pt was anxious and had tremors. Pt was not responding to internal/external stimuli. Pt mumbled with, slurred speech; however, her thoughts were linear. Pt was oriented x4. Pt presented with a depressed mood; affect was congruent. Pt had a relatively unremarkable appearance. Pt's BAL was 382 on arrival. UDS + for benzos. Pt admitted that she has auditory hallucinations of "people".  Pt denied current SI/HI/V/H.   Chief Complaint:  Chief Complaint  Patient presents with   Psychiatric Evaluation   Visit Diagnosis: Alcohol abuse   Substance induced mood disorder (HCC)   Alcohol intoxication   Anxiety and depression   Tobacco abuse   Alcohol use disorder, severe, dependence   Acute metabolic encephalopathy   Patient Reported Information How did you hear about [place]? Self  What Is the Reason for Your Visit/Call Today? Pt to triage this am.  Pt requesting alcohol detox.  Pt denies drug use.  Pt reports last etoh drink was this am.  Sister with pt.  Pt  reports SI and that she cut  herself with a knife and fingernails.  How Long Has This Been Causing You Problems? > than 6 months  What Do You Feel Would Help You the Most Today? Treatment for Depression or other mood problem; Alcohol or Drug Use Treatment   Have You Recently Had Any Thoughts About Hurting Yourself? Yes  Are You Planning to Commit Suicide/Harm Yourself At This time? No   Have you Recently Had Thoughts About Hurting Someone Karolee Ohs? No  Are You Planning to Harm Someone at This Time? No  Explanation: No data recorded  Have You Used Any Alcohol or Drugs in the Past 24 Hours? Yes  How Long Ago Did You Use Drugs or Alcohol? No data recorded What Did You Use and How Much? Unknown amount of alcohol   Do You Currently Have a Therapist/Psychiatrist? No  Name of Therapist/Psychiatrist: n/a   Have You Been Recently Discharged From Any Office Practice or Programs? No  Explanation of Discharge From Practice/Program: n/a    CCA Screening Triage Referral Assessment Type of Contact: Face-to-Face  Telemedicine Service Delivery:   Is this Initial or Reassessment?   Date Telepsych consult ordered in [facility]:    Time Telepsych consult ordered in [facility]:    Location of Assessment: [facility] ED  Provider Location: [facility] ED    Collateral Involvement: None provided   Does Patient Have a Court Appointed Legal Guardian? No data recorded Name and Contact of Legal Guardian: No data recorded If Minor and Not Living with Parent(s), Who has Custody? n/a  Is CPS involved or ever been involved? Never  Is APS involved or ever been  involved? Never   Patient Determined To Be At Risk for Harm To Self or Others Based on Review of Patient Reported Information or Presenting Complaint? No  Method: No Plan  Availability of Means: No access or NA  Intent: Vague intent or NA  Notification Required: No need or identified person  Additional Information for Danger to Others Potential: -- (n/a)  Additional Comments for  Danger to Others Potential: n/a  Are There Guns or Other Weapons in Your Home? No  Types of Guns/Weapons: n/a  Are These Weapons Safely Secured?                            No  Who Could Verify You Are Able To Have These Secured: n/a  Do You Have any Outstanding Charges, Pending Court Dates, Parole/Probation? None reported  Contacted To Inform of Risk of Harm To Self or Others: -- (n/a)   Does Patient Present under Involuntary Commitment? No    County of Residence: [place]   Patient Currently Receiving the Following Services: Medication Management   Determination of Need: Emergent (2 hours)   Options For Referral: ED Visit   Discharge Disposition:     Kepler Mccabe R Eh Sesay, LCAS

## 2022-05-21 NOTE — ED Notes (Signed)
Pt awake, requests water, provided. Pt complains of tremors but no other ill feelings

## 2022-05-21 NOTE — ED Notes (Addendum)
Pt given phone to call her sister for a ride home and to call [facility] to be screened for discharge resources

## 2022-06-19 IMAGING — CT CT CHEST W/O CM
2 of 4 series · 15 of 36 positions shown, 18 images · non-contrast
Comparison: CT venogram head May 09, 2021.

CLINICAL DATA: Acute thrombosis of left internal jugular vein,
assess left internal jugular vein.



[Series 2: thorax · axial · 0.67mm/px · z∈[-155,+107]mm · 12 of 155 slices shown, 15 images]
[im 12/155  mediastinal]
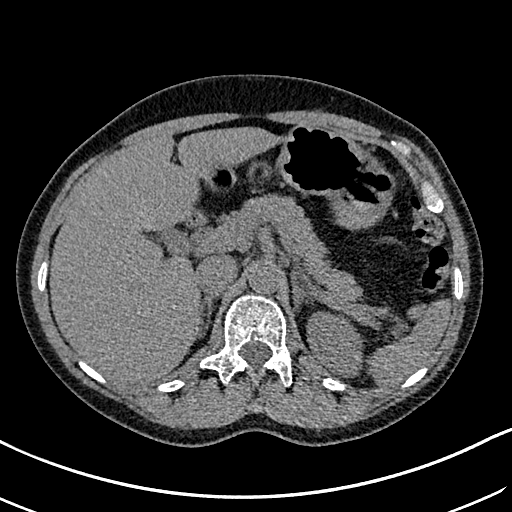
[im 12/155  lung]
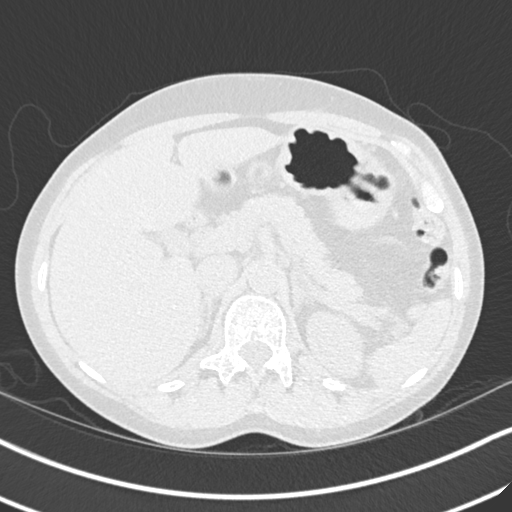
[im 24/155  lung]
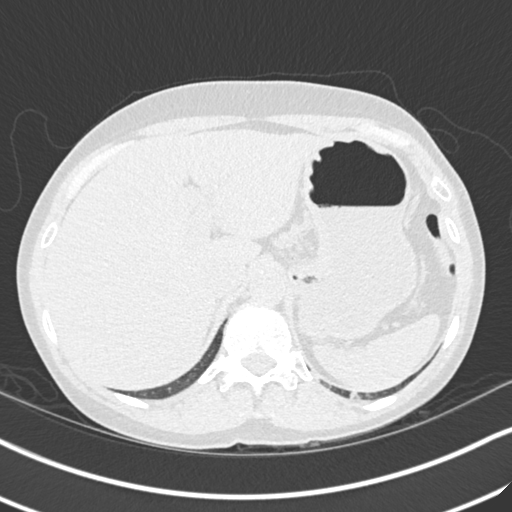
[im 36/155  lung]
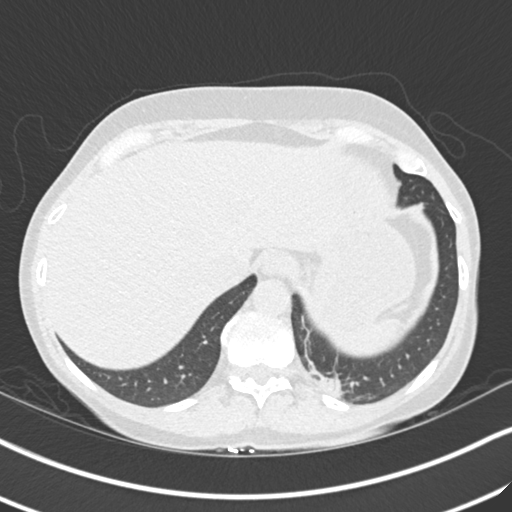
[im 48/155  lung]
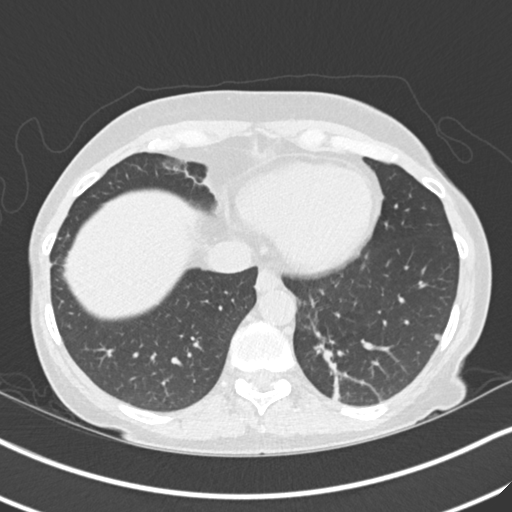
[im 60/155  mediastinal]
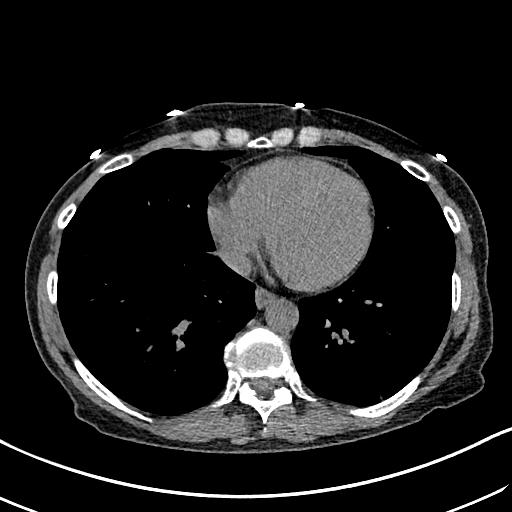
[im 60/155  lung]
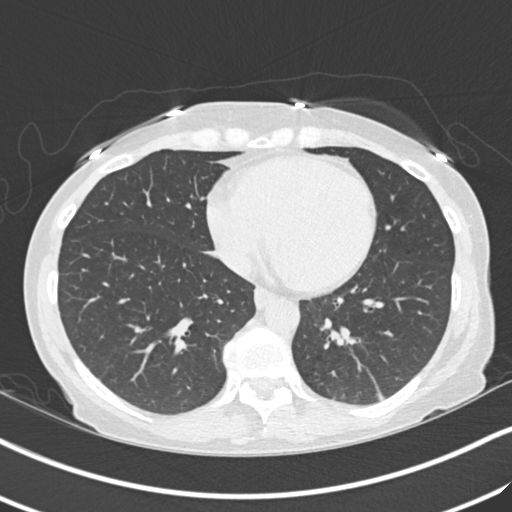
[im 72/155  lung]
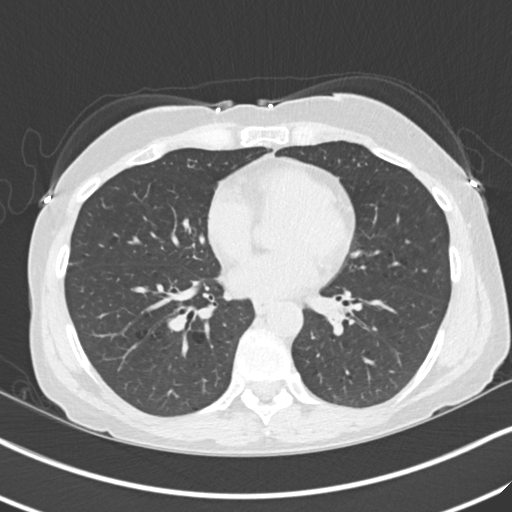
[im 83/155  lung]
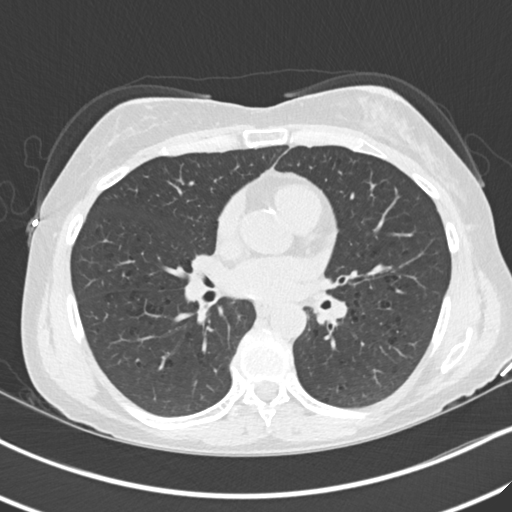
[im 95/155  lung]
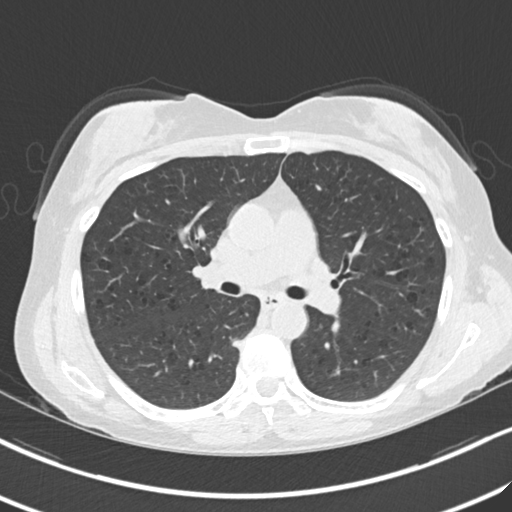
[im 107/155  mediastinal]
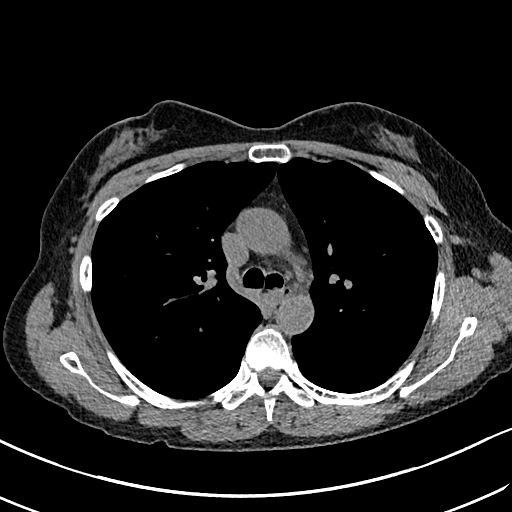
[im 107/155  lung]
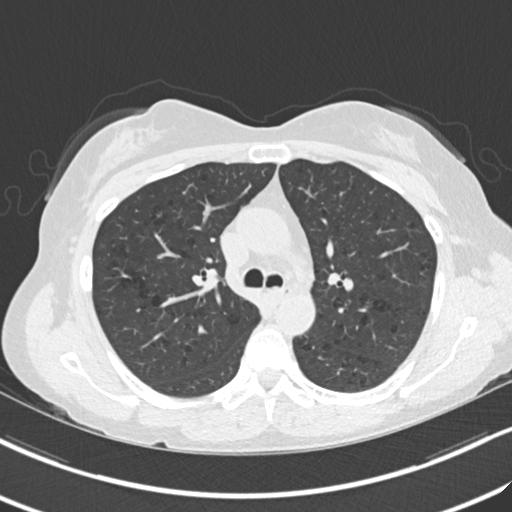
[im 119/155  lung]
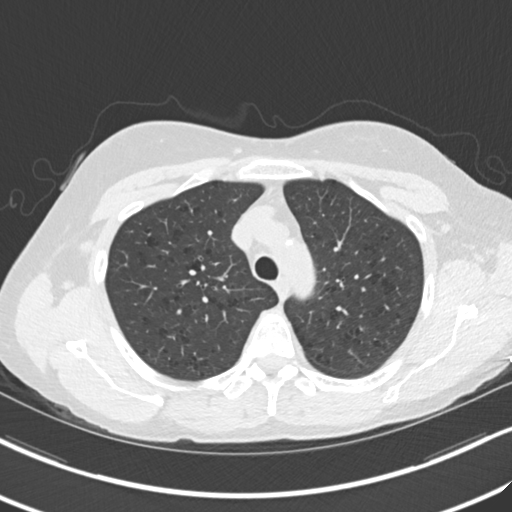
[im 131/155  lung]
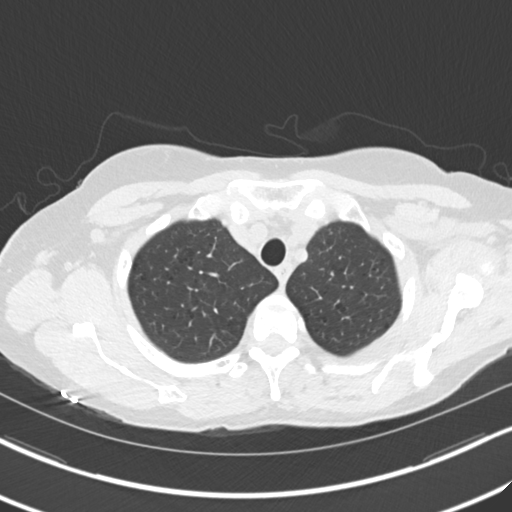
[im 143/155  lung]
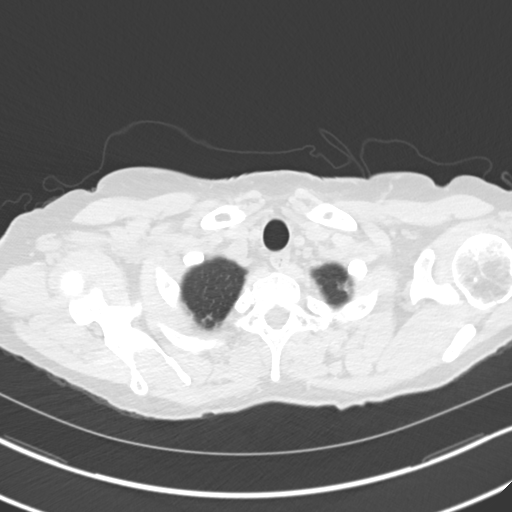

[Series 5: coronal · coronal · 0.63mm/px · 3 of 124 slices shown]
[im 25/124  lung]
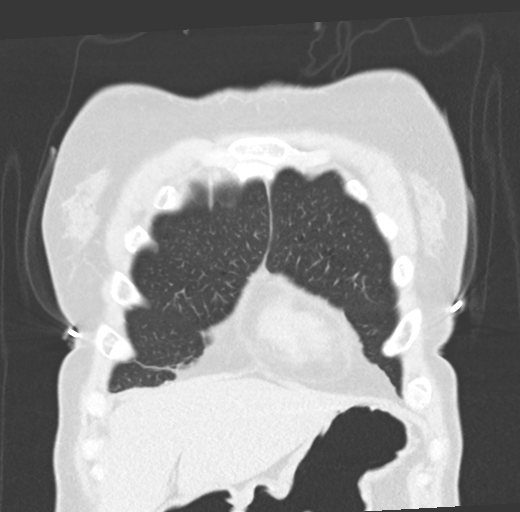
[im 50/124  lung]
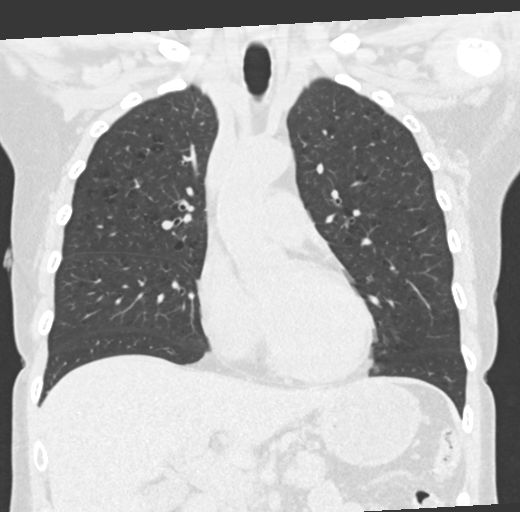
[im 74/124  lung]
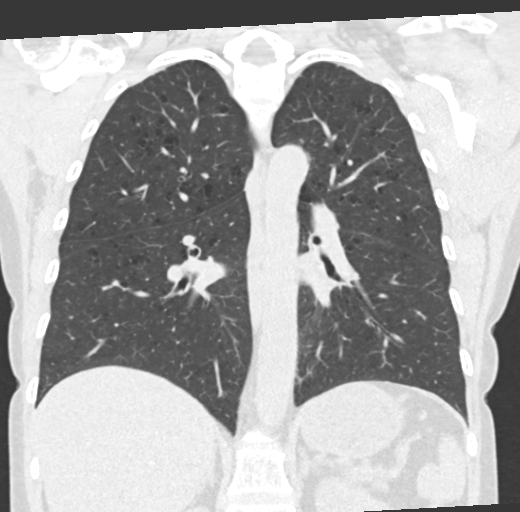

[15 of 36 positions shown; findings below may reference images not displayed]

FINDINGS: Cardiovascular: Aortic atherosclerosis without thoracic aortic
aneurysm. Unremarkable noncontrast appearance of the visualized
portions of the left jugular vein. Normal size heart. No significant
pericardial effusion/thickening.

Mediastinum/Nodes: Hypodense 7 mm nodule in the right lobe of the
thyroid. Not clinically significant; no follow-up imaging
recommended (ref: [HOSPITAL]. [DATE]): 143-50).No
pathologically enlarged mediastinal, hilar or axillary lymph nodes,
noting limited sensitivity for the detection of hilar adenopathy on
this noncontrast study. Esophagus is grossly unremarkable.

Lungs/Pleura: Mild biapical pleuroparenchymal scarring. Nodular
opacity in the peripheral paramedian left lower lobe measuring
cm on image 123/3. Scattered subsegmental atelectasis/scarring.
Centrilobular emphysematous change. Solid 4 mm left lower lobe
pulmonary nodule on image 107/3 and solid 4 mm right lower lobe
pulmonary nodule on image 47/3. No pleural effusion. No
pneumothorax.

Upper Abdomen: No acute abnormality.

Musculoskeletal: Multilevel degenerative changes spine. No acute
osseous abnormality.
IMPRESSION: 1. Unremarkable noncontrast appearance of the visualized portions of
the left jugular vein, please note contrasted CT of the neck would
be more utility in the evaluation of jugular venous thrombosis
2. Nodular opacity in the peripheral paramedian left lower lobe
measuring 2.2 cm, possibly infectious or inflammatory with a
pertinent alternate differential consideration of parenchymal
hemorrhage related to possible pulmonary embolus in this patient
with known venous thrombosis. Consider further evaluation with CTA
chest if clinically indicated.
3. Small bilateral lower lobe pulmonary nodules measuring up to 4
mm. No follow-up needed if patient is low-risk.This recommendation
follows the consensus statement: Guidelines for Management of
Incidental Pulmonary Nodules Detected on CT Images: From the
4. Aortic Atherosclerosis (DAG6R-IEI.I) and Emphysema (DAG6R-ZWA.M).

## 2022-08-02 ENCOUNTER — Inpatient Hospital Stay: Payer: 59

## 2022-08-02 ENCOUNTER — Inpatient Hospital Stay: Payer: 59 | Attending: Oncology | Admitting: Medical Oncology

## 2022-10-09 ENCOUNTER — Emergency Department: Payer: 59

## 2022-10-09 ENCOUNTER — Other Ambulatory Visit: Payer: Self-pay

## 2022-10-09 ENCOUNTER — Emergency Department
Admission: EM | Admit: 2022-10-09 | Discharge: 2022-10-09 | Disposition: A | Discharge status: Home / Self Care | Payer: 59 | Attending: Emergency Medicine | Admitting: Emergency Medicine

## 2022-10-09 DIAGNOSIS — S060X1A Concussion with loss of consciousness of 30 minutes or less, initial encounter: Secondary | ICD-10-CM | POA: Diagnosis not present

## 2022-10-09 DIAGNOSIS — R4182 Altered mental status, unspecified: Secondary | ICD-10-CM | POA: Diagnosis not present

## 2022-10-09 DIAGNOSIS — W19XXXA Unspecified fall, initial encounter: Secondary | ICD-10-CM | POA: Insufficient documentation

## 2022-10-09 DIAGNOSIS — R55 Syncope and collapse: Secondary | ICD-10-CM

## 2022-10-09 DIAGNOSIS — R519 Headache, unspecified: Secondary | ICD-10-CM | POA: Diagnosis not present

## 2022-10-09 LAB — ETHANOL: Alcohol, Ethyl (B): <10 mg/dL (ref <10)

## 2022-10-09 LAB — HEPATIC FUNCTION PANEL
ALT: 55 U/L — ABNORMAL HIGH (ref 0–44)
AST: 56 U/L — ABNORMAL HIGH (ref 15–41)
Albumin: 4.1 g/dL (ref 3.5–5.0)
Alkaline Phosphatase: 64 U/L (ref 38–126)
Bilirubin, Direct: <0.1 mg/dL (ref 0.0–0.2)
Total Bilirubin: 0.6 mg/dL (ref 0.3–1.2)
Total Protein: 7.1 g/dL (ref 6.5–8.1)

## 2022-10-09 LAB — CBC
HCT: 39.4 % (ref 36.0–46.0)
Hemoglobin: 13 g/dL (ref 12.0–15.0)
MCH: 34.3 pg — ABNORMAL HIGH (ref 26.0–34.0)
MCHC: 33 g/dL (ref 30.0–36.0)
MCV: 104 fL — ABNORMAL HIGH (ref 80.0–100.0)
Platelets: 184 10*3/uL (ref 150–400)
RBC: 3.79 MIL/uL — ABNORMAL LOW (ref 3.87–5.11)
RDW: 13.2 % (ref 11.5–15.5)
WBC: 3.3 10*3/uL — ABNORMAL LOW (ref 4.0–10.5)
nRBC: 0 % (ref 0.0–0.2)

## 2022-10-09 LAB — BASIC METABOLIC PANEL
Anion gap: 10 (ref 5–15)
BUN: 6 mg/dL (ref 6–20)
CO2: 28 mmol/L (ref 22–32)
Calcium: 9.2 mg/dL (ref 8.9–10.3)
Chloride: 99 mmol/L (ref 98–111)
Creatinine, Ser: 0.55 mg/dL (ref 0.44–1.00)
GFR, Estimated: >60 mL/min (ref >60)
Glucose, Bld: 127 mg/dL — ABNORMAL HIGH (ref 70–99)
Potassium: 4.3 mmol/L (ref 3.5–5.1)
Sodium: 137 mmol/L (ref 135–145)

## 2022-10-09 LAB — MAGNESIUM: Magnesium: 2 mg/dL (ref 1.7–2.4)

## 2022-10-09 LAB — LIPASE, BLOOD: Lipase: 31 U/L (ref 11–51)

## 2022-10-09 MED ORDER — ONDANSETRON 4 MG PO TBDP: 4.0000 mg | ORAL_TABLET | Freq: Three times a day (TID) | ORAL | 0 refills | Status: DC | PRN

## 2022-10-09 MED ORDER — DEXTROSE IN LACTATED RINGERS 5 % IV SOLN
1000.0000 mL | Freq: Once | INTRAVENOUS | Status: AC
  Administered 2022-10-09: 1000 mL via INTRAVENOUS
  Filled 2022-10-09: qty 1000

## 2022-10-09 MED ORDER — ONDANSETRON HCL 4 MG/2ML IJ SOLN
4.0000 mg | Freq: Once | INTRAMUSCULAR | Status: AC
  Administered 2022-10-09: 4 mg via INTRAVENOUS
  Filled 2022-10-09: qty 2

## 2022-10-09 MED ORDER — KETOROLAC TROMETHAMINE 15 MG/ML IJ SOLN
15.0000 mg | Freq: Once | INTRAMUSCULAR | Status: AC
  Administered 2022-10-09: 15 mg via INTRAVENOUS
  Filled 2022-10-09: qty 1

## 2022-10-09 NOTE — ED Triage Notes (Signed)
Pt to ED [facility] from home for unwitnessed fall and LOC today. Family reports hurd a loud thud and found pt on the ground unconscious. Normally oriented x4. Pt disoriented to time, cannot answer basic orientation questions correctly.  Family reports heavy ETOH use but pt has been trying to cut back and has not had any today. Knot noted to left forehead 18g to [facility]

## 2022-10-09 NOTE — ED Provider Notes (Signed)
[facility] Provider Note    Event Date/Time   First MD Initiated Contact with Patient 10/09/22 1652     (approximate)   History   Chief Complaint: Loss of Consciousness   HPI  Nicole Macdonald is a 52 y.o. female with a history of alcohol abuse, bipolar disorder who was brought to the ED due to an unwitnessed fall today.  Patient reports being in her usual state of health but then was heard falling and hitting the floor at home.  Patient seemed disoriented at the time.  She has had vomiting prior to arrival in the hospital and on arrival.  Complains of generalized headache.     Physical Exam   Triage Vital Signs: ED Triage Vitals [10/09/22 1641]  Encounter Vitals Group     BP (!) 147/95     Systolic BP Percentile      Diastolic BP Percentile      Pulse Rate 96     Resp 20     Temp 98 F (36.7 C)     Temp src      SpO2 97 %     Weight 140 lb (63.5 kg)     Height 5\' 3"  (1.6 m)     Head Circumference      Peak Flow      Pain Score 5     Pain Loc      Pain Education      Exclude from Growth Chart     Most recent vital signs: Vitals:   10/09/22 1830 10/09/22 1917  BP: (!) 148/95   Pulse: 72   Resp: 15   Temp:    SpO2: 98% 97%    General: Awake, no distress.  CV:  Good peripheral perfusion.  Regular rate and rhythm Resp:  Normal effort.  Clear to auscultation bilaterally Abd:  No distention.  Soft nontender Other:  Cranial nerves II through XII intact.  No midline spinal tenderness.   ED Results / Procedures / Treatments   Labs (all labs ordered are listed, but only abnormal results are displayed) Labs Reviewed  CBC - Abnormal; Notable for the following components:      Result Value   WBC 3.3 (*)    RBC 3.79 (*)    MCV 104.0 (*)    MCH 34.3 (*)    All other components within normal limits  BASIC METABOLIC PANEL - Abnormal; Notable for the following components:   Glucose, Bld 127 (*)    All other components within normal  limits  HEPATIC FUNCTION PANEL - Abnormal; Notable for the following components:   AST 56 (*)    ALT 55 (*)    All other components within normal limits  ETHANOL  LIPASE, BLOOD  MAGNESIUM     EKG Interpreted by me Sinus tachycardia rate 108.  Normal axis and intervals.  Poor R wave progression.  Normal ST segments and T waves   RADIOLOGY CT head interpreted by me, negative for intracranial hemorrhage.  Radiology report reviewed.  CT cervical spine unremarkable   PROCEDURES:  Procedures   MEDICATIONS ORDERED IN ED: Medications  dextrose 5 % in lactated ringers infusion (0 mLs Intravenous Stopped 10/09/22 1916)  ketorolac (TORADOL) 15 MG/ML injection 15 mg (15 mg Intravenous Given 10/09/22 1810)  ondansetron (ZOFRAN) injection 4 mg (4 mg Intravenous Given 10/09/22 1913)     IMPRESSION / MDM / ASSESSMENT AND PLAN / ED COURSE  I reviewed the triage vital signs and  the nursing notes.  DDx: Intracranial hemorrhage, C-spine fracture, dehydration, metabolic acidosis, AKI, electrolyte abnormality, anemia  Patient's presentation is most consistent with acute presentation with potential threat to life or bodily function.  Patient presents with fall at home.  Vital signs are normal and she reports being under treatment for alcohol detox, abdominal Withdrawal syndrome/seizure.  No evidence of infection.  Will give IV fluids, Toradol for pain and reassess.  ----------------------------------------- 7:17 PM on 10/09/2022 ----------------------------------------- CT imaging unremarkable.  After fluids, patient is still having vomiting, Zofran given.  Will reassess.   ----------------------------------------- 9:37 PM on 10/09/2022 ----------------------------------------- Patient feeling better, nausea vomiting and headache have all resolved.  Patient feels ready to go home.  Denies any alcohol withdrawal symptoms currently and reiterates that she has medicine for home pharmacologic  detox provided by her doctor.  Will p.o. trial      FINAL CLINICAL IMPRESSION(S) / ED DIAGNOSES   Final diagnoses:  Syncope, unspecified syncope type  Concussion with loss of consciousness of 30 minutes or less, initial encounter     Rx / DC Orders   ED Discharge Orders          Ordered    ondansetron (ZOFRAN-ODT) 4 MG disintegrating tablet  Every 8 hours PRN        10/09/22 2137             Note:  This document was prepared using Dragon voice recognition software and may include unintentional dictation errors.   Sharman Cheek, MD 10/09/22 2137

## 2022-10-09 NOTE — ED Notes (Signed)
Assisted pt to toilet, gait was unsteady. Placed collection device in toilet to obtain urine specimen if needed however, pt urinated outside of collection device. Assisted pt back to locked stretcher in lowest position with call bell in reach. Instructed pt to call for assistance if she needed to get out of bed, pt gave verbal understanding of instructions.

## 2022-10-09 NOTE — ED Notes (Signed)
Pt family member stating pt was in bathroom when fell, found next to commode, husband thinks she hit her head and left should on the toilet.

## 2022-10-29 ENCOUNTER — Telehealth: Payer: Self-pay

## 2022-10-29 NOTE — Telephone Encounter (Signed)
Transition Care Management Unsuccessful Follow-up Telephone Call  Date of discharge and from where:  [facility] 9/4  Attempts:  1st Attempt  Reason for unsuccessful TCM follow-up call:  No answer/busy   Nicole Macdonald [place]  [place], [facility] Guide, Phone: [phone] Website: [email or web address]

## 2022-10-31 ENCOUNTER — Telehealth: Payer: Self-pay

## 2022-10-31 NOTE — Telephone Encounter (Signed)
Transition Care Management Unsuccessful Follow-up Telephone Call  Date of discharge and from where:  [facility] 9/4  Attempts:  2nd Attempt  Reason for unsuccessful TCM follow-up call:  No answer/busy   Lenard Forth [place]  [facility], [facility] Guide, Phone: [phone] Website: [email or web address]

## 2022-11-20 ENCOUNTER — Other Ambulatory Visit: Payer: Self-pay

## 2022-11-20 ENCOUNTER — Encounter: Payer: Self-pay | Admitting: Oncology

## 2022-11-20 ENCOUNTER — Inpatient Hospital Stay: Payer: 59 | Attending: Oncology

## 2022-11-20 ENCOUNTER — Inpatient Hospital Stay (HOSPITAL_BASED_OUTPATIENT_CLINIC_OR_DEPARTMENT_OTHER): Payer: 59 | Admitting: Oncology

## 2022-11-20 VITALS — BP 119/90 | HR 94 | Temp 97.9°F | Resp 18 | Ht 63.0 in | Wt 139.8 lb

## 2022-11-20 DIAGNOSIS — G08 Intracranial and intraspinal phlebitis and thrombophlebitis: Secondary | ICD-10-CM

## 2022-11-20 DIAGNOSIS — F17210 Nicotine dependence, cigarettes, uncomplicated: Secondary | ICD-10-CM | POA: Diagnosis not present

## 2022-11-20 DIAGNOSIS — D539 Nutritional anemia, unspecified: Secondary | ICD-10-CM

## 2022-11-20 DIAGNOSIS — Z86718 Personal history of other venous thrombosis and embolism: Secondary | ICD-10-CM | POA: Insufficient documentation

## 2022-11-20 LAB — CBC WITH DIFFERENTIAL/PLATELET
Abs Immature Granulocytes: 0.01 10*3/uL (ref 0.00–0.07)
Basophils Absolute: 0.1 10*3/uL (ref 0.0–0.1)
Basophils Relative: 1 %
Eosinophils Absolute: 0.2 10*3/uL (ref 0.0–0.5)
Eosinophils Relative: 4 %
HCT: 41 % (ref 36.0–46.0)
Hemoglobin: 13.6 g/dL (ref 12.0–15.0)
Immature Granulocytes: 0 %
Lymphocytes Relative: 38 %
Lymphs Abs: 1.7 10*3/uL (ref 0.7–4.0)
MCH: 34.3 pg — ABNORMAL HIGH (ref 26.0–34.0)
MCHC: 33.2 g/dL (ref 30.0–36.0)
MCV: 103.5 fL — ABNORMAL HIGH (ref 80.0–100.0)
Monocytes Absolute: 0.4 10*3/uL (ref 0.1–1.0)
Monocytes Relative: 10 %
Neutro Abs: 2.1 10*3/uL (ref 1.7–7.7)
Neutrophils Relative %: 47 %
Platelets: 324 10*3/uL (ref 150–400)
RBC: 3.96 MIL/uL (ref 3.87–5.11)
RDW: 12.3 % (ref 11.5–15.5)
WBC: 4.4 10*3/uL (ref 4.0–10.5)
nRBC: 0 % (ref 0.0–0.2)

## 2022-11-20 LAB — IRON AND TIBC
Iron: 97 ug/dL (ref 28–170)
Saturation Ratios: 23 % (ref 10.4–31.8)
TIBC: 428 ug/dL (ref 250–450)
UIBC: 331 ug/dL

## 2022-11-20 LAB — VITAMIN B12: Vitamin B-12: 408 pg/mL (ref 180–914)

## 2022-11-20 LAB — FERRITIN: Ferritin: 58 ng/mL (ref 11–307)

## 2022-11-20 NOTE — Progress Notes (Signed)
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  She had a CT and MRI brain as well as CT venogram head which showed left sigmoid sinus and internal jugular vein thrombosis.  As per her discharge summary there was no concern for any oropharyngeal infection that would be suggestive of septic thrombophlebitis.  Patient was discharged on Eliquis and asked to follow-up with hematology and neurology.  She had a hypercoagulable work-up done which was essentially unremarkable including antiphospholipid antibody syndrome panel.  Homocystine levels are mildly elevated at 16.8.       Hypercoagulable workup negative.  JAK2 mutation negative.  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  Patient is currently on Eliquis.  Interval history-patient has not had any repeated episodes of internal jugular vein thrombosis or thrombosis in other areas after she stopped taking anticoagulation about 9 months ago.  She was in the hospital in September 2024 for possible syncopal episode.  Currently she isOn medications for alcohol abstinence and is working well for her.  ECOG PS- 1 Pain scale- 0  Review of systems- Review of Systems  Constitutional:  Positive for malaise/fatigue. Negative  for chills, fever and weight loss.  HENT:  Negative for congestion, ear discharge and nosebleeds.   Eyes:  Negative for blurred vision.  Respiratory:  Negative for cough, hemoptysis, sputum production, shortness of breath and wheezing.   Cardiovascular:  Negative for chest pain, palpitations, orthopnea and claudication.  Gastrointestinal:  Negative for abdominal pain, blood in stool, constipation, diarrhea, heartburn, melena, nausea and vomiting.  Genitourinary:  Negative for dysuria, flank pain, frequency, hematuria and urgency.  Musculoskeletal:  Negative for back pain, joint pain and myalgias.  Skin:  Negative for rash.  Neurological:  Negative for dizziness, tingling, focal weakness, seizures, weakness and headaches.  Endo/Heme/Allergies:  Does not bruise/bleed easily.  Psychiatric/Behavioral:  Negative for depression and suicidal ideas. The patient does not have insomnia.       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   TIA (transient ischemic attack)      Past Surgical History:  Procedure Laterality Date   COMPRESSION HIP SCREW Left 04/19/2017   Procedure: left hip cannulated screw pinning;  Surgeon: Christena Flake, MD;  Location: [facility] ORS;  Service: Orthopedics;  Laterality: Left;   denies      Social History   Socioeconomic History   Marital status: Single    Spouse name: Not on file   Number of children: Not on file   Years of education: Not on file  Highest education level: Not on file  Occupational History   Not on file  Tobacco Use   Smoking status: Every Day    Current packs/day: 0.50    Types: Cigarettes   Smokeless tobacco: Never  Vaping Use   Vaping status: Every Day  Substance  and Sexual Activity   Alcohol use: Yes   Drug use: Not Currently    Types: Cocaine, Marijuana    Comment: 15 days ago last time cocaine, marijuana complted stop   Sexual activity: Not Currently  Other Topics Concern   Not on file  Social History Narrative   ** Merged History Encounter **       Social Determinants of Health   Financial Resource Strain: Not on file  Food Insecurity: Not on file  Transportation Needs: Not on file  Physical Activity: Not on file  Stress: Not on file  Social Connections: Not on file  Intimate Partner Violence: Not on file    Family History  Problem Relation Age of Onset   Ovarian cancer Mother    Diabetes Maternal Grandmother    Lung cancer Maternal Grandmother    Transient ischemic attack Neg Hx    Stroke Neg Hx      Current Outpatient Medications:    albuterol (VENTOLIN HFA) 108 (90 Base) MCG/ACT inhaler, Inhale 2 puffs into the lungs every 6 (six) hours as needed for wheezing or shortness of breath., Disp: 8 g, Rfl: 0   atomoxetine (STRATTERA) 60 MG capsule, Take 60 mg by mouth at bedtime., Disp: , Rfl:    BIOTIN PO, Take 1 tablet by mouth daily., Disp: , Rfl:    clonazePAM (KLONOPIN) 1 MG tablet, Take 1 mg by mouth 3 (three) times daily., Disp: , Rfl:    clonazePAM (KLONOPIN) 2 MG tablet, Take 2 mg by mouth 2 (two) times daily. (Patient not taking: Reported on 01/30/2022), Disp: , Rfl:    FLUoxetine (PROZAC) 40 MG capsule, Take 40 mg by mouth daily., Disp: , Rfl:    fluticasone (FLONASE) 50 MCG/ACT nasal spray, Place 2 sprays into both nostrils daily., Disp: , Rfl:    gabapentin (NEURONTIN) 600 MG tablet, Take 600 mg by mouth 4 (four) times daily., Disp: , Rfl:    naltrexone (DEPADE) 50 MG tablet, Take by mouth. (Patient not taking: Reported on 05/21/2022), Disp: , Rfl:    nicotine (NICODERM CQ - DOSED IN MG/24 HOURS) 21 mg/24hr patch, Place 1 patch (21 mg total) onto the skin daily. (Patient not taking: Reported on 01/30/2022), Disp: 28  patch, Rfl: 0   ondansetron (ZOFRAN-ODT) 4 MG disintegrating tablet, Take 1 tablet (4 mg total) by mouth every 8 (eight) hours as needed for nausea or vomiting., Disp: 20 tablet, Rfl: 0   propranolol (INDERAL) 10 MG tablet, Take 10 mg by mouth., Disp: , Rfl:    vitamin B-12 (CYANOCOBALAMIN) 1000 MCG tablet, Take 1,000 mcg by mouth daily., Disp: , Rfl:   Physical exam:  Vitals:   11/20/22 1349  BP: (!) 119/90  Pulse: 94  Resp: 18  Temp: 97.9 F (36.6 C)  TempSrc: Tympanic  SpO2: 97%  Weight: 139 lb 12.8 oz (63.4 kg)  Height: 5\' 3"  (1.6 m)   Physical Exam Cardiovascular:     Rate and Rhythm: Normal rate and regular rhythm.     Heart sounds: Normal heart sounds.  Pulmonary:     Effort: Pulmonary effort is normal.     Breath sounds: Normal breath sounds.  Skin:    General:  Skin is warm and dry.  Neurological:     Mental Status: She is alert and oriented to person, place, and time.         Latest Ref Rng & Units 10/09/2022    4:42 PM  CMP  Glucose 70 - 99 mg/dL 664   BUN 6 - 20 mg/dL 6   Creatinine 4.03 - 4.74 mg/dL 2.59   Sodium 563 - 875 mmol/L 137   Potassium 3.5 - 5.1 mmol/L 4.3   Chloride 98 - 111 mmol/L 99   CO2 22 - 32 mmol/L 28   Calcium 8.9 - 10.3 mg/dL 9.2   Total Protein 6.5 - 8.1 g/dL 7.1   Total Bilirubin 0.3 - 1.2 mg/dL 0.6   Alkaline Phos 38 - 126 U/L 64   AST 15 - 41 U/L 56   ALT 0 - 44 U/L 55       Latest Ref Rng & Units 11/20/2022    1:06 PM  CBC  WBC 4.0 - 10.5 K/uL 4.4   Hemoglobin 12.0 - 15.0 g/dL 64.3   Hematocrit 32.9 - 46.0 % 41.0   Platelets 150 - 400 K/uL 324     No images are attached to the encounter.  No results found.   Assessment and plan- Patient is a 52 y.o. female who is here for follow-up of following issues  History of internal jugular vein/sigmoid sinus thrombosis: This was back in April 2023 and patient was on anticoagulation for 8 months and stopped it in December 2023.  She is doing well off anticoagulation with no  recurrent episodes of thrombosis.  Continue to monitor  Macrocytosis without anemia: Patient's hemoglobin has remained stable around 13.  Additional anemia labs from today are pending.  Continue to monitor  Labs and I will see her back in 1 year   Visit Diagnosis 1. Macrocytic anemia   2. Dural venous sinus thrombosis      Dr. Owens Shark, MD, MPH [facility] [phone] 11/20/2022 4:21 PM

## 2022-11-21 LAB — KAPPA/LAMBDA LIGHT CHAINS
Kappa free light chain: 22 mg/L — ABNORMAL HIGH (ref 3.3–19.4)
Kappa, lambda light chain ratio: 1.22 (ref 0.26–1.65)
Lambda free light chains: 18.1 mg/L (ref 5.7–26.3)

## 2022-11-22 LAB — MULTIPLE MYELOMA PANEL, SERUM
Albumin SerPl Elph-Mcnc: 4.1 g/dL (ref 2.9–4.4)
Albumin/Glob SerPl: 1.4 (ref 0.7–1.7)
Alpha 1: 0.2 g/dL (ref 0.0–0.4)
Alpha2 Glob SerPl Elph-Mcnc: 0.8 g/dL (ref 0.4–1.0)
B-Globulin SerPl Elph-Mcnc: 1.1 g/dL (ref 0.7–1.3)
Gamma Glob SerPl Elph-Mcnc: 0.9 g/dL (ref 0.4–1.8)
Globulin, Total: 3 g/dL (ref 2.2–3.9)
IgA: 253 mg/dL (ref 87–352)
IgG (Immunoglobin G), Serum: 846 mg/dL (ref 586–1602)
IgM (Immunoglobulin M), Srm: 80 mg/dL (ref 26–217)
Total Protein ELP: 7.1 g/dL (ref 6.0–8.5)

## 2023-01-15 ENCOUNTER — Encounter: Payer: Self-pay | Admitting: *Deleted

## 2023-01-23 ENCOUNTER — Ambulatory Visit: Payer: 59 | Admitting: Cardiovascular Disease

## 2023-03-12 ENCOUNTER — Emergency Department
Admission: EM | Admit: 2023-03-12 | Discharge: 2023-03-12 | Disposition: A | Discharge status: Home / Self Care | Payer: 59 | Attending: Emergency Medicine | Admitting: Emergency Medicine

## 2023-03-12 ENCOUNTER — Emergency Department: Payer: 59

## 2023-03-12 ENCOUNTER — Other Ambulatory Visit: Payer: Self-pay

## 2023-03-12 DIAGNOSIS — R1010 Upper abdominal pain, unspecified: Secondary | ICD-10-CM

## 2023-03-12 DIAGNOSIS — A084 Viral intestinal infection, unspecified: Secondary | ICD-10-CM | POA: Insufficient documentation

## 2023-03-12 LAB — COMPREHENSIVE METABOLIC PANEL
ALT: 91 U/L — ABNORMAL HIGH (ref 0–44)
AST: 105 U/L — ABNORMAL HIGH (ref 15–41)
Albumin: 4.4 g/dL (ref 3.5–5.0)
Alkaline Phosphatase: 62 U/L (ref 38–126)
Anion gap: 14 (ref 5–15)
BUN: 8 mg/dL (ref 6–20)
CO2: 21 mmol/L — ABNORMAL LOW (ref 22–32)
Calcium: 8.8 mg/dL — ABNORMAL LOW (ref 8.9–10.3)
Chloride: 101 mmol/L (ref 98–111)
Creatinine, Ser: 0.58 mg/dL (ref 0.44–1.00)
GFR, Estimated: >60 mL/min (ref >60)
Glucose, Bld: 102 mg/dL — ABNORMAL HIGH (ref 70–99)
Potassium: 3.4 mmol/L — ABNORMAL LOW (ref 3.5–5.1)
Sodium: 136 mmol/L (ref 135–145)
Total Bilirubin: 0.6 mg/dL (ref 0.0–1.2)
Total Protein: 7.4 g/dL (ref 6.5–8.1)

## 2023-03-12 LAB — CBC
HCT: 37.5 % (ref 36.0–46.0)
Hemoglobin: 12.7 g/dL (ref 12.0–15.0)
MCH: 34.7 pg — ABNORMAL HIGH (ref 26.0–34.0)
MCHC: 33.9 g/dL (ref 30.0–36.0)
MCV: 102.5 fL — ABNORMAL HIGH (ref 80.0–100.0)
Platelets: 211 10*3/uL (ref 150–400)
RBC: 3.66 MIL/uL — ABNORMAL LOW (ref 3.87–5.11)
RDW: 12.4 % (ref 11.5–15.5)
WBC: 5 10*3/uL (ref 4.0–10.5)
nRBC: 0 % (ref 0.0–0.2)

## 2023-03-12 LAB — LIPASE, BLOOD: Lipase: 32 U/L (ref 11–51)

## 2023-03-12 MED ORDER — ONDANSETRON HCL 4 MG/2ML IJ SOLN
4.0000 mg | Freq: Once | INTRAMUSCULAR | Status: AC
  Administered 2023-03-12: 4 mg via INTRAVENOUS
  Filled 2023-03-12: qty 2

## 2023-03-12 MED ORDER — LOPERAMIDE HCL 2 MG PO TABS: 4.0000 mg | ORAL_TABLET | Freq: Four times a day (QID) | ORAL | 0 refills | Status: AC | PRN

## 2023-03-12 MED ORDER — LORAZEPAM 1 MG PO TABS
1.0000 mg | ORAL_TABLET | Freq: Once | ORAL | Status: AC
  Administered 2023-03-12: 1 mg via ORAL
  Filled 2023-03-12: qty 1

## 2023-03-12 MED ORDER — ONDANSETRON 4 MG PO TBDP: 4.0000 mg | ORAL_TABLET | Freq: Three times a day (TID) | ORAL | 0 refills | Status: AC | PRN

## 2023-03-12 MED ORDER — IOHEXOL 300 MG/ML  SOLN
100.0000 mL | Freq: Once | INTRAMUSCULAR | Status: AC | PRN
  Administered 2023-03-12: 100 mL via INTRAVENOUS

## 2023-03-12 MED ORDER — SODIUM CHLORIDE 0.9 % IV BOLUS
1000.0000 mL | Freq: Once | INTRAVENOUS | Status: AC
  Administered 2023-03-12: 1000 mL via INTRAVENOUS

## 2023-03-12 MED ORDER — KETOROLAC TROMETHAMINE 15 MG/ML IJ SOLN
15.0000 mg | Freq: Once | INTRAMUSCULAR | Status: AC
  Administered 2023-03-12: 15 mg via INTRAVENOUS
  Filled 2023-03-12: qty 1

## 2023-03-12 NOTE — ED Provider Notes (Addendum)
 [facility] Provider Note    Event Date/Time   First MD Initiated Contact with Patient 03/12/23 2129     (approximate)   History   Chief Complaint: Abdominal Pain   HPI  Nicole Macdonald is a 53 y.o. female with a history of bipolar disorder and alcohol abuse who comes to the ED complaining of upper abdominal pain for the past 2 days associated with vomiting and diarrhea.  No hematemesis, no melanotic stool.  Reports that she has had decreased oral intake due to the symptoms, and today decreased urine output.  No aggravating or alleviating factors.  Feels like abdomen is bloated.  Denies fever.  No chest pain or shortness of breath.          Physical Exam   Triage Vital Signs: ED Triage Vitals  Encounter Vitals Group     BP 03/12/23 1840 (!) 141/86     Systolic BP Percentile --      Diastolic BP Percentile --      Pulse Rate 03/12/23 1840 (!) 103     Resp 03/12/23 1840 (!) 22     Temp 03/12/23 1840 97.9 F (36.6 C)     Temp Source 03/12/23 1840 Oral     SpO2 03/12/23 1840 98 %     Weight 03/12/23 1830 139 lb 12.4 oz (63.4 kg)     Height --      Head Circumference --      Peak Flow --      Pain Score 03/12/23 1829 8     Pain Loc --      Pain Education --      Exclude from Growth Chart --     Most recent vital signs: Vitals:   03/12/23 1840 03/12/23 2133  BP: (!) 141/86 123/74  Pulse: (!) 103 87  Resp: (!) 22 20  Temp: 97.9 F (36.6 C)   SpO2: 98% 96%    General: Awake, no distress.  CV:  Good peripheral perfusion.  Regular rate rhythm Resp:  Normal effort.  Clear to auscultation bilaterally Abd:  No distention.  Soft with mild left-sided tenderness.  No peritoneal signs Other:  Somewhat dry oral mucosa.  No rash.   ED Results / Procedures / Treatments   Labs (all labs ordered are listed, but only abnormal results are displayed) Labs Reviewed  COMPREHENSIVE METABOLIC PANEL - Abnormal; Notable for the following  components:      Result Value   Potassium 3.4 (*)    CO2 21 (*)    Glucose, Bld 102 (*)    Calcium  8.8 (*)    AST 105 (*)    ALT 91 (*)    All other components within normal limits  CBC - Abnormal; Notable for the following components:   RBC 3.66 (*)    MCV 102.5 (*)    MCH 34.7 (*)    All other components within normal limits  LIPASE, BLOOD  URINALYSIS, ROUTINE W REFLEX MICROSCOPIC     EKG Interpreted by me Sinus tachycardia rate 107.  Normal axis and intervals.  Poor R wave progression.  No acute ischemic changes.  artifact limits interpretation.   RADIOLOGY CT abdomen pelvis pending   PROCEDURES:  Procedures   MEDICATIONS ORDERED IN ED: Medications  LORazepam  (ATIVAN ) tablet 1 mg (1 mg Oral Given 03/12/23 1840)  sodium chloride  0.9 % bolus 1,000 mL (1,000 mLs Intravenous Bolus 03/12/23 2215)  ondansetron  (ZOFRAN ) injection 4 mg (4 mg Intravenous Given  03/12/23 2216)  ketorolac  (TORADOL ) 15 MG/ML injection 15 mg (15 mg Intravenous Given 03/12/23 2216)  iohexol  (OMNIPAQUE ) 300 MG/ML solution 100 mL (100 mLs Intravenous Contrast Given 03/12/23 2244)     IMPRESSION / MDM / ASSESSMENT AND PLAN / ED COURSE  I reviewed the triage vital signs and the nursing notes.  DDx: Diverticulitis, colitis, viral gastroenteritis, influenza, UTI, anemia, electrolyte derangement, AKI, pancreatitis  Patient's presentation is most consistent with acute presentation with potential threat to life or bodily function.  Patient presents with upper abdominal pain, on exam has tenderness on the left side.  Doubt cholecystitis or appendicitis.  Low suspicion for perforation.  Labs are reassuring, vital signs unremarkable.  Will give IV fluids for hydration, obtain CT abdomen pelvis.    ----------------------------------------- 11:09 PM on 03/12/2023 ----------------------------------------- CT negative.  Feeling better, abdomen nontender.  Presentation consistent with a viral gastroenteritis,  stable for discharge     FINAL CLINICAL IMPRESSION(S) / ED DIAGNOSES   Final diagnoses:  Pain of upper abdomen  Viral gastroenteritis     Rx / DC Orders   ED Discharge Orders          Ordered    ondansetron  (ZOFRAN -ODT) 4 MG disintegrating tablet  Every 8 hours PRN        03/12/23 2308    loperamide  (IMODIUM  A-D) 2 MG tablet  4 times daily PRN        03/12/23 2308             Note:  This document was prepared using Dragon voice recognition software and may include unintentional dictation errors.   Viviann Pastor, MD 03/12/23 2243    Viviann Pastor, MD 03/12/23 [phone]

## 2023-03-12 NOTE — ED Notes (Signed)
 Pt contact made and myself introduced. Pt is CAOx4, breathing normally, and normal in color. Pt is in bed, appears to be in slight distress with abdominal pain, and has family at bedside.

## 2023-03-12 NOTE — ED Provider Triage Note (Signed)
 Emergency Medicine Provider Triage Evaluation Note  Nicole Macdonald , a 53 y.o. female  was evaluated in triage.  Pt complains of 2 days of nauseas, vomiting diarrhea.  Review of Systems  Positive:  Negative:   Physical Exam  Wt 63.4 kg   BMI 24.76 kg/m  Gen:   Awake, no distress   Resp:  Normal effort  MSK:   Moves extremities without difficulty  Other:  Abdomen distended, rebound positive  Medical Decision Making  Medically screening exam initiated at 6:31 PM.  Appropriate orders placed.  Nicole Macdonald was informed that the remainder of the evaluation will be completed by another provider, this initial triage assessment does not replace that evaluation, and the importance of remaining in the ED until their evaluation is complete.  Patient with abdominal pain ordered CBC CMP CT scan, EKG   Nicole Kast, PA-C 03/12/23 1832

## 2023-03-12 NOTE — ED Triage Notes (Addendum)
 C/O upper abdominal pain. C/O vomiting and diarrhea x 2 days.  Denies fever.  States unable to pee today. Only able to void x 1 today.  Constant fidgeting in triage.  Unable to sit still. Movement jerky, purposeful.

## 2023-04-01 ENCOUNTER — Ambulatory Visit: Payer: 59

## 2023-04-01 ENCOUNTER — Ambulatory Visit: Payer: 59 | Attending: Cardiology | Admitting: Cardiology

## 2023-04-01 ENCOUNTER — Encounter: Payer: Self-pay | Admitting: Cardiology

## 2023-04-01 VITALS — BP 122/78 | HR 97 | Ht 63.0 in | Wt 144.2 lb

## 2023-04-01 DIAGNOSIS — R55 Syncope and collapse: Secondary | ICD-10-CM

## 2023-04-01 DIAGNOSIS — F17200 Nicotine dependence, unspecified, uncomplicated: Secondary | ICD-10-CM | POA: Diagnosis not present

## 2023-04-01 DIAGNOSIS — R0602 Shortness of breath: Secondary | ICD-10-CM

## 2023-04-01 NOTE — Progress Notes (Signed)
 Cardiology Office Note:    Date:  04/01/2023   ID:  Nicole Macdonald, DOB 12-21-1970, MRN [id number]  PCP:  Center, [facility]   [facility] Providers Cardiologist:  Debbe Odea, MD     Referring MD: Center, [facility]*   No chief complaint on file.   History of Present Illness:    Nicole Macdonald is a 53 y.o. 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  Admitted 10/09/2022 to [facility] ED after a fall hitting her head at home.  Was walking in her home and suddenly felt dizzy passing out.  Has not had any further episodes of passing out but states having dizziness sometimes with standing up too quickly or bending over.  Denies palpitations or chest pain.  Endorses shortness of breath.  She is a current smoker.  Has a history of left-sided weakness back in 05/2021, workup with head CT showed acute left sigmoid sinus and left internal jugular vein thrombosis.  Coagulation workup was unrevealing.  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  Patient was managed with Eliquis.  Echo 05/2021 EF 55 to 60%, normal diastolic function.  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   clonazePAM (KLONOPIN) 1 MG tablet Take 1 mg by mouth 3 (three) times daily.   FLUoxetine (PROZAC) 40 MG capsule Take 40 mg by mouth daily.   fluticasone (FLONASE) 50 MCG/ACT nasal spray Place 2 sprays into both nostrils daily.   gabapentin (NEURONTIN) 600 MG tablet Take 600 mg by mouth 4 (four) times daily.   loperamide (IMODIUM A-D) 2 MG tablet Take 2 tablets (4 mg total) by mouth 4 (four) times daily as needed for diarrhea or loose stools.   vitamin B-12 (CYANOCOBALAMIN) 1000 MCG tablet Take 1,000 mcg by mouth daily.     Allergies:   Codeine   Social History   Socioeconomic History   Marital status: Single    Spouse name: Not on file   Number of children: Not on file   Years of education: Not on file   Highest education level: Not on file  Occupational History   Not on file  Tobacco Use   Smoking status: Every Day    Current packs/day: 0.50    Types: Cigarettes   Smokeless tobacco: Never   Tobacco comments:    Smokes a PPD  Vaping Use   Vaping status: Every Day  Substance and Sexual Activity   Alcohol use: Yes   Drug use: Not Currently    Types: Cocaine, Marijuana    Comment: 15 days ago last  time cocaine, marijuana complted stop   Sexual activity: Not Currently  Other Topics Concern   Not on file  Social History Narrative   ** Merged History Encounter **       Social Drivers of Corporate investment banker Strain: Not on file  Food Insecurity: Not on file  Transportation Needs: Not on file  Physical Activity: Not on file  Stress: Not on file  Social Connections: Not on file     Family History: The patient's family history includes Diabetes in her maternal grandmother; Lung cancer in her maternal grandmother; Ovarian cancer in her mother. There is no history of Transient ischemic attack or Stroke.  ROS:   Please see the history of present illness.     All other systems reviewed and are  negative.  EKGs/Labs/Other Studies Reviewed:    The following studies were reviewed today:       Recent Labs: 10/09/2022: Magnesium 2.0 03/12/2023: ALT 91; BUN 8; Creatinine, Ser 0.58; Hemoglobin 12.7; Platelets 211; Potassium 3.4; Sodium 136  Recent Lipid Panel    Component Value Date/Time   CHOL 174 05/10/2021 0623   TRIG 51 05/10/2021 0623   HDL 66 05/10/2021 0623   CHOLHDL 2.6 05/10/2021 0623   VLDL 10 05/10/2021 0623   LDLCALC 98 05/10/2021 0623     Risk Assessment/Calculations:             Physical Exam:    VS:  BP 122/78   Pulse 97   Ht 5\' 3"  (1.6 m)   Wt 144 lb 3.2 oz (65.4 kg)   SpO2 95%   BMI 25.54 kg/m     Wt Readings from Last 3 Encounters:  04/01/23 144 lb 3.2 oz (65.4 kg)  03/12/23 139 lb 12.4 oz (63.4 kg)  11/20/22 139 lb 12.8 oz (63.4 kg)     GEN:  Well nourished, well developed in no acute distress HEENT: Normal NECK: No JVD; No carotid bruits CARDIAC: RRR, no murmurs, rubs, gallops RESPIRATORY: Diminished breath sounds, no wheezing ABDOMEN: Soft, non-tender, non-distended MUSCULOSKELETAL:  No edema; No deformity  SKIN: Warm and dry NEUROLOGIC:  Alert and oriented x 3 PSYCHIATRIC:  Normal affect   ASSESSMENT:    1. Syncope and collapse   2. Smoking   3. Shortness of breath    PLAN:    In order of problems listed above:  Syncope episode x 1, occasional dizziness with changing position.  Seems like positional vertigo.  Echo 2023 EF 55 to 60%.  Place cardiac monitor to rule out any significant arrhythmias.  If cardiac monitor or unremarkable, will recommend follow-up with PCP regarding management of vertigo. Current smoker, smoking cessation advised. Shortness of breath, echo with normal EF, does not appear to be angina.  Diminished breath sounds at bases, current smoker, likely have emphysema.  Uses albuterol as needed.  Follow-up with PCP regarding management and or consider pulmonary medicine.  Follow-up after cardiac monitor       Medication Adjustments/Labs and Tests Ordered: Current medicines are reviewed at length with the patient today.  Concerns regarding medicines are outlined above.  Orders Placed This Encounter  Procedures   LONG TERM MONITOR (3-14 DAYS)   No orders of the defined types were placed in this encounter.   Patient Instructions  Medication Instructions:   Your physician recommends that you continue on your current medications as directed. Please refer to the Current Medication list given to you today.  *If you need a refill on your cardiac  medications before your next appointment, please call your pharmacy*   Lab Work:  None Ordered  If you have labs (blood work) drawn today and your tests are completely normal, you will receive your results only by: MyChart Message (if you have MyChart) OR A paper copy in the mail If you have any lab test that is abnormal or we need to change your treatment, we will call you to review the results.   Testing/Procedures:  Your physician has recommended that you wear a Zio monitor.   This monitor is a medical device that records the heart's electrical activity. Doctors most often use these monitors to diagnose arrhythmias. Arrhythmias are problems with the speed or rhythm of the heartbeat. The monitor is a small device applied to your chest. You can wear one while you do your normal daily activities. While wearing this monitor if you have any symptoms to push the button and record what you felt. Once you have worn this monitor for the period of time provider prescribed (Usually 14 days), you will return the monitor device in the postage paid box. Once it is returned they will download the data collected and provide [place] with a report which the provider will then review and we will call you with those results. Important tips:  Avoid showering during the first 24 hours of wearing the monitor. Avoid excessive sweating to help maximize wear time. Do not  submerge the device, no hot tubs, and no swimming pools. Keep any lotions or oils away from the patch. After 24 hours you may shower with the patch on. Take brief showers with your back facing the shower head.  Do not remove patch once it has been placed because that will interrupt data and decrease adhesive wear time. Push the button when you have any symptoms and write down what you were feeling. Once you have completed wearing your monitor, remove and place into box which has postage paid and place in your outgoing mailbox.  If for some reason you have misplaced your box then call our office and we can provide another box and/or mail it off for you.      Follow-Up: At [facility], you and your health needs are our priority.  As part of our continuing mission to provide you with exceptional heart care, we have created designated Provider Care Teams.  These Care Teams include your primary Cardiologist (physician) and Advanced Practice Providers (APPs -  Physician Assistants and Nurse Practitioners) who all work together to provide you with the care you need, when you need it.  We recommend signing up for the patient portal called "MyChart".  Sign up information is provided on this After Visit Summary.  MyChart is used to connect with patients for Virtual Visits (Telemedicine).  Patients are able to view lab/test results, encounter notes, upcoming appointments, etc.  Non-urgent messages can be sent to your provider as well.   To learn more about what you can do with MyChart, go to [email or web address].    Your next appointment:   8 week(s)  Provider:   You may see Debbe Odea, MD or one of the following Advanced Practice Providers on your designated Care Team:   Nicolasa Ducking, NP Eula Listen, PA-C Cadence Fransico Michael, PA-C Charlsie Quest, NP Carlos Levering, NP    Signed, Debbe Odea, MD  04/01/2023 9:58 AM    [facility]

## 2023-04-01 NOTE — Patient Instructions (Signed)
 Medication Instructions:   Your physician recommends that you continue on your current medications as directed. Please refer to the Current Medication list given to you today.  *If you need a refill on your cardiac medications before your next appointment, please call your pharmacy*   Lab Work:  None Ordered  If you have labs (blood work) drawn today and your tests are completely normal, you will receive your results only by: MyChart Message (if you have MyChart) OR A paper copy in the mail If you have any lab test that is abnormal or we need to change your treatment, we will call you to review the results.   Testing/Procedures:  Your physician has recommended that you wear a Zio monitor.   This monitor is a medical device that records the heart's electrical activity. Doctors most often use these monitors to diagnose arrhythmias. Arrhythmias are problems with the speed or rhythm of the heartbeat. The monitor is a small device applied to your chest. You can wear one while you do your normal daily activities. While wearing this monitor if you have any symptoms to push the button and record what you felt. Once you have worn this monitor for the period of time provider prescribed (Usually 14 days), you will return the monitor device in the postage paid box. Once it is returned they will download the data collected and provide [place] with a report which the provider will then review and we will call you with those results. Important tips:  Avoid showering during the first 24 hours of wearing the monitor. Avoid excessive sweating to help maximize wear time. Do not submerge the device, no hot tubs, and no swimming pools. Keep any lotions or oils away from the patch. After 24 hours you may shower with the patch on. Take brief showers with your back facing the shower head.  Do not remove patch once it has been placed because that will interrupt data and decrease adhesive wear time. Push the button  when you have any symptoms and write down what you were feeling. Once you have completed wearing your monitor, remove and place into box which has postage paid and place in your outgoing mailbox.  If for some reason you have misplaced your box then call our office and we can provide another box and/or mail it off for you.      Follow-Up: At [facility], you and your health needs are our priority.  As part of our continuing mission to provide you with exceptional heart care, we have created designated Provider Care Teams.  These Care Teams include your primary Cardiologist (physician) and Advanced Practice Providers (APPs -  Physician Assistants and Nurse Practitioners) who all work together to provide you with the care you need, when you need it.  We recommend signing up for the patient portal called "MyChart".  Sign up information is provided on this After Visit Summary.  MyChart is used to connect with patients for Virtual Visits (Telemedicine).  Patients are able to view lab/test results, encounter notes, upcoming appointments, etc.  Non-urgent messages can be sent to your provider as well.   To learn more about what you can do with MyChart, go to [email or web address].    Your next appointment:   8 week(s)  Provider:   You may see Debbe Odea, MD or one of the following Advanced Practice Providers on your designated Care Team:   Nicolasa Ducking, NP Eula Listen, PA-C Cadence Fransico Michael, PA-C Charlsie Quest, NP Carlos Levering,  NP

## 2023-05-01 DIAGNOSIS — R55 Syncope and collapse: Secondary | ICD-10-CM | POA: Diagnosis not present

## 2023-05-02 ENCOUNTER — Encounter: Payer: Self-pay | Admitting: Emergency Medicine

## 2023-05-30 ENCOUNTER — Encounter: Payer: Self-pay | Admitting: Cardiology

## 2023-05-30 ENCOUNTER — Ambulatory Visit: Payer: 59 | Attending: Cardiology | Admitting: Cardiology

## 2023-05-30 VITALS — BP 130/78 | HR 76 | Ht 63.0 in | Wt 142.8 lb

## 2023-05-30 DIAGNOSIS — F17210 Nicotine dependence, cigarettes, uncomplicated: Secondary | ICD-10-CM

## 2023-05-30 DIAGNOSIS — R55 Syncope and collapse: Secondary | ICD-10-CM | POA: Diagnosis not present

## 2023-05-30 DIAGNOSIS — J439 Emphysema, unspecified: Secondary | ICD-10-CM | POA: Diagnosis not present

## 2023-05-30 MED ORDER — ASPIRIN 81 MG PO TBEC: 81.0000 mg | DELAYED_RELEASE_TABLET | Freq: Every day | ORAL | Status: AC

## 2023-05-30 NOTE — Patient Instructions (Signed)
 Medication Instructions:  Your physician recommends the following medication changes.  START TAKING: Aspirin  81 mg by mouth daily  *If you need a refill on your cardiac medications before your next appointment, please call your pharmacy*  Lab Work: No labs ordered today   Testing/Procedures: No test ordered today   Follow-Up: At [facility], you and your health needs are our priority.  As part of our continuing mission to provide you with exceptional heart care, our providers are all part of one team.  This team includes your primary Cardiologist (physician) and Advanced Practice Providers or APPs (Physician Assistants and Nurse Practitioners) who all work together to provide you with the care you need, when you need it.  Your next appointment:   As needed  Provider:   You may see Constancia Delton, MD or one of the following Advanced Practice Providers on your designated Care Team:   Laneta Pintos, NP Gildardo Labrador, PA-C Varney Gentleman, PA-C Cadence Del Rey, PA-C Ronald Cockayne, NP Morey Ar, NP    We recommend signing up for the patient portal called "MyChart".  Sign up information is provided on this After Visit Summary.  MyChart is used to connect with patients for Virtual Visits (Telemedicine).  Patients are able to view lab/test results, encounter notes, upcoming appointments, etc.  Non-urgent messages can be sent to your provider as well.   To learn more about what you can do with MyChart, go to [email or web address].

## 2023-05-30 NOTE — Progress Notes (Signed)
 Cardiology Office Note:    Date:  05/30/2023   ID:  WAVE CALZADA, DOB 1970/04/25, MRN [id number]  PCP:  Center, [facility]   [facility] Providers Cardiologist:  Constancia Delton, MD     Referring MD: Center, [facility]*   No chief complaint on file.   History of Present Illness:    Nicole Macdonald is a 53 y.o. 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  Last seen due to an episode of a dizziness and syncope.  Etiology.  Positional vertigo.  Cardiac monitor was placed to evaluate any significant arrhythmias.  Endorses shortness of breath, previous chest CT 12/23 has showed emphysema.  She still smokes.  Prior notes/testing Echo 05/2021 EF 55 to 60%, normal diastolic function.  Has a history of left-sided weakness back in 05/2021, workup with head CT showed acute left sigmoid sinus and left internal jugular vein thrombosis.  Coagulation workup was unrevealing.  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  Patient was managed with Eliquis .   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   aspirin   EC 81 MG tablet Take 1 tablet (81 mg total) by mouth daily. Swallow whole.   atomoxetine (STRATTERA) 60 MG capsule Take 60 mg by mouth at bedtime.   BIOTIN PO Take 1 tablet by mouth daily.   clonazePAM  (KLONOPIN ) 1 MG tablet Take 1 mg by mouth 3 (three) times daily.   clonazePAM  (KLONOPIN ) 2 MG tablet Take 2 mg by mouth 2 (two) times daily.   FLUoxetine  (PROZAC ) 40 MG capsule Take 40 mg by mouth daily.   fluticasone  (FLONASE ) 50 MCG/ACT nasal spray Place 2 sprays into both nostrils daily.   loperamide  (IMODIUM  A-D) 2 MG tablet Take 2 tablets (4 mg total) by mouth 4 (four) times daily as needed for diarrhea or loose stools.   naltrexone (DEPADE) 50 MG tablet Take by mouth.   propranolol (INDERAL) 10 MG tablet Take 10 mg by mouth.   vitamin B-12 (CYANOCOBALAMIN ) 1000 MCG tablet Take 1,000 mcg by mouth daily.     Allergies:   Codeine   Social History   Socioeconomic History   Marital status: Single    Spouse name: Not on file   Number of children: Not on file   Years of education: Not on file   Highest education level: Not on file  Occupational History   Not on file  Tobacco Use   Smoking status: Every Day    Current packs/day: 0.50    Types: Cigarettes   Smokeless tobacco: Never   Tobacco comments:  Smokes a PPD  Vaping Use   Vaping status: Every Day  Substance and Sexual Activity   Alcohol use: Yes   Drug use: Not Currently    Types: Cocaine, Marijuana    Comment: 15 days ago last time cocaine, marijuana complted stop   Sexual activity: Not Currently  Other Topics Concern   Not on file  Social History Narrative   ** Merged History Encounter **       Social Drivers of Corporate investment banker Strain: Not on file  Food Insecurity: Not on file  Transportation Needs: Not on file  Physical Activity: Not on file  Stress: Not on file  Social Connections: Not on file     Family History: The patient's family history includes Diabetes in her maternal grandmother;  Lung cancer in her maternal grandmother; Ovarian cancer in her mother. There is no history of Transient ischemic attack or Stroke.  ROS:   Please see the history of present illness.     All other systems reviewed and are negative.  EKGs/Labs/Other Studies Reviewed:    The following studies were reviewed today:  EKG Interpretation Date/Time:  Friday May 30 2023 14:03:30 EDT Ventricular Rate:  76 PR Interval:  166 QRS Duration:  62 QT Interval:  366 QTC Calculation: 411 R Axis:   61  Text Interpretation: Normal sinus rhythm Septal infarct (cited on or before 09-Oct-2022) Confirmed by Constancia Delton ([id number]) on 05/30/2023 2:08:51 PM    Recent Labs: 10/09/2022: Magnesium  2.0 03/12/2023: ALT 91; BUN 8; Creatinine, Ser 0.58; Hemoglobin 12.7; Platelets 211; Potassium 3.4; Sodium 136  Recent Lipid Panel    Component Value Date/Time   CHOL 174 05/10/2021 0623   TRIG 51 05/10/2021 0623   HDL 66 05/10/2021 0623   CHOLHDL 2.6 05/10/2021 0623   VLDL 10 05/10/2021 0623   LDLCALC 98 05/10/2021 0623     Risk Assessment/Calculations:             Physical Exam:    VS:  BP 130/78 (BP Location: Left Arm, Patient Position: Sitting, Cuff Size: Normal)   Pulse 76   Ht 5\' 3"  (1.6 m)   Wt 142 lb 12.8 oz (64.8 kg)   SpO2 98%   BMI 25.30 kg/m     Wt Readings from Last 3 Encounters:  05/30/23 142 lb 12.8 oz (64.8 kg)  04/01/23 144 lb 3.2 oz (65.4 kg)  03/12/23 139 lb 12.4 oz (63.4 kg)     GEN:  Well nourished, well developed in no acute distress HEENT: Normal NECK: No JVD; No carotid bruits CARDIAC: RRR, no murmurs, rubs, gallops RESPIRATORY: Diminished breath sounds, no wheezing ABDOMEN: Soft, non-tender, non-distended MUSCULOSKELETAL:  No edema; No deformity  SKIN: Warm and dry NEUROLOGIC:  Alert and oriented x 3 PSYCHIATRIC:  Normal affect   ASSESSMENT:    1. Syncope and collapse   2. Smoking greater than 40 pack years   3. Pulmonary emphysema, unspecified emphysema  type (HCC)    PLAN:    In order of problems listed above:  Syncope episode x 1, dizziness with changing positions suggesting positional vertigo.  Cardiac monitor 3/25 no significant or sustained arrhythmias.  No A-fib or flutter. Echo 2023 EF 55 to 60%.  Recommend follow-up with PCP for management of likely positional vertigo.  Consider ENT input. Current smoker, smoking cessation advised.  Chest CT 01/2022 with emphysema.  Start aspirin  81 mg daily. Emphysema, shortness of breath.  Will refer to pulmonary medicine.  Follow-up as needed.  Medication Adjustments/Labs and Tests Ordered: Current medicines are reviewed at length with the patient today.  Concerns regarding medicines are outlined above.  Orders Placed This Encounter  Procedures   Ambulatory referral to Pulmonology   EKG 12-Lead   Meds ordered this encounter  Medications   aspirin  EC 81 MG tablet    Sig: Take 1 tablet (81 mg total) by mouth daily. Swallow whole.    Patient Instructions  Medication Instructions:  Your physician recommends the following medication changes.  START TAKING: Aspirin  81 mg by mouth daily  *If you need a refill on your cardiac medications before your next appointment, please call your pharmacy*  Lab Work: No labs ordered today   Testing/Procedures: No test ordered today   Follow-Up: At [facility], you and your health needs are our priority.  As part of our continuing mission to provide you with exceptional heart care, our providers are all part of one team.  This team includes your primary Cardiologist (physician) and Advanced Practice Providers or APPs (Physician Assistants and Nurse Practitioners) who all work together to provide you with the care you need, when you need it.  Your next appointment:   As needed  Provider:   You may see Constancia Delton, MD or one of the following Advanced Practice Providers on your designated Care Team:   Laneta Pintos,  NP Gildardo Labrador, PA-C Varney Gentleman, PA-C Cadence Fostoria, PA-C Ronald Cockayne, NP Morey Ar, NP    We recommend signing up for the patient portal called "MyChart".  Sign up information is provided on this After Visit Summary.  MyChart is used to connect with patients for Virtual Visits (Telemedicine).  Patients are able to view lab/test results, encounter notes, upcoming appointments, etc.  Non-urgent messages can be sent to your provider as well.   To learn more about what you can do with MyChart, go to [email or web address].          Signed, Constancia Delton, MD  05/30/2023 2:51 PM    [facility]

## 2023-07-04 ENCOUNTER — Encounter: Payer: Self-pay | Admitting: Internal Medicine

## 2023-07-04 ENCOUNTER — Ambulatory Visit: Admitting: Internal Medicine

## 2023-07-04 VITALS — BP 110/80 | HR 90 | Temp 98.4°F | Ht 64.0 in | Wt 142.0 lb

## 2023-07-04 DIAGNOSIS — J449 Chronic obstructive pulmonary disease, unspecified: Secondary | ICD-10-CM | POA: Diagnosis not present

## 2023-07-04 DIAGNOSIS — F17200 Nicotine dependence, unspecified, uncomplicated: Secondary | ICD-10-CM

## 2023-07-04 DIAGNOSIS — F17210 Nicotine dependence, cigarettes, uncomplicated: Secondary | ICD-10-CM

## 2023-07-04 MED ORDER — FLUTICASONE-SALMETEROL 230-21 MCG/ACT IN AERO: 2.0000 | INHALATION_SPRAY | Freq: Two times a day (BID) | RESPIRATORY_TRACT | 12 refills | Status: DC

## 2023-07-04 MED ORDER — NICOTINE 21 MG/24HR TD PT24: 21.0000 mg | MEDICATED_PATCH | TRANSDERMAL | 1 refills | Status: AC

## 2023-07-04 NOTE — Patient Instructions (Signed)
 To assess for COPD we will obtain breathing test  Start Advair 2 puffs in the morning and 2 puffs at night Please rinse mouth after use   Please stop smoking  Recommend lung cancer screening referral program  Recommend checking oxygen levels at night

## 2023-07-04 NOTE — Progress Notes (Signed)
 [facility] [facility] Pulmonary Medicine Consultation      Date: 07/04/2023,   MRN# [id number] Nicole Macdonald 1970/06/16    Previous history reviewed in chart past medical history significant for alcohol and cocaine abuse who presented to the ER with symptoms of left-sided numbness and weakness as well as headache facial droop and dysphagia.  She had a CT and MRI brain as well as CT venogram head which showed left sigmoid sinus and internal jugular vein thrombosis.  As per her discharge summary there was no concern for any oropharyngeal infection that would be suggestive of septic thrombophlebitis.  Patient was discharged on Eliquis  and asked to follow-up with hematology and neurology.  She had a hypercoagulable work-up done which was essentially unremarkable including antiphospholipid antibody syndrome panel.  Homocystine levels are mildly elevated at 16.8.      CHIEF COMPLAINT:   ASSESSMENT OF COPD  HISTORY OF PRESENT ILLNESS    53 year old white female seen today for assessment of COPD Patient with extensive smoking history smokes since she was age of 53  Intermittent shortness of breath and dyspnea on exertion Intermittent wheezing with intermittent dry cough Patient is not on any type of inhalers at this time Patient continues to smoke  No exacerbation at this time No evidence of heart failure at this time No evidence or signs of infection at this time No respiratory distress No fevers, chills, nausea, vomiting, diarrhea No evidence of lower extremity edema No evidence hemoptysis  Patient denies drug use Patient does drink alcohol mostly wine Ambulating pulse oximetry in the office today reveals no significant levels of hypoxia O2 sat was 96% We will check oxygen levels at nighttime with overnight pulse oximetry  Smoking cessation strongly advised   PAST MEDICAL HISTORY   Past Medical History:  Diagnosis Date   Bipolar 1 disorder (HCC)    Bipolar disorder (HCC)     Internal jugular vein thrombosis (HCC)    2023   Manic depression (HCC)    Thrombosis    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    TIA (transient ischemic attack)      SURGICAL HISTORY   Past Surgical History:  Procedure Laterality Date   COMPRESSION HIP SCREW Left 04/19/2017   Procedure: left hip cannulated screw pinning;  Surgeon: Elner Hahn, MD;  Location: [facility] ORS;  Service: Orthopedics;  Laterality: Left;   denies       FAMILY HISTORY   Family History  Problem Relation Age of Onset   Ovarian cancer Mother    Diabetes Maternal Grandmother    Lung cancer Maternal Grandmother    Transient ischemic attack Neg Hx    Stroke Neg Hx      SOCIAL HISTORY   Social History   Tobacco Use   Smoking status: Every Day    Current packs/day: 0.50    Types: Cigarettes   Smokeless tobacco: Never   Tobacco comments:    Smokes a PPD  Vaping Use   Vaping status: Every Day  Substance Use Topics   Alcohol use: Yes   Drug use: Not Currently    Types: Cocaine, Marijuana    Comment: 15 days ago last time cocaine, marijuana complted stop     MEDICATIONS    Home Medication:  Current Outpatient Rx   Order #: [id number] Class: Print   Order #: [id number] Class: OTC   Order #: [id number] Class: Historical Med   Order #: [id number] Class: Historical Med   Order #: [id number] Class: Historical Med  Order #: [id number] Class: Historical Med   Order #: [id number] Class: Historical Med   Order #: [id number] Class: Historical Med   Order #: [id number] Class: Historical Med   Order #: [id number] Class: Print   Order #: [id number] Class: Historical Med   Order #: [id number] Class: Print   Order #: [id number] Class: Print   Order #: [id number] Class: Historical Med   Order #: [id number] Class: Historical Med    Current Medication:  Current Outpatient Medications:    albuterol  (VENTOLIN  HFA) 108 (90 Base) MCG/ACT inhaler, Inhale 2 puffs into the lungs every 6 (six)  hours as needed for wheezing or shortness of breath., Disp: 8 g, Rfl: 0   aspirin  EC 81 MG tablet, Take 1 tablet (81 mg total) by mouth daily. Swallow whole., Disp: , Rfl:    atomoxetine (STRATTERA) 60 MG capsule, Take 60 mg by mouth at bedtime., Disp: , Rfl:    BIOTIN PO, Take 1 tablet by mouth daily., Disp: , Rfl:    clonazePAM  (KLONOPIN ) 1 MG tablet, Take 1 mg by mouth 3 (three) times daily., Disp: , Rfl:    clonazePAM  (KLONOPIN ) 2 MG tablet, Take 2 mg by mouth 2 (two) times daily., Disp: , Rfl:    FLUoxetine  (PROZAC ) 40 MG capsule, Take 40 mg by mouth daily., Disp: , Rfl:    fluticasone  (FLONASE ) 50 MCG/ACT nasal spray, Place 2 sprays into both nostrils daily., Disp: , Rfl:    gabapentin (NEURONTIN) 600 MG tablet, Take 600 mg by mouth 4 (four) times daily. (Patient not taking: Reported on 05/30/2023), Disp: , Rfl:    loperamide  (IMODIUM  A-D) 2 MG tablet, Take 2 tablets (4 mg total) by mouth 4 (four) times daily as needed for diarrhea or loose stools., Disp: 30 tablet, Rfl: 0   naltrexone (DEPADE) 50 MG tablet, Take by mouth., Disp: , Rfl:    nicotine  (NICODERM CQ  - DOSED IN MG/24 HOURS) 21 mg/24hr patch, Place 1 patch (21 mg total) onto the skin daily. (Patient not taking: Reported on 05/30/2023), Disp: 28 patch, Rfl: 0   ondansetron  (ZOFRAN -ODT) 4 MG disintegrating tablet, Take 1 tablet (4 mg total) by mouth every 8 (eight) hours as needed for nausea or vomiting. (Patient not taking: Reported on 05/30/2023), Disp: 20 tablet, Rfl: 0   propranolol (INDERAL) 10 MG tablet, Take 10 mg by mouth., Disp: , Rfl:    vitamin B-12 (CYANOCOBALAMIN ) 1000 MCG tablet, Take 1,000 mcg by mouth daily., Disp: , Rfl:     ALLERGIES   Codeine  BP 110/80 (BP Location: Right Arm, Patient Position: Sitting, Cuff Size: Normal)   Pulse 90   Temp 98.4 F (36.9 C) (Oral)   Ht 5\' 4"  (1.626 m)   Wt 142 lb (64.4 kg)   SpO2 97%   BMI 24.37 kg/m    Review of Systems: Gen:  Denies  fever, sweats, chills weight loss   HEENT: Denies blurred vision, double vision, ear pain, eye pain, hearing loss, nose bleeds, sore throat Cardiac:  No dizziness, chest pain or heaviness, chest tightness,edema, No JVD Resp:   No cough, -sputum production, -shortness of breath,-wheezing, -hemoptysis,  Other:  All other systems negative   Physical Examination:   General Appearance: No distress  EYES PERRLA, EOM intact.   NECK Supple, No JVD Pulmonary: normal breath sounds, No wheezing.  CardiovascularNormal S1,S2.  No m/r/g.   Abdomen: Benign, Soft, non-tender. Neurology UE/LE 5/5 strength, no focal deficits Ext pulses intact, cap refill intact ALL OTHER ROS ARE NEGATIVE        ASSESSMENT/PLAN  53 year old pleasant white female seen today for assessment of COPD with extensive smoking history with a previous history of cocaine abuse alcohol use with a history of left sigmoid sinus and left internal jugular thrombosis   Assessment of COPD Recommend obtaining pulmonary function testing Recommend starting Advair 2 puffs in the morning 2 puffs at night Rinse mouth after use Avoid Allergens and Irritants Avoid secondhand smoke Avoid SICK contacts Recommend  Masking  when appropriate Recommend Keep up-to-date with vaccinations   Smoking Assessment and Cessation Counseling Upon further questioning, Patient smokes 1/2 ppd I have advised patient to quit/stop smoking as soon as possible due to high risk for multiple medical problems Patient is willing to quit smoking  I have advised patient that we can assist and have options of Nicotine  replacement therapy. I also advised patient on behavioral therapy and can provide oral medication therapy in conjunction with the other therapies Follow up next Office visit  for assessment of smoking cessation Smoking cessation counseling advised for >10 minutes  Extensive smoking history Patient high risk for lung cancer Recommend lung cancer screening protocol  referral   MEDICATION ADJUSTMENTS/LABS AND TESTS ORDERED: Pulmonary function testing Advair twice daily Smoking cessation Lung cancer screening program Check overnight pulse oximetry   CURRENT MEDICATIONS REVIEWED AT LENGTH WITH PATIENT TODAY   Patient  satisfied with Plan of action and management. All questions answered  Follow up 6 weeks  I spent a total of 61 minutes reviewing chart data, face-to-face evaluation with the patient, counseling and coordination of care as detailed above.     Lady Pier, M.D.  [facility] Pulmonary & Critical Care Medicine  Medical Director [facility] Medical Director [facility] Cardio-Pulmonary Department

## 2023-07-24 ENCOUNTER — Telehealth: Payer: Self-pay

## 2023-07-24 NOTE — Telephone Encounter (Signed)
ONO reviewed by Dr. Belia Heman- Low SpO2 88%. No oxygen needed.  I have notified the patient. Nothing further needed.

## 2023-09-24 ENCOUNTER — Ambulatory Visit: Admitting: Internal Medicine

## 2023-09-24 ENCOUNTER — Encounter

## 2023-11-20 ENCOUNTER — Ambulatory Visit

## 2023-11-20 ENCOUNTER — Encounter: Payer: Self-pay | Admitting: Internal Medicine

## 2023-11-20 ENCOUNTER — Ambulatory Visit: Admitting: Internal Medicine

## 2023-11-20 VITALS — BP 120/80 | HR 63 | Temp 99.0°F | Ht 64.0 in | Wt 140.6 lb

## 2023-11-20 DIAGNOSIS — F17210 Nicotine dependence, cigarettes, uncomplicated: Secondary | ICD-10-CM

## 2023-11-20 DIAGNOSIS — F17200 Nicotine dependence, unspecified, uncomplicated: Secondary | ICD-10-CM

## 2023-11-20 DIAGNOSIS — J449 Chronic obstructive pulmonary disease, unspecified: Secondary | ICD-10-CM

## 2023-11-20 LAB — PULMONARY FUNCTION TEST
DL/VA % pred: 104 %
DL/VA: 4.43 ml/min/mmHg/L
DLCO unc % pred: 111 %
DLCO unc: 22.92 ml/min/mmHg
FEF 25-75 Post: 2.75 L/s
FEF 25-75 Pre: 2.61 L/s
FEF2575-%Change-Post: 5 %
FEF2575-%Pred-Post: 107 %
FEF2575-%Pred-Pre: 101 %
FEV1-%Change-Post: 0 %
FEV1-%Pred-Post: 100 %
FEV1-%Pred-Pre: 100 %
FEV1-Post: 2.7 L
FEV1-Pre: 2.7 L
FEV1FVC-%Change-Post: -1 %
FEV1FVC-%Pred-Pre: 100 %
FEV6-%Change-Post: 1 %
FEV6-%Pred-Post: 103 %
FEV6-%Pred-Pre: 102 %
FEV6-Post: 3.45 L
FEV6-Pre: 3.4 L
FEV6FVC-%Change-Post: 0 %
FEV6FVC-%Pred-Post: 102 %
FEV6FVC-%Pred-Pre: 103 %
FVC-%Change-Post: 1 %
FVC-%Pred-Post: 100 %
FVC-%Pred-Pre: 99 %
FVC-Post: 3.46 L
FVC-Pre: 3.41 L
Post FEV1/FVC ratio: 78 %
Post FEV6/FVC ratio: 100 %
Pre FEV1/FVC ratio: 79 %
Pre FEV6/FVC Ratio: 100 %
RV % pred: 115 %
RV: 2.19 L
TLC % pred: 111 %
TLC: 5.66 L

## 2023-11-20 MED ORDER — FLUTICASONE-SALMETEROL 230-21 MCG/ACT IN AERO: 2.0000 | INHALATION_SPRAY | Freq: Two times a day (BID) | RESPIRATORY_TRACT | 12 refills | Status: AC

## 2023-11-20 MED ORDER — NICOTINE 21 MG/24HR TD PT24: 21.0000 mg | MEDICATED_PATCH | TRANSDERMAL | 1 refills | Status: AC

## 2023-11-20 NOTE — Patient Instructions (Signed)
 Full PFT completed today ? ?

## 2023-11-20 NOTE — Patient Instructions (Signed)
 Please stop smoking Start Advair twice a day Please rinse mouth after use Reestablish lung cancer screening program  Avoid Allergens and Irritants Avoid secondhand smoke Avoid SICK contacts Recommend  Masking  when appropriate Recommend Keep up-to-date with vaccinations

## 2023-11-20 NOTE — Progress Notes (Signed)
 [facility] Pulmonary Medicine Consultation      Date: 11/20/2023,   MRN# [id number] Nicole Macdonald March 28, 1970    Previous history reviewed in chart past medical history significant for alcohol and cocaine abuse who presented to the ER with symptoms of left-sided numbness and weakness as well as headache facial droop and dysphagia.  She had a CT and MRI brain as well as CT venogram head which showed left sigmoid sinus and internal jugular vein thrombosis.  As per her discharge summary there was no concern for any oropharyngeal infection that would be suggestive of septic thrombophlebitis.  Patient was discharged on Eliquis  and asked to follow-up with hematology and neurology.  She had a hypercoagulable work-up done which was essentially unremarkable including antiphospholipid antibody syndrome panel.  Homocystine levels are mildly elevated at 16.8.   MAY 2025 Ambulating pulse oximetry in the office  reveals no significant levels of hypoxia O2 sat was 96%  June 2025 ONO -no significant hypoxic episodes  October 2025 Pulmonary function testing  Postbronchodilator FEV1 FVC ratio is 78% predicted FEV1 100% predicted FVC 100% predicted No significant bronchodilator response TLC is 111% predicted RV is 115% predicted RV/TLC ratio is 103% predicted DLCO is 111% predicted Flow volume loops show a possible obstructive pattern in the expiratory limb Overall impression minimal small airways obstructive disease relayed to patient in detail  CHIEF COMPLAINT:   Follow-up assessment for COPD  HISTORY OF PRESENT ILLNESS    53 year old white female seen today for assessment of COPD Patient with extensive smoking history smokes since she was age of 7  No exacerbation at this time No evidence of heart failure at this time No evidence or signs of infection at this time No respiratory distress No fevers, chills, nausea, vomiting, diarrhea No evidence of lower extremity edema No evidence  hemoptysis Pulmonary function test explained to patient in detail Small obstructive airways disease I have explained to her that smoking will progress her symptoms and breathing will get worse  Patient states her phone was not working therefore did not receive any messages for lung cancer screening program This will need to be reestablished patient is at high risk for cancer   Patient denies drug use Patient does drink alcohol mostly wine Smoking cessation strongly advised   PAST MEDICAL HISTORY   Past Medical History:  Diagnosis Date   Bipolar 1 disorder (HCC)    Bipolar disorder (HCC)    Internal jugular vein thrombosis (HCC)    2023   Manic depression (HCC)    Thrombosis    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    TIA (transient ischemic attack)      SURGICAL HISTORY   Past Surgical History:  Procedure Laterality Date   COMPRESSION HIP SCREW Left 04/19/2017   Procedure: left hip cannulated screw pinning;  Surgeon: Edie Norleen PARAS, MD;  Location: [facility] ORS;  Service: Orthopedics;  Laterality: Left;   denies       FAMILY HISTORY   Family History  Problem Relation Age of Onset   Ovarian cancer Mother    Diabetes Maternal Grandmother    Lung cancer Maternal Grandmother    Transient ischemic attack Neg Hx    Stroke Neg Hx      SOCIAL HISTORY   Social History   Tobacco Use   Smoking status: Every Day    Current packs/day: 0.50    Types: Cigarettes   Smokeless tobacco: Never   Tobacco comments:    Smokes  a PPD  Vaping Use   Vaping status: Every Day  Substance Use Topics   Alcohol use: Yes   Drug use: Not Currently    Types: Cocaine, Marijuana    Comment: 15 days ago last time cocaine, marijuana complted stop     MEDICATIONS    Home Medication:  Current Outpatient Rx   Order #: [id number] Class: Historical Med   Order #: [id number] Class: Historical Med   Order #: [id number] Class: Historical Med   Order #:  [id number] Class: Historical Med   Order #: [id number] Class: Print   Order #: [id number] Class: OTC   Order #: [id number] Class: Historical Med   Order #: [id number] Class: Historical Med   Order #: [id number] Class: Historical Med   Order #: [id number] Class: Historical Med   Order #: [id number] Class: Historical Med   Order #: [id number] Class: Historical Med   Order #: [id number] Class: Normal   Order #: [id number] Class: Historical Med   Order #: [id number] Class: Print   Order #: [id number] Class: Historical Med   Order #: [id number] Class: Print   Order #: [id number] Class: Normal   Order #: [id number] Class: Print   Order #: [id number] Class: Historical Med   Order #: [id number] Class: Historical Med    Current Medication:  Current Outpatient Medications:    doxepin (SINEQUAN) 10 MG capsule, Take 10 mg by mouth at bedtime., Disp: , Rfl:    mupirocin  ointment (BACTROBAN ) 2 %, Apply 1 Application topically 3 (three) times daily., Disp: , Rfl:    propranolol (INDERAL) 20 MG tablet, Take 20 mg by mouth 3 (three) times daily., Disp: , Rfl:    sulfamethoxazole-trimethoprim (BACTRIM DS) 800-160 MG tablet, Take 1 tablet by mouth 2 (two) times daily., Disp: , Rfl:    albuterol  (VENTOLIN  HFA) 108 (90 Base) MCG/ACT inhaler, Inhale 2 puffs into the lungs every 6 (six) hours as needed for wheezing or shortness of breath., Disp: 8 g, Rfl: 0   aspirin  EC 81 MG tablet, Take 1 tablet (81 mg total) by mouth daily. Swallow whole., Disp: , Rfl:    atomoxetine (STRATTERA) 60 MG capsule, Take 60 mg by mouth at bedtime., Disp: , Rfl:    BIOTIN PO, Take 1 tablet by mouth daily. (Patient not taking: Reported on 07/04/2023), Disp: , Rfl:    clonazePAM  (KLONOPIN ) 1 MG tablet, Take 1 mg by mouth 3 (three) times daily. (Patient not taking: Reported on 07/04/2023), Disp: , Rfl:    clonazePAM  (KLONOPIN ) 2 MG tablet, Take 2 mg by mouth 2 (two) times daily. (Patient not taking: Reported on 07/04/2023), Disp: , Rfl:    FLUoxetine  (PROZAC ) 40 MG  capsule, Take 40 mg by mouth daily., Disp: , Rfl:    fluticasone  (FLONASE ) 50 MCG/ACT nasal spray, Place 2 sprays into both nostrils daily. (Patient not taking: Reported on 07/04/2023), Disp: , Rfl:    fluticasone -salmeterol (ADVAIR HFA) 230-21 MCG/ACT inhaler, Inhale 2 puffs into the lungs 2 (two) times daily., Disp: 1 each, Rfl: 12   gabapentin (NEURONTIN) 600 MG tablet, Take 600 mg by mouth 4 (four) times daily. (Patient not taking: Reported on 05/30/2023), Disp: , Rfl:    loperamide  (IMODIUM  A-D) 2 MG tablet, Take 2 tablets (4 mg total) by mouth 4 (four) times daily as needed for diarrhea or loose stools. (Patient not taking: Reported on 07/04/2023), Disp: 30 tablet, Rfl: 0   naltrexone (DEPADE) 50 MG tablet, Take by mouth., Disp: , Rfl:    nicotine  (NICODERM CQ  - DOSED IN MG/24 HOURS) 21 mg/24hr patch, Place 1 patch (21 mg total) onto the skin daily. (  Patient not taking: Reported on 05/30/2023), Disp: 28 patch, Rfl: 0   nicotine  (NICODERM CQ  - DOSED IN MG/24 HOURS) 21 mg/24hr patch, Place 1 patch (21 mg total) onto the skin daily., Disp: 30 patch, Rfl: 1   ondansetron  (ZOFRAN -ODT) 4 MG disintegrating tablet, Take 1 tablet (4 mg total) by mouth every 8 (eight) hours as needed for nausea or vomiting. (Patient not taking: Reported on 07/04/2023), Disp: 20 tablet, Rfl: 0   oxybutynin (DITROPAN-XL) 5 MG 24 hr tablet, Take 5 mg by mouth daily., Disp: , Rfl:    vitamin B-12 (CYANOCOBALAMIN ) 1000 MCG tablet, Take 1,000 mcg by mouth daily., Disp: , Rfl:     ALLERGIES   Pollen extract and Codeine  BP 120/80   Pulse 63   Temp 99 F (37.2 C)   Ht 5' 4 (1.626 m)   Wt 140 lb 9.6 oz (63.8 kg)   SpO2 99%   BMI 24.13 kg/m        Review of Systems: Gen:  Denies  fever, sweats, chills weight loss  HEENT: Denies blurred vision, double vision, ear pain, eye pain, hearing loss, nose bleeds, sore throat Cardiac:  No dizziness, chest pain or heaviness, chest tightness,edema, No JVD Resp:   No cough,  -sputum production, +shortness of breath,-wheezing, -hemoptysis,  Other:  All other systems negative   Physical Examination:   General Appearance: No distress  EYES PERRLA, EOM intact.   NECK Supple, No JVD Pulmonary: normal breath sounds, No wheezing.  CardiovascularNormal S1,S2.  No m/r/g.   Abdomen: Benign, Soft, non-tender. Neurology UE/LE 5/5 strength, no focal deficits Ext pulses intact, cap refill intact ALL OTHER ROS ARE NEGATIVE    ASSESSMENT/PLAN   53 year old pleasant white female seen today for assessment of COPD with extensive smoking history with a previous history of cocaine abuse alcohol use with a history of left sigmoid sinus and left internal jugular thrombosis, pulmonary function testing confirms the diagnosis of probable underlying mild emphysema and COPD with small obstructive airways disease however I have explained to patient that progression of disease can happen if she continues to smoke, is also high risk for cancer   Assessment of COPD Recommend starting Advair 2 puffs in the morning 2 puffs at night Patient states that her pharmacy did not receive medications however patient did not call us  to let us  know that Prescription sent to [facility] pharmacy as requested by patient Rinse mouth after use Avoid Allergens and Irritants Avoid secondhand smoke Avoid SICK contacts Recommend  Masking  when appropriate Recommend Keep up-to-date with vaccinations   Smoking Assessment and Cessation Counseling Upon further questioning, Patient smokes 1 ppd I have advised patient to quit/stop smoking as soon as possible due to high risk for multiple medical problems Patient is willing to quit smoking  I have advised patient that we can assist and have options of Nicotine  replacement therapy. I also advised patient on behavioral therapy and can provide oral medication therapy in conjunction with the other therapies Follow up next Office visit  for assessment of smoking  cessation Smoking cessation counseling advised for 4 minutes Nicotine  patches ordered   Extensive smoking history Patient high risk for lung cancer Recommend reestablish lung cancer screening protocol referral Patient states her phone was not working and did not receive any messages Multiple attempts were made to call patient to establish low-dose CT program  MEDICATION ADJUSTMENTS/LABS AND TESTS ORDERED: Advair twice daily Rinse mouth after use Smoking cessation Lung cancer screening program Avoid Allergens and  Irritants Avoid secondhand smoke Avoid SICK contacts Recommend  Masking  when appropriate Recommend Keep up-to-date with vaccinations   CURRENT MEDICATIONS REVIEWED AT LENGTH WITH PATIENT TODAY   Patient  satisfied with Plan of action and management. All questions answered   Follow up 6 months   I spent a total of 51 minutes dedicated to the care of this patient on the date of this encounter to include pre-visit review of records, face-to-face time with the patient discussing conditions above, post visit ordering of testing, clinical documentation with the electronic health record, making appropriate referrals as documented, and communicating necessary information to the patient's healthcare team.    The Patient requires high complexity decision making for assessment and support, frequent evaluation and titration of therapies, application of advanced monitoring technologies and extensive interpretation of multiple databases.  Patient satisfied with Plan of action and management. All questions answered    Nickolas Alm Cellar, M.D.  [facility] Pulmonary & Critical Care Medicine  Medical Director [facility]

## 2023-11-20 NOTE — Progress Notes (Signed)
 Full PFT completed today ? ?

## 2023-11-21 ENCOUNTER — Other Ambulatory Visit (HOSPITAL_COMMUNITY): Payer: Self-pay

## 2023-11-21 ENCOUNTER — Telehealth: Payer: Self-pay

## 2023-11-21 NOTE — Telephone Encounter (Signed)
*  Pulm  Pharmacy Patient Advocate Encounter   Received notification from Fax that prior authorization for Advair HFA is required/requested.   Insurance verification completed.   The patient is insured through [facility].   Per test claim:  Brand Ranell Halim, Generic Advair Diskus, Dulera, Brand Symbicort is preferred by the insurance.  If suggested medication is appropriate, Please send in a new RX and discontinue this one. If not, please advise as to why it's not appropriate so that we may request a Prior Authorization. Please note, some preferred medications may still require a PA.  If the suggested medications have not been trialed and there are no contraindications to their use, the PA will not be submitted, as it will not be approved.   Brand Breo Ellipta-$0.00 Generic Advair Diskus- $0.00 Dulera- $0.00 Brand Symbicort- $0.00

## 2023-11-24 ENCOUNTER — Telehealth: Payer: Self-pay

## 2023-11-24 NOTE — Telephone Encounter (Signed)
 Copied from CRM #[id number]. Topic: Appointments - Appointment Scheduling >> Nov 24, 2023  4:36 PM Lavanda D wrote: Patient/patient representative is calling to schedule an appointment. Refer to attachments for appointment information. Patient referred to see NP Lauraine Lites specifically + mentioned lung cancer screening, called Denise's line - No answer, left VM for callback.   Per lov 10/16 looks like pt needs to reestablish with lung cancer screening program  Please advise.

## 2023-11-25 ENCOUNTER — Other Ambulatory Visit: Payer: Self-pay | Admitting: Internal Medicine

## 2023-11-25 ENCOUNTER — Other Ambulatory Visit: Payer: Self-pay

## 2023-11-25 DIAGNOSIS — D539 Nutritional anemia, unspecified: Secondary | ICD-10-CM

## 2023-11-25 DIAGNOSIS — J449 Chronic obstructive pulmonary disease, unspecified: Secondary | ICD-10-CM

## 2023-11-25 DIAGNOSIS — G08 Intracranial and intraspinal phlebitis and thrombophlebitis: Secondary | ICD-10-CM

## 2023-11-25 MED ORDER — MOMETASONE FURO-FORMOTEROL FUM 200-5 MCG/ACT IN AERO
2.0000 | INHALATION_SPRAY | Freq: Two times a day (BID) | RESPIRATORY_TRACT | 5 refills | Status: AC
Start: 2023-11-25 — End: ?

## 2023-11-25 NOTE — Progress Notes (Signed)
 Patient insurance did not cover advair will order Dulera

## 2023-11-25 NOTE — Telephone Encounter (Signed)
 Attempted to contact pt. Left voicemail for pt to call regarding lung screening referral.

## 2023-11-26 ENCOUNTER — Encounter: Payer: Self-pay | Admitting: Oncology

## 2023-11-26 ENCOUNTER — Inpatient Hospital Stay: Payer: 59 | Attending: Oncology

## 2023-11-26 ENCOUNTER — Inpatient Hospital Stay: Payer: 59 | Admitting: Oncology

## 2023-11-26 NOTE — Telephone Encounter (Signed)
 Noted, NFN

## 2023-11-26 NOTE — Telephone Encounter (Signed)
 Attempted to call pt to notify of new medication being available but call will not go through.

## 2023-11-28 NOTE — Telephone Encounter (Signed)
 LMTCB. E2C2 please advise when patient calls back.

## 2023-12-01 ENCOUNTER — Telehealth: Payer: Self-pay | Admitting: *Deleted

## 2023-12-01 ENCOUNTER — Other Ambulatory Visit: Payer: Self-pay | Admitting: *Deleted

## 2023-12-01 DIAGNOSIS — F17210 Nicotine dependence, cigarettes, uncomplicated: Secondary | ICD-10-CM

## 2023-12-01 DIAGNOSIS — Z122 Encounter for screening for malignant neoplasm of respiratory organs: Secondary | ICD-10-CM

## 2023-12-01 DIAGNOSIS — Z87891 Personal history of nicotine dependence: Secondary | ICD-10-CM

## 2023-12-01 NOTE — Telephone Encounter (Signed)
 Lung Cancer Screening Narrative/Criteria Questionnaire (Cigarette Smokers Only- No Cigars/Pipes/vapes)   Nicole Macdonald   SDMV:12/22/23 12:00- Kristen                                           01/11/1971              LDCT: 12/23/23 2:00- [facility]    53 y.o.   Phone: [phone]  Lung Screening Narrative (confirm age 48-77 yrs Medicare / 50-80 yrs Private pay insurance)   Insurance information:[employer]   Referring Provider:Kasa   This screening involves an initial phone call with a team member from our program. It is called a shared decision making visit. The initial meeting is required by insurance and Medicare to make sure you understand the program. This appointment takes about 15-20 minutes to complete. The CT scan will completed at a separate date/time. This scan takes about 5-10 minutes to complete and you may eat and drink before and after the scan.  Criteria questions for Lung Cancer Screening:   Are you a current or former smoker? Current Age began smoking: 13   If you are a former smoker, what year did you quit smoking? (within 15 yrs)   To calculate your smoking history, I need an accurate estimate of how many packs of cigarettes you smoked per day and for how many years. (Not just the number of PPD you are now smoking)   Years smoking 40 x Packs per day 1/2- 1.5 = Pack years 20   (at least 20 pack yrs)   (Make sure they understand that we need to know how much they have smoked in the past, not just the number of PPD they are smoking now)  Do you have a personal history of cancer?  No    Do you have a family history of cancer? Yes  (cancer type and and relative) Mother (ovarian) MGM (lung)  Are you coughing up blood?  No  Have you had unexplained weight loss of 15 lbs or more in the last 6 months? No  It looks like you meet all criteria.     Additional information: N/A

## 2023-12-02 NOTE — Telephone Encounter (Signed)
 Lm x2 for the patient. I have mailed her a letter as well.  Nothing further needed.

## 2023-12-15 ENCOUNTER — Ambulatory Visit: Payer: Self-pay | Admitting: Internal Medicine

## 2023-12-22 ENCOUNTER — Encounter: Payer: Self-pay | Admitting: *Deleted

## 2023-12-22 ENCOUNTER — Ambulatory Visit (INDEPENDENT_AMBULATORY_CARE_PROVIDER_SITE_OTHER): Admitting: *Deleted

## 2023-12-22 DIAGNOSIS — F17210 Nicotine dependence, cigarettes, uncomplicated: Secondary | ICD-10-CM | POA: Insufficient documentation

## 2023-12-22 NOTE — Patient Instructions (Signed)

## 2023-12-22 NOTE — Progress Notes (Signed)
  Virtual Visit via Telephone Note  I connected with Nicole Macdonald on 12/22/23 at 12:00 PM EST by telephone and verified that I am speaking with the correct person using two identifiers.  Location: Patient: in home Provider: [address] [place], [place], [address]    Shared Decision Making Visit Lung Cancer Screening Program [id number])   Eligibility: Age 53 y.o. Pack Years Smoking History Calculation 20 (# packs/per year x # years smoked) Recent History of coughing up blood  no Unexplained weight loss? no ( >Than 15 pounds within the last 6 months ) Prior History Lung / other cancer no (Diagnosis within the last 5 years already requiring surveillance chest CT Scans). Smoking Status Current Smoker Former Smokers: Years since quit:  NA  Quit Date: NA  Visit Components: Discussion included one or more decision making aids. yes Discussion included risk/benefits of screening. yes Discussion included potential follow up diagnostic testing for abnormal scans. yes Discussion included meaning and risk of over diagnosis. yes Discussion included meaning and risk of False Positives. yes Discussion included meaning of total radiation exposure. yes  Counseling Included: Importance of adherence to annual lung cancer LDCT screening. yes Impact of comorbidities on ability to participate in the program. yes Ability and willingness to under diagnostic treatment. yes  Smoking Cessation Counseling: Current Smokers:  Discussed importance of smoking cessation. yes Information about tobacco cessation classes and interventions provided to patient. yes Patient provided with ticket for LDCT Scan. yes Symptomatic Patient. no  Counseling NA Diagnosis Code: Tobacco Use Z72.0 Asymptomatic Patient yes  Counseling (Intermediate counseling: > three minutes counseling) H9563  Counseled patient 4 minutes regarding tobacco use.   Former Smokers:  Discussed the importance of maintaining  cigarette abstinence. yes Diagnosis Code: Personal History of Nicotine  Dependence. S12.108 Information about tobacco cessation classes and interventions provided to patient. Yes Patient provided with ticket for LDCT Scan. yes Written Order for Lung Cancer Screening with LDCT placed in Epic. Yes (CT Chest Lung Cancer Screening Low Dose W/O CM) [id number] Z12.2-Screening of respiratory organs Z87.891-Personal history of nicotine  dependence   Josette Ranger, RN 12/22/23

## 2023-12-23 ENCOUNTER — Ambulatory Visit
Admission: RE | Admit: 2023-12-23 | Discharge: 2023-12-23 | Disposition: A | Discharge status: Home / Self Care | Source: Ambulatory Visit | Attending: Acute Care | Admitting: Acute Care

## 2023-12-23 DIAGNOSIS — Z87891 Personal history of nicotine dependence: Secondary | ICD-10-CM | POA: Diagnosis present

## 2023-12-23 DIAGNOSIS — F17210 Nicotine dependence, cigarettes, uncomplicated: Secondary | ICD-10-CM | POA: Diagnosis present

## 2023-12-23 DIAGNOSIS — Z122 Encounter for screening for malignant neoplasm of respiratory organs: Secondary | ICD-10-CM | POA: Diagnosis present

## 2023-12-31 ENCOUNTER — Other Ambulatory Visit: Payer: Self-pay

## 2023-12-31 ENCOUNTER — Other Ambulatory Visit: Payer: Self-pay | Admitting: Acute Care

## 2023-12-31 DIAGNOSIS — Z122 Encounter for screening for malignant neoplasm of respiratory organs: Secondary | ICD-10-CM

## 2023-12-31 DIAGNOSIS — Z87891 Personal history of nicotine dependence: Secondary | ICD-10-CM

## 2023-12-31 DIAGNOSIS — F17210 Nicotine dependence, cigarettes, uncomplicated: Secondary | ICD-10-CM
